# Patient Record
Sex: Male | Born: 1940
Health system: Southern US, Community
[De-identification: ages and names within clinical notes are randomized; demographics above are authoritative.]

## PROBLEM LIST (undated history)

## (undated) DIAGNOSIS — F329 Major depressive disorder, single episode, unspecified: Secondary | ICD-10-CM

## (undated) DIAGNOSIS — R0600 Dyspnea, unspecified: Secondary | ICD-10-CM

## (undated) DIAGNOSIS — M1990 Unspecified osteoarthritis, unspecified site: Secondary | ICD-10-CM

## (undated) DIAGNOSIS — Z87442 Personal history of urinary calculi: Secondary | ICD-10-CM

## (undated) DIAGNOSIS — J189 Pneumonia, unspecified organism: Secondary | ICD-10-CM

## (undated) DIAGNOSIS — F419 Anxiety disorder, unspecified: Secondary | ICD-10-CM

## (undated) DIAGNOSIS — J449 Chronic obstructive pulmonary disease, unspecified: Secondary | ICD-10-CM

## (undated) DIAGNOSIS — C801 Malignant (primary) neoplasm, unspecified: Secondary | ICD-10-CM

## (undated) DIAGNOSIS — G4730 Sleep apnea, unspecified: Secondary | ICD-10-CM

## (undated) DIAGNOSIS — F32A Depression, unspecified: Secondary | ICD-10-CM

## (undated) DIAGNOSIS — E785 Hyperlipidemia, unspecified: Secondary | ICD-10-CM

## (undated) HISTORY — DX: Hyperlipidemia, unspecified: E78.5

## (undated) HISTORY — DX: Chronic obstructive pulmonary disease, unspecified: J44.9

## (undated) HISTORY — PX: MOUTH SURGERY: SHX715

## (undated) HISTORY — PX: COLONOSCOPY W/ POLYPECTOMY: SHX1380

## (undated) HISTORY — DX: Sleep apnea, unspecified: G47.30

---

## 2004-08-20 ENCOUNTER — Ambulatory Visit: Payer: Self-pay | Admitting: Internal Medicine

## 2004-09-10 ENCOUNTER — Ambulatory Visit: Payer: Self-pay | Admitting: Internal Medicine

## 2004-09-23 ENCOUNTER — Ambulatory Visit: Payer: Self-pay | Admitting: Internal Medicine

## 2004-09-24 ENCOUNTER — Ambulatory Visit: Payer: Self-pay | Admitting: Internal Medicine

## 2004-10-01 ENCOUNTER — Ambulatory Visit: Payer: Self-pay | Admitting: Internal Medicine

## 2004-10-25 ENCOUNTER — Ambulatory Visit: Payer: Self-pay | Admitting: Internal Medicine

## 2004-11-12 ENCOUNTER — Ambulatory Visit: Payer: Self-pay | Admitting: Internal Medicine

## 2004-11-30 ENCOUNTER — Ambulatory Visit: Payer: Self-pay | Admitting: Internal Medicine

## 2004-12-29 ENCOUNTER — Ambulatory Visit: Payer: Self-pay | Admitting: Internal Medicine

## 2005-01-21 ENCOUNTER — Ambulatory Visit: Payer: Self-pay | Admitting: Internal Medicine

## 2005-02-04 ENCOUNTER — Ambulatory Visit: Payer: Self-pay | Admitting: Internal Medicine

## 2005-02-11 ENCOUNTER — Ambulatory Visit: Payer: Self-pay | Admitting: Internal Medicine

## 2005-02-25 ENCOUNTER — Ambulatory Visit: Payer: Self-pay | Admitting: Internal Medicine

## 2005-03-28 ENCOUNTER — Ambulatory Visit: Payer: Self-pay | Admitting: Internal Medicine

## 2005-04-05 ENCOUNTER — Ambulatory Visit: Payer: Self-pay | Admitting: Internal Medicine

## 2005-04-12 ENCOUNTER — Ambulatory Visit: Payer: Self-pay | Admitting: Internal Medicine

## 2005-04-20 ENCOUNTER — Ambulatory Visit: Payer: Self-pay | Admitting: Internal Medicine

## 2005-05-04 ENCOUNTER — Ambulatory Visit: Payer: Self-pay | Admitting: Internal Medicine

## 2005-05-13 ENCOUNTER — Ambulatory Visit: Payer: Self-pay | Admitting: Internal Medicine

## 2005-05-25 ENCOUNTER — Ambulatory Visit: Payer: Self-pay | Admitting: Internal Medicine

## 2005-06-13 ENCOUNTER — Ambulatory Visit: Payer: Self-pay | Admitting: Internal Medicine

## 2005-06-14 ENCOUNTER — Ambulatory Visit: Payer: Self-pay | Admitting: Internal Medicine

## 2005-07-04 ENCOUNTER — Ambulatory Visit: Payer: Self-pay | Admitting: Internal Medicine

## 2005-07-19 ENCOUNTER — Ambulatory Visit: Payer: Self-pay | Admitting: Internal Medicine

## 2005-08-22 ENCOUNTER — Ambulatory Visit: Payer: Self-pay | Admitting: Internal Medicine

## 2005-09-06 ENCOUNTER — Ambulatory Visit: Payer: Self-pay | Admitting: Internal Medicine

## 2005-09-20 ENCOUNTER — Ambulatory Visit: Payer: Self-pay | Admitting: Internal Medicine

## 2005-09-30 ENCOUNTER — Ambulatory Visit: Payer: Self-pay | Admitting: Internal Medicine

## 2005-10-10 ENCOUNTER — Ambulatory Visit: Payer: Self-pay | Admitting: Internal Medicine

## 2005-10-31 ENCOUNTER — Ambulatory Visit: Payer: Self-pay | Admitting: Internal Medicine

## 2005-11-08 ENCOUNTER — Ambulatory Visit: Payer: Self-pay | Admitting: Internal Medicine

## 2005-11-24 ENCOUNTER — Ambulatory Visit: Payer: Self-pay | Admitting: Internal Medicine

## 2005-12-02 ENCOUNTER — Ambulatory Visit: Payer: Self-pay | Admitting: Internal Medicine

## 2005-12-16 ENCOUNTER — Ambulatory Visit: Payer: Self-pay | Admitting: Internal Medicine

## 2006-01-06 ENCOUNTER — Ambulatory Visit: Payer: Self-pay | Admitting: Internal Medicine

## 2006-02-10 ENCOUNTER — Ambulatory Visit: Payer: Self-pay | Admitting: Internal Medicine

## 2006-02-20 ENCOUNTER — Encounter (INDEPENDENT_AMBULATORY_CARE_PROVIDER_SITE_OTHER): Payer: Self-pay | Admitting: *Deleted

## 2006-02-20 ENCOUNTER — Ambulatory Visit (HOSPITAL_BASED_OUTPATIENT_CLINIC_OR_DEPARTMENT_OTHER): Admission: RE | Admit: 2006-02-20 | Discharge: 2006-02-20 | Payer: Self-pay | Admitting: Urology

## 2006-02-24 ENCOUNTER — Ambulatory Visit: Payer: Self-pay | Admitting: Internal Medicine

## 2006-03-03 ENCOUNTER — Ambulatory Visit: Payer: Self-pay | Admitting: Internal Medicine

## 2006-04-05 ENCOUNTER — Ambulatory Visit: Payer: Self-pay | Admitting: Internal Medicine

## 2006-04-06 ENCOUNTER — Ambulatory Visit: Payer: Self-pay | Admitting: Internal Medicine

## 2006-04-12 ENCOUNTER — Ambulatory Visit: Payer: Self-pay | Admitting: Internal Medicine

## 2006-04-24 ENCOUNTER — Ambulatory Visit: Payer: Self-pay | Admitting: Internal Medicine

## 2006-05-30 ENCOUNTER — Ambulatory Visit (HOSPITAL_COMMUNITY): Admission: RE | Admit: 2006-05-30 | Discharge: 2006-05-30 | Payer: Self-pay | Admitting: Internal Medicine

## 2006-06-05 ENCOUNTER — Ambulatory Visit: Payer: Self-pay | Admitting: Internal Medicine

## 2006-07-10 ENCOUNTER — Ambulatory Visit: Payer: Self-pay | Admitting: Gastroenterology

## 2006-07-10 ENCOUNTER — Ambulatory Visit: Payer: Self-pay | Admitting: Internal Medicine

## 2006-07-24 ENCOUNTER — Ambulatory Visit: Payer: Self-pay | Admitting: Gastroenterology

## 2006-08-11 ENCOUNTER — Ambulatory Visit: Payer: Self-pay | Admitting: Internal Medicine

## 2006-08-24 ENCOUNTER — Ambulatory Visit: Payer: Self-pay | Admitting: Internal Medicine

## 2006-09-01 ENCOUNTER — Ambulatory Visit: Payer: Self-pay | Admitting: Internal Medicine

## 2006-09-07 ENCOUNTER — Ambulatory Visit: Payer: Self-pay | Admitting: Gastroenterology

## 2006-09-08 ENCOUNTER — Ambulatory Visit: Payer: Self-pay | Admitting: Internal Medicine

## 2006-09-15 ENCOUNTER — Ambulatory Visit: Payer: Self-pay | Admitting: Internal Medicine

## 2006-09-22 ENCOUNTER — Ambulatory Visit: Payer: Self-pay | Admitting: Internal Medicine

## 2006-09-29 ENCOUNTER — Ambulatory Visit: Payer: Self-pay | Admitting: Internal Medicine

## 2006-10-06 ENCOUNTER — Ambulatory Visit: Payer: Self-pay | Admitting: Internal Medicine

## 2006-10-13 ENCOUNTER — Ambulatory Visit: Payer: Self-pay | Admitting: Internal Medicine

## 2006-10-20 ENCOUNTER — Ambulatory Visit: Payer: Self-pay | Admitting: Internal Medicine

## 2006-11-17 ENCOUNTER — Ambulatory Visit: Payer: Self-pay | Admitting: Internal Medicine

## 2006-11-24 ENCOUNTER — Ambulatory Visit: Payer: Self-pay | Admitting: Internal Medicine

## 2006-12-08 ENCOUNTER — Ambulatory Visit: Payer: Self-pay | Admitting: Internal Medicine

## 2006-12-15 ENCOUNTER — Ambulatory Visit: Payer: Self-pay | Admitting: Internal Medicine

## 2007-01-16 ENCOUNTER — Ambulatory Visit: Payer: Self-pay | Admitting: Internal Medicine

## 2007-01-25 ENCOUNTER — Ambulatory Visit: Payer: Self-pay | Admitting: Internal Medicine

## 2007-02-02 ENCOUNTER — Ambulatory Visit: Payer: Self-pay | Admitting: Internal Medicine

## 2007-02-13 ENCOUNTER — Ambulatory Visit: Payer: Self-pay | Admitting: Internal Medicine

## 2007-03-02 ENCOUNTER — Ambulatory Visit: Payer: Self-pay | Admitting: Internal Medicine

## 2007-03-27 ENCOUNTER — Ambulatory Visit: Payer: Self-pay | Admitting: Internal Medicine

## 2007-04-05 ENCOUNTER — Ambulatory Visit: Payer: Self-pay | Admitting: Internal Medicine

## 2007-04-11 ENCOUNTER — Ambulatory Visit: Payer: Self-pay | Admitting: Internal Medicine

## 2007-04-20 ENCOUNTER — Ambulatory Visit: Payer: Self-pay | Admitting: Internal Medicine

## 2007-05-04 ENCOUNTER — Ambulatory Visit: Payer: Self-pay | Admitting: Internal Medicine

## 2007-05-15 ENCOUNTER — Ambulatory Visit: Payer: Self-pay | Admitting: Internal Medicine

## 2007-05-28 ENCOUNTER — Ambulatory Visit: Payer: Self-pay | Admitting: Internal Medicine

## 2007-06-19 ENCOUNTER — Ambulatory Visit: Payer: Self-pay | Admitting: Internal Medicine

## 2007-06-29 ENCOUNTER — Ambulatory Visit: Payer: Self-pay | Admitting: Internal Medicine

## 2007-07-20 ENCOUNTER — Ambulatory Visit: Payer: Self-pay | Admitting: Internal Medicine

## 2007-08-24 ENCOUNTER — Ambulatory Visit: Payer: Self-pay | Admitting: Internal Medicine

## 2007-09-07 ENCOUNTER — Ambulatory Visit: Payer: Self-pay | Admitting: Internal Medicine

## 2007-09-13 ENCOUNTER — Telehealth (INDEPENDENT_AMBULATORY_CARE_PROVIDER_SITE_OTHER): Payer: Self-pay | Admitting: *Deleted

## 2007-09-21 ENCOUNTER — Ambulatory Visit: Payer: Self-pay | Admitting: Internal Medicine

## 2007-11-02 ENCOUNTER — Ambulatory Visit: Payer: Self-pay | Admitting: Internal Medicine

## 2007-11-16 ENCOUNTER — Ambulatory Visit: Payer: Self-pay | Admitting: Internal Medicine

## 2007-12-07 ENCOUNTER — Ambulatory Visit: Payer: Self-pay | Admitting: Internal Medicine

## 2008-01-11 ENCOUNTER — Ambulatory Visit: Payer: Self-pay | Admitting: Internal Medicine

## 2008-02-08 ENCOUNTER — Ambulatory Visit: Payer: Self-pay | Admitting: Internal Medicine

## 2008-02-29 ENCOUNTER — Ambulatory Visit: Payer: Self-pay | Admitting: Internal Medicine

## 2008-04-09 DIAGNOSIS — J45909 Unspecified asthma, uncomplicated: Secondary | ICD-10-CM | POA: Insufficient documentation

## 2008-04-09 DIAGNOSIS — J449 Chronic obstructive pulmonary disease, unspecified: Secondary | ICD-10-CM | POA: Insufficient documentation

## 2008-04-09 DIAGNOSIS — F17200 Nicotine dependence, unspecified, uncomplicated: Secondary | ICD-10-CM

## 2008-04-09 DIAGNOSIS — J309 Allergic rhinitis, unspecified: Secondary | ICD-10-CM | POA: Insufficient documentation

## 2008-04-10 ENCOUNTER — Ambulatory Visit: Payer: Self-pay | Admitting: Internal Medicine

## 2008-04-25 ENCOUNTER — Ambulatory Visit: Payer: Self-pay | Admitting: Internal Medicine

## 2008-04-30 ENCOUNTER — Encounter: Payer: Self-pay | Admitting: Pulmonary Disease

## 2008-04-30 ENCOUNTER — Ambulatory Visit (HOSPITAL_BASED_OUTPATIENT_CLINIC_OR_DEPARTMENT_OTHER): Admission: RE | Admit: 2008-04-30 | Discharge: 2008-04-30 | Payer: Self-pay | Admitting: Internal Medicine

## 2008-05-09 ENCOUNTER — Ambulatory Visit: Payer: Self-pay | Admitting: Pulmonary Disease

## 2008-05-15 ENCOUNTER — Ambulatory Visit: Payer: Self-pay | Admitting: Internal Medicine

## 2008-05-26 ENCOUNTER — Encounter (INDEPENDENT_AMBULATORY_CARE_PROVIDER_SITE_OTHER): Payer: Self-pay | Admitting: *Deleted

## 2008-05-27 ENCOUNTER — Telehealth (INDEPENDENT_AMBULATORY_CARE_PROVIDER_SITE_OTHER): Payer: Self-pay | Admitting: *Deleted

## 2008-05-27 ENCOUNTER — Ambulatory Visit: Payer: Self-pay | Admitting: Internal Medicine

## 2008-06-06 ENCOUNTER — Ambulatory Visit: Payer: Self-pay | Admitting: Internal Medicine

## 2008-06-06 ENCOUNTER — Telehealth: Payer: Self-pay | Admitting: Internal Medicine

## 2008-06-11 ENCOUNTER — Ambulatory Visit: Payer: Self-pay | Admitting: Pulmonary Disease

## 2008-06-11 DIAGNOSIS — G4730 Sleep apnea, unspecified: Secondary | ICD-10-CM

## 2008-06-12 ENCOUNTER — Telehealth (INDEPENDENT_AMBULATORY_CARE_PROVIDER_SITE_OTHER): Payer: Self-pay | Admitting: *Deleted

## 2008-06-13 ENCOUNTER — Ambulatory Visit: Payer: Self-pay | Admitting: Internal Medicine

## 2008-06-17 ENCOUNTER — Encounter: Payer: Self-pay | Admitting: Pulmonary Disease

## 2008-06-19 ENCOUNTER — Ambulatory Visit: Payer: Self-pay | Admitting: Internal Medicine

## 2008-06-26 ENCOUNTER — Ambulatory Visit: Payer: Self-pay | Admitting: Internal Medicine

## 2008-07-07 ENCOUNTER — Encounter: Payer: Self-pay | Admitting: Pulmonary Disease

## 2008-07-14 ENCOUNTER — Telehealth: Payer: Self-pay | Admitting: Pulmonary Disease

## 2008-09-08 ENCOUNTER — Encounter: Payer: Self-pay | Admitting: Pulmonary Disease

## 2008-12-18 ENCOUNTER — Ambulatory Visit: Payer: Self-pay | Admitting: Pulmonary Disease

## 2009-01-15 ENCOUNTER — Encounter: Payer: Self-pay | Admitting: Pulmonary Disease

## 2009-02-03 ENCOUNTER — Telehealth (INDEPENDENT_AMBULATORY_CARE_PROVIDER_SITE_OTHER): Payer: Self-pay | Admitting: *Deleted

## 2009-03-04 ENCOUNTER — Encounter: Payer: Self-pay | Admitting: Pulmonary Disease

## 2009-03-11 ENCOUNTER — Ambulatory Visit (HOSPITAL_BASED_OUTPATIENT_CLINIC_OR_DEPARTMENT_OTHER): Admission: RE | Admit: 2009-03-11 | Discharge: 2009-03-11 | Payer: Self-pay | Admitting: Surgery

## 2009-03-11 ENCOUNTER — Encounter (INDEPENDENT_AMBULATORY_CARE_PROVIDER_SITE_OTHER): Payer: Self-pay | Admitting: Surgery

## 2009-04-29 ENCOUNTER — Telehealth: Payer: Self-pay | Admitting: Internal Medicine

## 2009-07-29 ENCOUNTER — Telehealth: Payer: Self-pay | Admitting: Internal Medicine

## 2009-08-15 HISTORY — PX: OTHER SURGICAL HISTORY: SHX169

## 2010-01-21 ENCOUNTER — Telehealth: Payer: Self-pay | Admitting: Internal Medicine

## 2010-08-15 HISTORY — PX: CYSTOSCOPY: SUR368

## 2010-09-14 NOTE — Miscellaneous (Signed)
Summary: Injection record/ [facility]  Injection record/ [facility]   Imported By: Sherian Rein 01/05/2010 11:42:15  _____________________________________________________________________  External Attachment:    Type:   Image     Comment:   External Document

## 2010-09-14 NOTE — Progress Notes (Signed)
Summary: appt  Phone Note Call from Patient Call back at Home Phone [phone]   Caller: Spouse//phyllis Call For: Waleska Buttery Summary of Call: States pt  c/o coughing, loose phlegm, "just feels terrible" wants to be seen by CY today. Initial call taken by: Darletta Moll,  January 21, 2010 11:56 AM  Follow-up for Phone Call        Pt c/o sore throat, nasal drainage, productive cough with yellow phlegm on and off x 2 months. He is requesting an appt this pm if possible. If not then ok to call in rx. Please advise. Carron Curie CMA  January 21, 2010 1:01 PM allergies:PCN  Additional Follow-up for Phone Call Additional follow up Details #1::        Per CDY-give Biaxin 500mg  #14 take 1 by mouth two times a day no refills.Reynaldo Minium CMA  January 21, 2010 2:49 PM   rx sent. pt aware of recs. Carron Curie CMA  January 21, 2010 3:07 PM     New/Updated Medications: BIAXIN 500 MG TABS (CLARITHROMYCIN) Take 1 tablet by mouth two times a day Prescriptions: BIAXIN 500 MG TABS (CLARITHROMYCIN) Take 1 tablet by mouth two times a day  #14 x 0   Entered by:   Carron Curie CMA   Authorized by:   Waymon Budge MD   Signed by:   Carron Curie CMA on 01/21/2010   Method used:   Electronically to        [facility] (retail)       [address]       [place], [place]  [address]       Ph: [phone] or [phone]       Fax: [phone]   RxID:   [id number]

## 2010-09-14 NOTE — Miscellaneous (Signed)
Summary: Injection Record/[facility]  Injection Record/[facility]   Imported By: Sherian Rein 01/05/2010 13:52:42  _____________________________________________________________________  External Attachment:    Type:   Image     Comment:   External Document

## 2010-12-28 NOTE — Assessment & Plan Note (Signed)
[facility]                             PULMONARY OFFICE NOTE   LEANORD, THIBEAU                        MRN:          [id number]  DATE:04/11/2007                            DOB:          11/07/1940    PROBLEMS:  1. Asthma/chronic obstructive pulmonary disease.  2. Tobacco use.  3. Allergic rhinitis.   HISTORY:  Still smoking some.  Complains of postnasal drainage.  Had  episode of diverticulitis which was treated.  Currently feels she is  doing pretty well with her breathing.  No chest pain, significant cough  or phlegm.   MEDICATIONS:  Allergy vaccine continues here at 1:10 and she wants to  continue, Flomax, Lialda, Pamine Forte, Allegra D, 12-hour; occasional  Levsin, and Tylenol.   ALLERGIES:  PENICILLIN.   OBJECTIVE:  VITAL SIGNS:  Weight 179 pounds.  Blood pressure 130/82,  pulse 92, room air saturation 96%.  HEENT:  There is minimal nasal turbinate edema.  She does not sound  stuffy.  There is no periorbital edema and no visible drainage.  CHEST:  Clear with breath sounds a little diminished.  HEART:  Heart sounds are regular and normal.  No adenopathy.   IMPRESSION:  1. Asthma/chronic obstructive pulmonary disease.  2. Tobacco abuse.  3. Rhinitis.   PLAN:  Smoking cessation was emphasized.  Schedule return in 1 year,  earlier p.r.n.  Note, chest x-ray in January had shown stable slight  chronic bronchitis.  PFT in February had shown moderate obstructive  disease.     Clinton D. Maple Hudson, MD, Tonny Bollman, FACP  Electronically Signed    CDY/MedQ  DD: 04/16/2007  DT: 04/16/2007  Job #: [id number]   cc:   Larina Earthly, M.D.

## 2010-12-28 NOTE — Procedures (Signed)
[name], [name] NO.:  [id number]   MEDICAL RECORD NO.:  [id number]          PATIENT TYPE:  OUT   LOCATION:  SLEEP CENTER                 FACILITY:  [facility]   PHYSICIAN:  Oretha Milch, MD      DATE OF BIRTH:  [birth year]   DATE OF STUDY:  04/30/2008                            NOCTURNAL POLYSOMNOGRAM   REFERRING PHYSICIAN:  Larina Earthly, M.D.   REFERRING PHYSICIAN:  Larina Earthly, M.D.   INDICATIONS FOR THE STUDY:  [name] is a 70 year old gentleman with  excessive daytime somnolence, loud snoring, non refreshing sleep.  At  the time of the study his weight is 183 pounds, height 5 feet 6 inches,  BMI of 30 and a neck size of 17 inches.   EPWORTH SLEEPINESS SCORE:   MEDICATIONS:  Note that lorazepam was taken on the night of the study at  10 p.m.   SLEEP ARCHITECTURE:  This nocturnal polysomnogram was performed with a  sleep technologist in attendance.  Respiratory parameters were recorded.  Sleep stages, limb movements and respiratory data were scored according  to criteria laid out by the American Academy of Sleep Medicine.   Lights out was 10:56 p.m.  Lights on was 4:54 a.m.  Sleep latency was  48.5 minutes, total sleep time of 225 minutes, and the sleep period time  was 310 minutes.  Latency to REM sleep was 239 minutes.  Sleep stages as  a percentage of total sleep was M1 4%, M2 91%, M3 0% and REM sleep 4.7%  (10.5 minutes).  He did not sleep in the supine position.   AROUSAL DATA:  There were a total of 205 arousals with an arousal index  of 54.7 event per hour.  Of these, 167 were spontaneous, 24 were  associated with periodic limb movements, and 14 were related to sleep  disordered breathing.   RESPIRATORY DATA:  There were 0 obstructive apnea, 0 central apneas, 0  mixed apneas, 21 hypopneas, and 97 RERAs with an apnea hypopnea index of  5.6 events per hour, and an RDI of 31.5 events per hour.  The longest  hypopnea was 39 seconds.   OXYGEN DATA:   The desaturation index was 80 events per hour.  The lowest  desaturation was 84%.  He spent 27 minutes with a saturation less than  88%.   PERIODIC LIMB MOVEMENT DATA:  The periodic limb movement index was 13.9  events per hour with a PLM arousal index of 6.4 events per hour.   CARDIAC DATA:  The lowest heart rate was 44 beats per minute.  The high  heart rate was artifactual.   DISCUSSION:  No supine sleep was noticed, only 10 minutes of REM sleep  was seen in less than 4 hours of sleep.  He does have a degree sleep  disordered breathing maybe worse than what is projected from the study.   MOVEMENT-PARASOMNIA:   IMPRESSION:  1. Mild-to-moderate sleep disordered breathing with predominant      hypopneas and respiratory event-related arousals causing sleep      fragmentation and oxygen desaturation.  2. Significant periodic limb movements were noted  with arousals.  3. Only 10 minutes of rapid eye movement sleep was noted.  No supine      sleep was seen.  4. No evidence of cardiac arrhythmias or behavioral disturbance during      sleep.   RECOMMENDATIONS:  1. The treatment options for this degree of sleep disordered breathing      would be weight loss, oral appliances, continuous positive airway      pressure therapy, or upper airway surgery.  2. He should be asked to avoid driving when sleepy and avoid      medications with sedative side effects.      Oretha Milch, MD  Electronically Signed     RVA/MEDQ  D:  05/09/2008 14:46:26  T:  05/09/2008 18:44:06  Job:  [id number]   cc:   Larina Earthly, M.D.  Fax: [phone]

## 2010-12-28 NOTE — Op Note (Signed)
[name], [name] NO.:  [id number]   MEDICAL RECORD NO.:  [id number]          PATIENT TYPE:  AMB   LOCATION:  [facility]                          FACILITY:  [facility]   PHYSICIAN:  Currie Paris, M.D.DATE OF BIRTH:  02-20-1941   DATE OF PROCEDURE:  03/11/2009  DATE OF DISCHARGE:                               OPERATIVE REPORT   PREOPERATIVE DIAGNOSES:  1. Sebaceous cyst, left face anterior to ear.  2. Sebaceous cyst, left posterior shoulder area.  3. Lipoma of upper mid back.   POSTOPERATIVE DIAGNOSES:  1. Sebaceous cyst, left face anterior to ear.  2. Sebaceous cyst, left posterior shoulder area.  3. Lipoma of upper mid back.   OPERATION:  Excision of epidermoid cyst x2 and lipoma x1.   SURGEON:  Currie Paris, MD   ANESTHESIA:  General.   CLINICAL HISTORY:  This patient has had these lesions gradually  enlarging.  He has had a prior cyst removed under local many many years  ago and declined to have anything done under local because of the bad  experience he had with that.  In addition, I thought that the lipomas  lesion was going to be too large and too difficult to get out with  straight local.  We therefore planned to do this under general  anesthesia.   DESCRIPTION OF PROCEDURE:  I saw the patient in the holding area and he  had no further questions.  We marked all 3 lesions for excision.   The patient was taken to the operating room, and after satisfactory  general anesthesia had been obtained, he was placed in the operating  room table prone.  In turning him, he did have a small abrasion occur on  his left arm, which was immediately dressed, and there was no need for  sutures as this appeared to be very superficial, and the patient had  quite thin skin.   Once this was done, the upper back was prepped, so that we could get the  shoulder and midline lipoma off.  Once this was done, the time-out was  performed.   I started with the lipoma.   I injected a combination of 1% Xylocaine  with epi and 0.25% plain Marcaine mixed equally all around the area.  I  made a transverse incision.  The lipomatous fat was bulging up and using  a combination of cautery and blunt dissection, I was able to excise this  in its entirety going down at one point to muscle.  I thought we got  this out intact.   I put more local in.  I tacked the skin where we had a cavity down to  the muscle to try to reduce the chance of a postoperative seroma.  I  then closed in layers with some 3-0 Vicryl, 3-0 Monocryl subcuticular,  and Dermabond.   I then approached the epidermoid cyst, which was likewise anesthetized  for postop pain relief.  Here, I could see a punctum and I made an  elliptical incision to include that.  Using the knife, I excised this  intact and in toto.  Again, I made sure everything was dry and put a  little more local in into the deeper tissues and closed this in layers  with 3-0 Vicryl and 3-0 Monocryl subcuticular plus Dermabond.   Attention was then turned to the facial lesion, which was prepped  separately.  I was able to get to this with the patient just tilted on  his side and his face turned a little bit, but still in the prone  position.  This was anesthetized again with the same local.  I made a  vertical incision what looked like a skin fold and identified the  epidermoid cyst and sharply excised that staying right on the cyst, so  we did not get into any deeper tissues.  This was closed in a similar  fashion with 3-0 Vicryl, 4-0 Monocryl subcuticular, and Dermabond.   The patient tolerated the procedure well.  There were no operative  complications other than the abrasion noted above.  All counts were  correct.      Currie Paris, M.D.  Electronically Signed     CJS/MEDQ  D:  03/11/2009  T:  03/12/2009  Job:  [id number]

## 2010-12-31 NOTE — Assessment & Plan Note (Signed)
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 At that time, he had rather extensive  diverticulosis with thickened, red, haustral folds in the sigmoid colon  and some small minor polyps that were excised and obliterated. He comes  back to the office today for evaluation and discussion of his problems.   The patient has had recurrent suprapubic and left lower quadrant pain  for several years with mild constipation, gas and bloating. He had CT  scan of the abdomen in October of 2007 which showed sigmoid colon  thickening and adjacent inflammation consistent with diverticulitis. At  that time, he was treated appropriately by Dr. Marcello Moores for diverticulitis.  The patient currently is having fairly regular bowel habits and is on a  high-fiber diet.   His past medical history is fairly noncontributory, and he really is on  no medications except for Vytorin and p.r.n. Allegra D and Tylenol.   HE DENIES DRUG ALLERGIES EXCEPT FOR PENICILLIN.   His appetite is good, and his weight is stable. He denies upper GI or  hepatobiliary complaints at this time.   His family history is remarkable for colon cancer in his brother.   SOCIAL HISTORY:  He is married and lives with his wife and has a high  school education. He works as a Museum/gallery exhibitions officer. He does smoke. Denies  ethanol abuse.   REVIEW OF SYSTEMS:  Remarkable for frequent nonproductive cough, low-  grade night sweats, chronic fatigue. He denies other cardiovascular or  pulmonary complaints.   Exam today shows him to be 5 feet 6 inches tall, weight 181 pounds.  Blood pressure 130/82 and pulse was 80 and regular.  I could not appreciate stigmata of chronic liver disease. His abdominal  exam showed no organomegaly, masses and some subjective tenderness in  the suprapubic area but no rebound tenderness.  Rectal exam was deferred.   ASSESSMENT:  I think [name] has symptomatic diverticulosis and may  have a focal segmental colitis in his sigmoid colon per his problems and  difficulties in previous workup including his CT scan and colonoscopy.   1. High-fiber diet with daily Benefiber and liberal p.o. fluids.  2. Trial of Lialda 2.5 g p.o. q.a.m.  3. P.r.n. sublingual Levsin 0.125 mg every 6 to 8 hours as needed.  4. GI follow up in 2 months' time or p.r.n. as needed.     Vania Rea. Jarold Motto, MD, Caleen Essex, FAGA  Electronically Signed    DRP/MedQ  DD: 09/07/2006  DT: 09/07/2006  Job #: [id number]   cc:   Larina Earthly, M.D.

## 2010-12-31 NOTE — Assessment & Plan Note (Signed)
[facility]                             PULMONARY OFFICE NOTE   [name], Butler                        MRN:          [id number]  DATE:09/01/2006                            DOB:          [birth year]    PROBLEMS:  1. Asthma/chronic obstructive pulmonary disease.  2. Tobacco use.  3. Allergic rhinitis.   HISTORY:  Last here in June 2006. He comes now to maintain contact. He  continues allergy vaccine here at 1-10. Only in the past week has he  noted rhinorrhea then much cough reducing white mucus, tussive bilateral  rib soreness. He says he smokes, but less. We talked again about  available techniques and support. He says he had not been using his  inhaler at all. He had a CAT scan of the abdomen and pelvis, associated  with diverticulitis, which resolved. He continues allergy vaccine here  at 1-10 and feels it is helpful.   MEDICATIONS:  1. Vytorin.  2. Allegra-D 12 hour.   OBJECTIVE:  Weight 177 pounds, blood pressure 128/82, pulse regular 90,  room air saturation 95%. Wheezy, congested cough with bilateral breath  sounds. No dullness or rub. Heart sounds regular without murmur. There  is no neck vein distension or edema. I do not find adenopathy.   IMPRESSION:  Acute bronchitic exacerbation of his chronic asthma with  chronic obstructive pulmonary disease.   PLAN:  1. Nebulizer treatment here, Xopenex 1.25 mg.  2. Depo-Medrol 80 mg i. m.  3. Prednisone 8 day taper from 40 mg and steroid talk.  4. Chest x-ray.  5. Schedule pulmonary function test.  6. Refill Allegra-D 12 hour for b.i.d. p.r.n. use after discussion.  7. Prolonged discussion on smoking cessation.  8. Schedule return 6 weeks, earlier p.r.n.     David D. Maple Hudson, MD, Tonny Bollman, FACP  Electronically Signed    CDY/MedQ  DD: 09/01/2006  DT: 09/02/2006  Job #: [id number]   cc:   [name], M.D.

## 2010-12-31 NOTE — Op Note (Signed)
NAME:  [name], [name] NO.:  [id number]   MEDICAL RECORD NO.:  [id number]          PATIENT TYPE:  AMB   LOCATION:  [facility]                         FACILITY:  [facility]   PHYSICIAN:  Martina Sinner, MD DATE OF BIRTH:  01/22/1941   DATE OF PROCEDURE:  02/20/2006  DATE OF DISCHARGE:                                 OPERATIVE REPORT   PREOPERATIVE DIAGNOSIS:  Bladder lesion and pelvic pain with urinary  frequency.   POSTOPERATIVE DIAGNOSIS:  Bladder lesion and pelvic pain with urinary  frequency.   SURGEON:  Cystoscopy, bladder hydrodistention, bladder installation and  fulguration of bladder neck.   Mr. Sikora presented with recurrent pelvic pain and frequency with a working  diagnosis of prostatitis.  He had what appeared to be a varicose vein-like  lesion on cystoscopy but with him awake it was difficult to do the  procedure.  He was being cystoscoped today to have a better look at the  lesion and the biopsy and do a resection if needed.   The patient is prepped and draped in the usual fashion.  He was given  ciprofloxacin prior to the procedure.   A 21-French cystoscope was used for the examination.  Penile, bulbar,  membranous and prostatic urethra were normal.  He had minimal elevation of  the bladder neck.  On examination of the bladder with the 30 and 70 degrees  lens, he had prominent blood vessels throughout the bladder but prominent  veins near the right ureteral orifice and throughout the trigone and near  the bladder neck.  These almost looked like varicose veins and certainly did  not look like a malignancy.  A photograph was taken.  With normal saline,  the urine was barbotaged and the urine was sent for cytology.  I had a good  look throughout the bladder and there was no obvious carcinoma in situ.  The  bladder neck was bleeding from rubbing against one of the prominent veins.  This was fulgurated easily at the end of the case.  I did not feel that  a  biopsy was necessary.   Because of his pelvic pain and frequency, they did a bladder  hydrodistention.  I only could distend him to approximately 500 mL which is  a little bit surprising for a man of his age.  On reinspection of the  bladder, there was no glomerulations or findings in keeping with a diagnosis  of interstitial cystitis.  I decided to instill 15 mL of 0.5% Marcaine in  400 mg of Pyridium. A B&O suppository was also inserted.   In my opinion, Mr. Rogus does not have a malignancy.  If his cytology is  normal,  I will clear him from this regard.  I will start treating him for  prostatitis.  It would be interesting to see if he gets any relief from the  hydrodistention and bladder installation therapy.           ______________________________  Martina Sinner, MD  Electronically Signed     SAM/MEDQ  D:  02/20/2006  T:  02/20/2006  Job:  [id number]

## 2010-12-31 NOTE — Assessment & Plan Note (Signed)
[facility]                             PULMONARY OFFICE NOTE   KALE, RONDEAU                        MRN:          [id number]  DATE:10/13/2006                            DOB:          1941-02-07    PULMONARY FOLLOWUP   PROBLEMS:  1. Asthma/chronic obstructive pulmonary disease.  2. Tobacco use.  3. Allergic rhinitis.   HISTORY:  He continues to smoke.  He complains of much post-nasal drip  all winter, coughing up some trace yellow-to-white sputum, but no acute  events, blood, adenopathy, or chest pain.  He had seen Dr. Sheryn Bison for GI evaluation with the impression of diverticulosis and  possible focal segmental colitis.   MEDICATIONS:  1. Allergy vaccine at 1:10.  2. Vytorin.  3. Flomax.  4. Lialda (fiber) 1200 mg x2.  5. Pamine Forte 5 mg b.i.d.  6. Occasional Allegra D 12 hour.  7. Occasional Tylenol.  8. P.r.n. sublingual Levsin 0.125 mg.   OBJECTIVE:  Weight 185 pounds, BP 150/96, pulse regular 80, room air  saturation 95%.  There may be minimal nasal stuffiness.  Otherwise, nasopharynx is not  remarkable.  CHEST:  Clear.  HEART:  Sounds regular without murmur.  I do not find adenopathy.   IMPRESSION:  1. Allergic rhinitis.  2. Asthma/chronic obstructive pulmonary disease with most significant      factor being ongoing tobacco use.   PLAN:  1. Smoking cessation was discussed carefully.  A booklet and      prescription for Chantix were provided with encouragement.  2. Try changing Allegra D to Zyrtec 10 mg daily p.r.n.  3. Resume Nasonex 1 to 2 sprays each nostril daily.  4. Schedule return in 6 months, earlier p.r.n.   ADDENDUM:  Results of PFT are now available from this visit showing  moderate obstructive airways disease with minimal response to  bronchodilator.  FEV1 was 1.70 (66%) after dilator.  FVC was 3.07 (83%)  with a ratio of 0.55.  There was mild air trapping with a residual  volume of 126.  Diffusion  was mildly reduced at 67% of predicted.  The  scores would suggest moderate obstructive airway disease with an  emphysema component.     Clinton D. Maple Hudson, MD, Tonny Bollman, FACP  Electronically Signed    CDY/MedQ  DD: 10/14/2006  DT: 10/14/2006  Job #: [id number]   cc:   Larina Earthly, M.D.

## 2011-05-05 ENCOUNTER — Ambulatory Visit
Admission: RE | Admit: 2011-05-05 | Discharge: 2011-05-05 | Disposition: A | Discharge status: Home / Self Care | Payer: Medicare Other | Source: Ambulatory Visit | Attending: Urology | Admitting: Urology

## 2011-05-05 ENCOUNTER — Other Ambulatory Visit: Payer: Self-pay | Admitting: Urology

## 2011-05-05 DIAGNOSIS — Z01811 Encounter for preprocedural respiratory examination: Secondary | ICD-10-CM

## 2011-05-11 ENCOUNTER — Ambulatory Visit (HOSPITAL_BASED_OUTPATIENT_CLINIC_OR_DEPARTMENT_OTHER)
Admission: RE | Admit: 2011-05-11 | Discharge: 2011-05-11 | Disposition: A | Discharge status: Home / Self Care | Payer: Medicare Other | Source: Ambulatory Visit | Attending: Urology | Admitting: Urology

## 2011-05-11 ENCOUNTER — Other Ambulatory Visit: Payer: Self-pay | Admitting: Urology

## 2011-05-11 DIAGNOSIS — N3289 Other specified disorders of bladder: Secondary | ICD-10-CM | POA: Insufficient documentation

## 2011-05-11 DIAGNOSIS — R310 Gross hematuria: Secondary | ICD-10-CM | POA: Insufficient documentation

## 2011-05-11 DIAGNOSIS — Z01812 Encounter for preprocedural laboratory examination: Secondary | ICD-10-CM | POA: Insufficient documentation

## 2011-05-11 DIAGNOSIS — G4733 Obstructive sleep apnea (adult) (pediatric): Secondary | ICD-10-CM | POA: Insufficient documentation

## 2011-05-11 DIAGNOSIS — I999 Unspecified disorder of circulatory system: Secondary | ICD-10-CM | POA: Insufficient documentation

## 2011-05-11 DIAGNOSIS — N508 Other specified disorders of male genital organs: Secondary | ICD-10-CM | POA: Insufficient documentation

## 2011-05-11 DIAGNOSIS — R9431 Abnormal electrocardiogram [ECG] [EKG]: Secondary | ICD-10-CM | POA: Insufficient documentation

## 2011-05-20 NOTE — Op Note (Signed)
NAMEMarland Kitchen  [name], [name] NO.:  [id number]  MEDICAL RECORD NO.:  [id number]  LOCATION:                                 FACILITY:  PHYSICIAN:  Natalia Leatherwood, MD         DATE OF BIRTH:  DATE OF PROCEDURE:  05/11/2011 DATE OF DISCHARGE:                              OPERATIVE REPORT   SURGEON:  Natalia Leatherwood, MD  PREOPERATIVE DIAGNOSIS:  Gross hematuria.  POSTOPERATIVE DIAGNOSES:  Gross hematuria, bladder hypervascularity, left ejaculatory duct stone.  PROCEDURES:  Cystoscopy, bladder biopsy, bilateral retrograde pyelogram, fulguration of biopsy sites.  FINDINGS:  A left ejaculatory duct stone.  Bladder hypervascularity.  No filling defects on bilateral retrograde pyelograms.  Good excretion of contrast from the collecting systems bilaterally.  SPECIMENS:  Bladder biopsy x5 were sent separately from the right lateral bladder wall, left lateral bladder wall, bladder base, posterior bladder wall, and left bladder wall lateral to the left ureter.  ESTIMATED BLOOD LOSS:  Minimal.  COMPLICATIONS:  None.  HISTORY OF PRESENT ILLNESS:  This is a 70 year old smoker who has a history of gross hematuria.  He had a previous cystoscopy, but no biopsy of bladder hypervascularity.  He had a repeat episode of gross hematuria and was counseled in the clinic regarding options.  He elected to proceed to the operating room for bladder biopsy.  DESCRIPTION OF PROCEDURE:  Informed consent was obtained, the patient was taken to the operating room where he was placed in the supine position.  IV antibiotics were infused and general anesthesia was induced.  He was then placed in dorsal lithotomy position to make sure to pad all pertinent neurovascular pressure points appropriately.  After this was done, his genitals were prepped and draped in the usual sterile fashion.  A rigid cystoscope was advanced through the urethra.  In the prostatic urethra and it was noted that he had a  left ejaculatory duct stone.  This was not causing any problems and because he was not specifically consented for manipulation of the stone and we did not previously know about it I did not remove the stone.  I proceeded on into the bladder where it was drained and then evaluated systematically with a 12-degree, 30-degree and 70-degree lens.  After this was done, there was found to be hypervascularity throughout the bladder.  Even manipulation with the cystoscope caused bleeding.  Biopsies were taken from the right and left lateral bladder walls as well as the posterior bladder wall and bladder base.  Another area of vascularity located lateral to the left ureter was also obtained.  These spots were fulgurated with a Bugbee electrode to maintain hemostasis.  Next, a 5- [place] ureteral catheter was used to obtain retrograde pyelograms bilaterally.  There were no filling defects and both systems emptied out well.  The entirety of the urethra was visualized well.  Upon removing the scope, there were no concerning areas.  All 5 biopsies were sent separately.  An 18-French catheter with 10 mL of sterile water was placed in the balloon at the conclusion of the procedure and will be removed in the PACU.  The patient tolerated the procedure  well.  I will contact him with the results of the biopsies.          ______________________________ Natalia Leatherwood, MD     DW/MEDQ  D:  05/11/2011  T:  05/11/2011  Job:  [id number]  Electronically Signed by Natalia Leatherwood MD on 05/20/2011 08:41:47 PM

## 2011-05-24 ENCOUNTER — Ambulatory Visit (HOSPITAL_BASED_OUTPATIENT_CLINIC_OR_DEPARTMENT_OTHER): Admission: RE | Admit: 2011-05-24 | Payer: Medicare Other | Source: Ambulatory Visit | Admitting: Urology

## 2011-10-12 ENCOUNTER — Encounter: Payer: Self-pay | Admitting: Gastroenterology

## 2011-11-28 DIAGNOSIS — J449 Chronic obstructive pulmonary disease, unspecified: Secondary | ICD-10-CM | POA: Diagnosis not present

## 2011-11-28 DIAGNOSIS — E785 Hyperlipidemia, unspecified: Secondary | ICD-10-CM | POA: Diagnosis not present

## 2011-11-28 DIAGNOSIS — I10 Essential (primary) hypertension: Secondary | ICD-10-CM | POA: Diagnosis not present

## 2011-11-28 DIAGNOSIS — J309 Allergic rhinitis, unspecified: Secondary | ICD-10-CM | POA: Diagnosis not present

## 2012-01-19 DIAGNOSIS — H40019 Open angle with borderline findings, low risk, unspecified eye: Secondary | ICD-10-CM | POA: Diagnosis not present

## 2012-01-19 DIAGNOSIS — D492 Neoplasm of unspecified behavior of bone, soft tissue, and skin: Secondary | ICD-10-CM | POA: Diagnosis not present

## 2012-03-26 DIAGNOSIS — F17200 Nicotine dependence, unspecified, uncomplicated: Secondary | ICD-10-CM | POA: Diagnosis not present

## 2012-03-26 DIAGNOSIS — N138 Other obstructive and reflux uropathy: Secondary | ICD-10-CM | POA: Diagnosis not present

## 2012-03-26 DIAGNOSIS — I10 Essential (primary) hypertension: Secondary | ICD-10-CM | POA: Diagnosis not present

## 2012-03-26 DIAGNOSIS — J449 Chronic obstructive pulmonary disease, unspecified: Secondary | ICD-10-CM | POA: Diagnosis not present

## 2012-05-03 DIAGNOSIS — Z23 Encounter for immunization: Secondary | ICD-10-CM | POA: Diagnosis not present

## 2012-05-09 ENCOUNTER — Encounter: Payer: Self-pay | Admitting: Gastroenterology

## 2012-09-11 DIAGNOSIS — I10 Essential (primary) hypertension: Secondary | ICD-10-CM | POA: Diagnosis not present

## 2012-09-11 DIAGNOSIS — R82998 Other abnormal findings in urine: Secondary | ICD-10-CM | POA: Diagnosis not present

## 2012-09-11 DIAGNOSIS — E785 Hyperlipidemia, unspecified: Secondary | ICD-10-CM | POA: Diagnosis not present

## 2012-09-18 DIAGNOSIS — J449 Chronic obstructive pulmonary disease, unspecified: Secondary | ICD-10-CM | POA: Diagnosis not present

## 2012-09-18 DIAGNOSIS — I10 Essential (primary) hypertension: Secondary | ICD-10-CM | POA: Diagnosis not present

## 2012-09-18 DIAGNOSIS — I4510 Unspecified right bundle-branch block: Secondary | ICD-10-CM | POA: Diagnosis not present

## 2012-09-18 DIAGNOSIS — M545 Low back pain: Secondary | ICD-10-CM | POA: Diagnosis not present

## 2012-10-18 DIAGNOSIS — H43399 Other vitreous opacities, unspecified eye: Secondary | ICD-10-CM | POA: Diagnosis not present

## 2012-10-18 DIAGNOSIS — H40019 Open angle with borderline findings, low risk, unspecified eye: Secondary | ICD-10-CM | POA: Diagnosis not present

## 2012-10-18 DIAGNOSIS — H2510 Age-related nuclear cataract, unspecified eye: Secondary | ICD-10-CM | POA: Diagnosis not present

## 2012-10-18 DIAGNOSIS — H35379 Puckering of macula, unspecified eye: Secondary | ICD-10-CM | POA: Diagnosis not present

## 2013-02-28 ENCOUNTER — Encounter: Payer: Self-pay | Admitting: Gastroenterology

## 2013-03-14 ENCOUNTER — Ambulatory Visit (AMBULATORY_SURGERY_CENTER): Payer: Medicare Other | Admitting: *Deleted

## 2013-03-14 VITALS — Ht 66.0 in | Wt 183.4 lb

## 2013-03-14 DIAGNOSIS — Z6830 Body mass index (BMI) 30.0-30.9, adult: Secondary | ICD-10-CM | POA: Diagnosis not present

## 2013-03-14 DIAGNOSIS — J449 Chronic obstructive pulmonary disease, unspecified: Secondary | ICD-10-CM | POA: Diagnosis not present

## 2013-03-14 DIAGNOSIS — D751 Secondary polycythemia: Secondary | ICD-10-CM | POA: Diagnosis not present

## 2013-03-14 DIAGNOSIS — I10 Essential (primary) hypertension: Secondary | ICD-10-CM | POA: Diagnosis not present

## 2013-03-14 DIAGNOSIS — Z800 Family history of malignant neoplasm of digestive organs: Secondary | ICD-10-CM

## 2013-03-14 DIAGNOSIS — N138 Other obstructive and reflux uropathy: Secondary | ICD-10-CM | POA: Diagnosis not present

## 2013-03-14 DIAGNOSIS — G4733 Obstructive sleep apnea (adult) (pediatric): Secondary | ICD-10-CM | POA: Diagnosis not present

## 2013-03-14 DIAGNOSIS — N401 Enlarged prostate with lower urinary tract symptoms: Secondary | ICD-10-CM | POA: Diagnosis not present

## 2013-03-14 DIAGNOSIS — J309 Allergic rhinitis, unspecified: Secondary | ICD-10-CM | POA: Diagnosis not present

## 2013-03-14 MED ORDER — MOVIPREP 100 G PO SOLR: ORAL | Status: DC

## 2013-03-14 NOTE — Progress Notes (Signed)
No allergies to eggs or soy. No problems with anesthesia.  

## 2013-03-15 ENCOUNTER — Encounter: Payer: Self-pay | Admitting: Gastroenterology

## 2013-03-27 ENCOUNTER — Encounter: Payer: Medicare Other | Admitting: Gastroenterology

## 2013-04-08 ENCOUNTER — Encounter: Payer: Self-pay | Admitting: Gastroenterology

## 2013-04-08 ENCOUNTER — Ambulatory Visit (AMBULATORY_SURGERY_CENTER): Payer: Medicare Other | Admitting: Gastroenterology

## 2013-04-08 VITALS — BP 123/67 | HR 78 | Temp 98.0°F | Resp 22 | Ht 66.0 in | Wt 183.0 lb

## 2013-04-08 DIAGNOSIS — D126 Benign neoplasm of colon, unspecified: Secondary | ICD-10-CM

## 2013-04-08 DIAGNOSIS — K5730 Diverticulosis of large intestine without perforation or abscess without bleeding: Secondary | ICD-10-CM | POA: Diagnosis not present

## 2013-04-08 DIAGNOSIS — Z800 Family history of malignant neoplasm of digestive organs: Secondary | ICD-10-CM

## 2013-04-08 DIAGNOSIS — Z860100 Personal history of colon polyps, unspecified: Secondary | ICD-10-CM

## 2013-04-08 MED ORDER — SODIUM CHLORIDE 0.9 % IV SOLN: 500.0000 mL | INTRAVENOUS | Status: DC

## 2013-04-08 NOTE — Progress Notes (Signed)
Procedure ends, to recovery, report given and VSS. 

## 2013-04-08 NOTE — Progress Notes (Signed)
Called to room to assist during endoscopic procedure.  Patient ID and intended procedure confirmed with present staff. Received instructions for my participation in the procedure from the performing physician.  

## 2013-04-08 NOTE — Progress Notes (Signed)
1212 report given to [facility] R.N.

## 2013-04-08 NOTE — Patient Instructions (Addendum)

## 2013-04-08 NOTE — Progress Notes (Signed)
Patient did not experience any of the following events: a burn prior to discharge; a fall within the facility; wrong site/side/patient/procedure/implant event; or a hospital transfer or hospital admission upon discharge from the facility. (G8907) Patient did not have preoperative order for IV antibiotic SSI prophylaxis. (G8918)  

## 2013-04-08 NOTE — Op Note (Signed)
[facility] [address] [place], [address]   COLONOSCOPY PROCEDURE REPORT  PATIENT: [name], [name]  MR#: [id number] BIRTHDATE: 06-11-1941 , 71  yrs. old GENDER: Male ENDOSCOPIST: Mardella Layman, MD, [facility] REFERRED VF:IEPPIRJJOA Felipa Eth, M.D. PROCEDURE DATE:  04/08/2013 PROCEDURE:   Colonoscopy with snare polypectomy First Screening Colonoscopy - Avg.  risk and is 50 yrs.  old or older - No.  Prior Negative Screening - Now for repeat screening. N/A  History of Adenoma - Now for follow-up colonoscopy & has been > or = to 3 yrs.  Yes hx of adenoma.  Has been 3 or more years since last colonoscopy.  Polyps Removed Today? Yes. ASA CLASS:   Class II INDICATIONS:Patient's personal history of colon polyps. MEDICATIONS: Propofol (Diprivan) 260 mg IV  DESCRIPTION OF PROCEDURE:   After the risks benefits and alternatives of the procedure were thoroughly explained, informed consent was obtained.  A digital rectal exam revealed no abnormalities of the rectum.   The LB [id number]  endoscope was introduced through the anus and advanced to the cecum, which was identified by both the appendix and ileocecal valve. No adverse events experienced.   The quality of the prep was excellent, using MoviPrep  The instrument was then slowly withdrawn as the colon was fully examined.      COLON FINDINGS: Two medium sized polypoid shaped sessile polyps, measuring 1.2 cm in size, were found at the cecum.  Polypectomy was performed using hot snare.  All resections were complete and all polyp tissue was completely retrieved.   A small sessile polyp was found in the sigmoid colon.  A polypectomy was performed with a cold snare.  The resection was complete and the polyp tissue was not retrieved.   There was severe diverticulosis noted in the descending colon and sigmoid colon with associated tortuosity and colonic spasm.  Retroflexed views revealed no abnormalities. The time to  cecum=2 minutes 05 seconds.  Withdrawal time=14 minutes 50 seconds.  The scope was withdrawn and the procedure completed. COMPLICATIONS: There were no complications.  ENDOSCOPIC IMPRESSION: 1.   Two medium sized sessile polyps, measuring 1.2 cm in size, were found at the cecum; Polypectomy was performed using hot snare ...r/o atypia per size of polyps 2.   Small sessile polyp was found in the sigmoid colon; polypectomy was performed with a cold snare 3.   There was severe diverticulosis noted in the descending colon and sigmoid colon  RECOMMENDATIONS: 1.  Await biopsy results 2.  Continue current medications 3.  High fiber diet 4.  Repeat Colonoscopy in 3 years.   eSigned:  Mardella Layman, MD, [facility] 04/08/2013 11:53 AM   cc:   PATIENT NAME:  [name], [name] MR#: [id number]

## 2013-04-09 ENCOUNTER — Telehealth: Payer: Self-pay | Admitting: *Deleted

## 2013-04-09 NOTE — Telephone Encounter (Signed)
No answer, left message to call if questions or concerns. 

## 2013-04-12 ENCOUNTER — Encounter: Payer: Self-pay | Admitting: Gastroenterology

## 2013-05-03 ENCOUNTER — Encounter: Payer: Self-pay | Admitting: Internal Medicine

## 2013-05-03 ENCOUNTER — Ambulatory Visit (INDEPENDENT_AMBULATORY_CARE_PROVIDER_SITE_OTHER): Payer: Medicare Other | Admitting: Internal Medicine

## 2013-05-03 VITALS — BP 134/78 | HR 81 | Ht 64.5 in | Wt 182.2 lb

## 2013-05-03 DIAGNOSIS — J449 Chronic obstructive pulmonary disease, unspecified: Secondary | ICD-10-CM | POA: Diagnosis not present

## 2013-05-03 DIAGNOSIS — G4730 Sleep apnea, unspecified: Secondary | ICD-10-CM

## 2013-05-03 DIAGNOSIS — G4733 Obstructive sleep apnea (adult) (pediatric): Secondary | ICD-10-CM

## 2013-05-03 DIAGNOSIS — J309 Allergic rhinitis, unspecified: Secondary | ICD-10-CM

## 2013-05-03 DIAGNOSIS — F17200 Nicotine dependence, unspecified, uncomplicated: Secondary | ICD-10-CM

## 2013-05-03 DIAGNOSIS — Z23 Encounter for immunization: Secondary | ICD-10-CM

## 2013-05-03 MED ORDER — PHENYLEPHRINE HCL 1 % NA SOLN
3.0000 [drp] | Freq: Once | NASAL | Status: AC
  Administered 2013-05-03: 3 [drp] via NASAL

## 2013-05-03 MED ORDER — METHYLPREDNISOLONE ACETATE 80 MG/ML IJ SUSP
80.0000 mg | Freq: Once | INTRAMUSCULAR | Status: AC
  Administered 2013-05-03: 80 mg via INTRAMUSCULAR

## 2013-05-03 NOTE — Patient Instructions (Addendum)
Try otc saline nasal spray, like AYR or store brand in each nostril as often as needed to help nasal stuffiness  It is time to decide to try harder to get off those cigarettes!  Flu vax  Neb neo nasal  Depo 80  Order- [facility] schedule [facility] unattended home sleep study    Dx OSA  Ok to use the nasal strips whenever you need

## 2013-05-03 NOTE — Progress Notes (Signed)
05/03/13- 83 yoM smoker -Former RA sleep patient/CY patient for allergy; now seen for allergy evaluation at his own request. His wife is my patient. He had previously been diagnosed with obstructive sleep apnea and tried CPAP could not tolerate because of nasal congestion and dental repair at that time. He seeks help especially for his nasal congestion. There is history of nasal fracture without surgical repair. Allergy vaccine was tried for 3 or 4 years without benefit. Occasional mild wheeze. He continues smoking one half pack per day. Nasal sprays caused epistaxis. NPSG 04/30/08- AHI 5.6/ hr, weight 183 lbs. He admits some daytime sleepiness with bedtime midnight, waking twice for bathroom before up at 6:30 AM. Family has a history of heart disease, colon cancer and emphysema, but not allergy or sleep apnea.  Prior to Admission medications   Medication Sig Start Date End Date Taking? Authorizing Provider  budesonide-formoterol (SYMBICORT) 160-4.5 MCG/ACT inhaler Inhale 2 puffs into the lungs 2 (two) times daily.   Yes Historical Provider, MD  simvastatin (ZOCOR) 20 MG tablet Take 20 mg by mouth every evening.   Yes Historical Provider, MD  tamsulosin (FLOMAX) 0.4 MG CAPS Take by mouth daily.   Yes Historical Provider, MD   Past Medical History  Diagnosis Date  . COPD (chronic obstructive pulmonary disease)   . Hyperlipidemia   . Sleep apnea     cannot tolerate cpap   Past Surgical History  Procedure Laterality Date  . Cystoscopy  2012    for bleeding in bladder x 3  . Cyst back  2011   Family History  Problem Relation Age of Onset  . Colon cancer Brother 59  . Emphysema    . Heart disease    . Cancer     History   Social History  . Marital Status: Married    Spouse Name: N/A    Number of Children: N/A  . Years of Education: N/A   Occupational History  . contractor    Social History Main Topics  . Smoking status: Current Every Day Smoker -- 0.50 packs/day    Types:  Cigarettes  . Smokeless tobacco: Never Used  . Alcohol Use: Yes     Comment: occasional  . Drug Use: No  . Sexual Activity: Not on file   Other Topics Concern  . Not on file   Social History Narrative  . No narrative on file   ROS-see HPI Constitutional:   No-   weight loss, night sweats, fevers, chills, fatigue, lassitude. HEENT:   No-  headaches, difficulty swallowing, tooth/dental problems, sore throat,       No-  +sneezing,No- itching, ear ache,+ nasal congestion,no- post nasal drip,  CV:  No-   chest pain, orthopnea, PND, swelling in lower extremities, anasarca,  dizziness, palpitations Resp: +  shortness of breath with exertion or at rest.              No-   productive cough,  No non-productive cough,  No- coughing up of blood.              No-   change in color of mucus.  No- wheezing.   Skin: No-   rash or lesions. GI:  No-   heartburn, indigestion, abdominal pain, nausea, vomiting, diarrhea,                 change in bowel habits, loss of appetite GU: No-   dysuria, change in color of urine, no urgency or frequency.  No-  flank pain. MS:  No-   joint pain or swelling.  No- decreased range of motion.  No- back pain. Neuro-     nothing unusual Psych:  No- change in mood or affect. No depression or anxiety.  No memory loss.  OBJ- Physical Exam General- Alert, Oriented, Affect-appropriate, Distress- none acute Skin- rash-none, lesions- none, excoriation- none Lymphadenopathy- none Head- atraumatic            Eyes- Gross vision intact, PERRLA, conjunctivae and secretions clear            Ears- Hearing, canals-normal            Nose- +slight external deviation, no-Septal dev, mucus, polyps, erosion, perforation             Throat- Mallampati III-IV , mucosa clear , drainage- none, tonsils- atrophic Neck- flexible , trachea midline, no stridor , thyroid nl, carotid no bruit Chest - symmetrical excursion , unlabored           Heart/CV- RRR , no murmur , no gallop  , no rub, nl  s1 s2                           - JVD- none , edema- none, stasis changes- none, varices- none           Lung- +distant but clear to P&A, wheeze- none, cough- none , dullness-none, rub- none           Chest wall-  Abd- tender-no, distended-no, bowel sounds-present, HSM- no Br/ Gen/ Rectal- Not done, not indicated Extrem- cyanosis- none, clubbing, none, atrophy- none, strength- nl Neuro- grossly intact to observation

## 2013-05-06 DIAGNOSIS — G4730 Sleep apnea, unspecified: Secondary | ICD-10-CM | POA: Diagnosis not present

## 2013-05-13 NOTE — Assessment & Plan Note (Signed)
Probable COPD. He thinks he was told he had emphysema. Plan- neb xop, depomedrol, smoking cessation

## 2013-05-13 NOTE — Assessment & Plan Note (Signed)
We will come back to this issue. Plan-short-term trial of Depo-Medrol for effect

## 2013-05-13 NOTE — Assessment & Plan Note (Signed)
I emphasized that tobacco irritation may be the primary problem with his nasal airway. He is aware of a diagnosis of emphysema. We discussed available support measures. He is not yet motivated to make a real effort.

## 2013-05-13 NOTE — Assessment & Plan Note (Signed)
Plan-unattended home sleep study, nasal strips, saline nasal spray

## 2013-05-18 DIAGNOSIS — G4733 Obstructive sleep apnea (adult) (pediatric): Secondary | ICD-10-CM

## 2013-05-20 ENCOUNTER — Encounter: Payer: Self-pay | Admitting: Internal Medicine

## 2013-06-07 ENCOUNTER — Telehealth: Payer: Self-pay | Admitting: Internal Medicine

## 2013-06-07 MED ORDER — PREDNISONE 10 MG PO TABS: ORAL_TABLET | ORAL | Status: DC

## 2013-06-07 NOTE — Telephone Encounter (Signed)
Called and spoke with pt and he stated that he is having a sore throat, lots of nasal drainage and a raw cough that brings up yellow sputum.  Denies any fever.  Pt stated that the prednisone dosepak works great for him.  CY please advise. Thanks  Allergies  Allergen Reactions  . Penicillins     "feels like my skin is crawling"     Last ov--05/03/2013 Next ov--06/21/2013

## 2013-06-07 NOTE — Telephone Encounter (Signed)
Per CY  - Prednisone taper - 40mg  x2 days, 30mg  x2 days, 20mg  x2 days, 10mg  x2 days then stop #20  Pt is aware is aware of CY recs. Rx has been sent in. Nothing further is needed.

## 2013-06-12 ENCOUNTER — Telehealth: Payer: Self-pay | Admitting: Internal Medicine

## 2013-06-12 MED ORDER — CLARITHROMYCIN 500 MG PO TABS: 500.0000 mg | ORAL_TABLET | Freq: Two times a day (BID) | ORAL | Status: DC

## 2013-06-12 NOTE — Telephone Encounter (Signed)
Pt reports he has productive cough w/ yellow phlem, blowing out yellow phlem, PND, nasal congestion x 2 weeks. NO wheezing, no chest tx, no increase SOB. Pt taking prednisone taper from Friday, metamucil and mucinex DM BID. Pt is requesting further recs. Please advise Dr. Maple Hudson thanks Last OV 05/03/13 Pending 06/21/13 Allergies  Allergen Reactions  . Penicillins     "feels like my skin is crawling"

## 2013-06-12 NOTE — Telephone Encounter (Signed)
Offer biaxin 500 mg, # 14, 1 twice daily after meals 

## 2013-06-12 NOTE — Telephone Encounter (Signed)
i spoke with pt and is aware of recs. rx sent

## 2013-06-21 ENCOUNTER — Encounter: Payer: Self-pay | Admitting: Internal Medicine

## 2013-06-21 ENCOUNTER — Ambulatory Visit (INDEPENDENT_AMBULATORY_CARE_PROVIDER_SITE_OTHER): Payer: Medicare Other | Admitting: Internal Medicine

## 2013-06-21 VITALS — BP 100/64 | HR 97 | Ht 64.5 in | Wt 182.2 lb

## 2013-06-21 DIAGNOSIS — G4730 Sleep apnea, unspecified: Secondary | ICD-10-CM | POA: Diagnosis not present

## 2013-06-21 DIAGNOSIS — B370 Candidal stomatitis: Secondary | ICD-10-CM

## 2013-06-21 DIAGNOSIS — J309 Allergic rhinitis, unspecified: Secondary | ICD-10-CM | POA: Diagnosis not present

## 2013-06-21 DIAGNOSIS — F17200 Nicotine dependence, unspecified, uncomplicated: Secondary | ICD-10-CM

## 2013-06-21 DIAGNOSIS — J449 Chronic obstructive pulmonary disease, unspecified: Secondary | ICD-10-CM

## 2013-06-21 DIAGNOSIS — G4733 Obstructive sleep apnea (adult) (pediatric): Secondary | ICD-10-CM

## 2013-06-21 MED ORDER — FLUCONAZOLE 150 MG PO TABS: 150.0000 mg | ORAL_TABLET | Freq: Every day | ORAL | Status: DC

## 2013-06-21 NOTE — Patient Instructions (Addendum)
Please try harder to stop smoking- before it stops you !  Script for Diflucan sent       Stay off the statin until you finish this  Order -  [facility] referral to orthodontist Dr Althea Grimmer consider oral appliance for sleep apnea

## 2013-06-21 NOTE — Progress Notes (Signed)
05/03/13- 30 yoM smoker -Former RA sleep patient/CY patient for allergy; now seen for allergy evaluation at his own request. His wife is my patient. He had previously been diagnosed with obstructive sleep apnea and tried CPAP could not tolerate because of nasal congestion and dental repair at that time. He seeks help especially for his nasal congestion. There is history of nasal fracture without surgical repair. Allergy vaccine was tried for 3 or 4 years without benefit. Occasional mild wheeze. He continues smoking one half pack per day. Nasal sprays caused epistaxis. NPSG 04/30/08- AHI 5.6/ hr, weight 183 lbs. He admits some daytime sleepiness with bedtime midnight, waking twice for bathroom before up at 6:30 AM. Family has a history of heart disease, colon cancer and emphysema, but not allergy or sleep apnea.  06/21/13- 72 yoM smoker -followed for Allergic rhinitis, OSA, tobacco use/ COPD. His wife is my patient. He called about acute URI/bronchitis 10/24 and we sent pred taper/ biaxin FOLLOWS FOR:  Some cough w/ yellow mucus.  Not sleeping more then 4 hours per night.  Discuss sleep study Has had flu shot. Resolving bronchitis after prednisone and Biaxin. Unattended Home Sleep Study - 05/06/13- Mild obstructive sleep apnea- AHI 12.4/ hr, weight 182 lbs Previously failed to tolerate CPAP because of nasal congestion and dental work being done at that time. He is now specifically interested in an oral appliance as an alternative. He continues to smoke and I emphasized the importance of quitting Mouth irritated-discussed thrush.  ROS-see HPI Constitutional:   No-   weight loss, night sweats, fevers, chills, fatigue, lassitude. HEENT:   No-  headaches, difficulty swallowing, tooth/dental problems, sore throat,       No-  +sneezing,No- itching, ear ache,+ nasal congestion,no- post nasal drip,  CV:  No-   chest pain, orthopnea, PND, swelling in lower extremities, anasarca,  dizziness, palpitations Resp: +   shortness of breath with exertion or at rest.              No-   productive cough,  No non-productive cough,  No- coughing up of blood.              No-   change in color of mucus.  No- wheezing.   Skin: No-   rash or lesions. GI:  No-   heartburn, indigestion, abdominal pain, nausea, vomiting,  GU:  MS:  No-   joint pain or swelling.   Neuro-     nothing unusual Psych:  No- change in mood or affect. No depression or anxiety.  No memory loss.  OBJ- Physical Exam General- Alert, Oriented, Affect-appropriate, Distress- none acute Skin- rash-none, lesions- none, excoriation- none Lymphadenopathy- none Head- atraumatic            Eyes- Gross vision intact, PERRLA, conjunctivae and secretions clear            Ears- Hearing, canals-normal            Nose- +slight external deviation, no-Septal dev, mucus, polyps, erosion, perforation             Throat- Mallampati III-IV , mucosa + thrush , drainage- none, tonsils- atrophic Neck- flexible , trachea midline, no stridor , thyroid nl, carotid no bruit Chest - symmetrical excursion , unlabored           Heart/CV- RRR , no murmur , no gallop  , no rub, nl s1 s2                           -  JVD- none , edema- none, stasis changes- none, varices- none           Lung- +distant but clear to P&A, wheeze- none, cough- none , dullness-none, rub- none           Chest wall-  Abd-  Br/ Gen/ Rectal- Not done, not indicated Extrem- cyanosis- none, clubbing, none, atrophy- none, strength- nl Neuro- grossly intact to observation

## 2013-06-30 ENCOUNTER — Encounter: Payer: Self-pay | Admitting: Internal Medicine

## 2013-06-30 DIAGNOSIS — B370 Candidal stomatitis: Secondary | ICD-10-CM | POA: Insufficient documentation

## 2013-06-30 NOTE — Assessment & Plan Note (Signed)
Smoking cessation counseling. He does not seem motivated.

## 2013-06-30 NOTE — Assessment & Plan Note (Signed)
After recent prednisone and antibiotic. Should clear quickly with Diflucan and mouth care

## 2013-06-30 NOTE — Assessment & Plan Note (Signed)
Plan-smoking cessation was emphasized

## 2013-06-30 NOTE — Assessment & Plan Note (Signed)
Plan- smoking cessation, nasal saline 

## 2013-09-18 DIAGNOSIS — Z125 Encounter for screening for malignant neoplasm of prostate: Secondary | ICD-10-CM | POA: Diagnosis not present

## 2013-09-18 DIAGNOSIS — R82998 Other abnormal findings in urine: Secondary | ICD-10-CM | POA: Diagnosis not present

## 2013-09-18 DIAGNOSIS — I10 Essential (primary) hypertension: Secondary | ICD-10-CM | POA: Diagnosis not present

## 2013-09-18 DIAGNOSIS — E785 Hyperlipidemia, unspecified: Secondary | ICD-10-CM | POA: Diagnosis not present

## 2013-09-25 DIAGNOSIS — Z1331 Encounter for screening for depression: Secondary | ICD-10-CM | POA: Diagnosis not present

## 2013-09-25 DIAGNOSIS — N138 Other obstructive and reflux uropathy: Secondary | ICD-10-CM | POA: Diagnosis not present

## 2013-09-25 DIAGNOSIS — Z125 Encounter for screening for malignant neoplasm of prostate: Secondary | ICD-10-CM | POA: Diagnosis not present

## 2013-09-25 DIAGNOSIS — I10 Essential (primary) hypertension: Secondary | ICD-10-CM | POA: Diagnosis not present

## 2013-09-25 DIAGNOSIS — M2550 Pain in unspecified joint: Secondary | ICD-10-CM | POA: Diagnosis not present

## 2013-09-25 DIAGNOSIS — E785 Hyperlipidemia, unspecified: Secondary | ICD-10-CM | POA: Diagnosis not present

## 2013-09-25 DIAGNOSIS — Z0000 Encounter for general adult medical examination without abnormal findings: Secondary | ICD-10-CM | POA: Diagnosis not present

## 2013-09-25 DIAGNOSIS — J449 Chronic obstructive pulmonary disease, unspecified: Secondary | ICD-10-CM | POA: Diagnosis not present

## 2013-09-25 DIAGNOSIS — D751 Secondary polycythemia: Secondary | ICD-10-CM | POA: Diagnosis not present

## 2013-09-25 DIAGNOSIS — I4510 Unspecified right bundle-branch block: Secondary | ICD-10-CM | POA: Diagnosis not present

## 2013-09-25 DIAGNOSIS — N401 Enlarged prostate with lower urinary tract symptoms: Secondary | ICD-10-CM | POA: Diagnosis not present

## 2013-10-24 ENCOUNTER — Ambulatory Visit: Payer: Medicare Other | Admitting: Internal Medicine

## 2013-11-27 DIAGNOSIS — H524 Presbyopia: Secondary | ICD-10-CM | POA: Diagnosis not present

## 2013-11-27 DIAGNOSIS — H40019 Open angle with borderline findings, low risk, unspecified eye: Secondary | ICD-10-CM | POA: Diagnosis not present

## 2013-11-27 DIAGNOSIS — H2510 Age-related nuclear cataract, unspecified eye: Secondary | ICD-10-CM | POA: Diagnosis not present

## 2013-11-27 DIAGNOSIS — H35379 Puckering of macula, unspecified eye: Secondary | ICD-10-CM | POA: Diagnosis not present

## 2013-12-10 DIAGNOSIS — G4733 Obstructive sleep apnea (adult) (pediatric): Secondary | ICD-10-CM | POA: Diagnosis not present

## 2013-12-10 DIAGNOSIS — I10 Essential (primary) hypertension: Secondary | ICD-10-CM | POA: Diagnosis not present

## 2013-12-10 DIAGNOSIS — Z6828 Body mass index (BMI) 28.0-28.9, adult: Secondary | ICD-10-CM | POA: Diagnosis not present

## 2013-12-10 DIAGNOSIS — F329 Major depressive disorder, single episode, unspecified: Secondary | ICD-10-CM | POA: Diagnosis not present

## 2014-02-07 ENCOUNTER — Telehealth: Payer: Self-pay | Admitting: Internal Medicine

## 2014-02-07 MED ORDER — PREDNISONE 10 MG PO TABS: ORAL_TABLET | ORAL | Status: DC

## 2014-02-07 NOTE — Telephone Encounter (Signed)
Spoke with the pt and notified of recs per CDY  He verbalized understanding  Rx was sent

## 2014-02-07 NOTE — Telephone Encounter (Signed)
Ok pred 10 mg, # 20. 4 X 2 DAYS, 3 X 2 DAYS, 2 X 2 DAYS, 1 X 2 DAYS

## 2014-02-07 NOTE — Telephone Encounter (Signed)
Called spoke with pt. He c/o nasal congestion, PND, prod cough w/ white phlem. NO wheezing, no chest tx, no SOB. He wants prednisone called into [facility] [place], [place]. Please advise Dr. Annamaria Boots thanks  Allergies  Allergen Reactions  . Penicillins     "feels like my skin is crawling"     Current Outpatient Prescriptions on File Prior to Visit  Medication Sig Dispense Refill  . budesonide-formoterol (SYMBICORT) 160-4.5 MCG/ACT inhaler Inhale 2 puffs into the lungs 2 (two) times daily.      . clarithromycin (BIAXIN) 500 MG tablet Take 1 tablet (500 mg total) by mouth 2 (two) times daily.  14 tablet  0  . fluconazole (DIFLUCAN) 150 MG tablet Take 1 tablet (150 mg total) by mouth daily.  2 tablet  0  . simvastatin (ZOCOR) 20 MG tablet Take 20 mg by mouth every evening.      . tamsulosin (FLOMAX) 0.4 MG CAPS Take by mouth daily.       No current facility-administered medications on file prior to visit.

## 2014-05-01 DIAGNOSIS — I10 Essential (primary) hypertension: Secondary | ICD-10-CM | POA: Diagnosis not present

## 2014-05-01 DIAGNOSIS — E785 Hyperlipidemia, unspecified: Secondary | ICD-10-CM | POA: Diagnosis not present

## 2014-05-01 DIAGNOSIS — F329 Major depressive disorder, single episode, unspecified: Secondary | ICD-10-CM | POA: Diagnosis not present

## 2014-05-01 DIAGNOSIS — G2581 Restless legs syndrome: Secondary | ICD-10-CM | POA: Diagnosis not present

## 2014-05-01 DIAGNOSIS — F17200 Nicotine dependence, unspecified, uncomplicated: Secondary | ICD-10-CM | POA: Diagnosis not present

## 2014-05-01 DIAGNOSIS — D751 Secondary polycythemia: Secondary | ICD-10-CM | POA: Diagnosis not present

## 2014-05-01 DIAGNOSIS — Z23 Encounter for immunization: Secondary | ICD-10-CM | POA: Diagnosis not present

## 2014-05-01 DIAGNOSIS — Z1331 Encounter for screening for depression: Secondary | ICD-10-CM | POA: Diagnosis not present

## 2014-06-11 DIAGNOSIS — J449 Chronic obstructive pulmonary disease, unspecified: Secondary | ICD-10-CM | POA: Diagnosis not present

## 2014-06-11 DIAGNOSIS — R05 Cough: Secondary | ICD-10-CM | POA: Diagnosis not present

## 2014-06-11 DIAGNOSIS — J45909 Unspecified asthma, uncomplicated: Secondary | ICD-10-CM | POA: Diagnosis not present

## 2014-06-11 DIAGNOSIS — Z6828 Body mass index (BMI) 28.0-28.9, adult: Secondary | ICD-10-CM | POA: Diagnosis not present

## 2014-09-25 DIAGNOSIS — R8299 Other abnormal findings in urine: Secondary | ICD-10-CM | POA: Diagnosis not present

## 2014-09-25 DIAGNOSIS — Z125 Encounter for screening for malignant neoplasm of prostate: Secondary | ICD-10-CM | POA: Diagnosis not present

## 2014-09-25 DIAGNOSIS — E785 Hyperlipidemia, unspecified: Secondary | ICD-10-CM | POA: Diagnosis not present

## 2014-09-25 DIAGNOSIS — I10 Essential (primary) hypertension: Secondary | ICD-10-CM | POA: Diagnosis not present

## 2014-10-02 DIAGNOSIS — Z008 Encounter for other general examination: Secondary | ICD-10-CM | POA: Diagnosis not present

## 2014-10-02 DIAGNOSIS — Z1389 Encounter for screening for other disorder: Secondary | ICD-10-CM | POA: Diagnosis not present

## 2014-10-02 DIAGNOSIS — E0590 Thyrotoxicosis, unspecified without thyrotoxic crisis or storm: Secondary | ICD-10-CM | POA: Diagnosis not present

## 2014-10-02 DIAGNOSIS — G2581 Restless legs syndrome: Secondary | ICD-10-CM | POA: Diagnosis not present

## 2014-10-02 DIAGNOSIS — Z23 Encounter for immunization: Secondary | ICD-10-CM | POA: Diagnosis not present

## 2014-10-02 DIAGNOSIS — I10 Essential (primary) hypertension: Secondary | ICD-10-CM | POA: Diagnosis not present

## 2014-10-02 DIAGNOSIS — J449 Chronic obstructive pulmonary disease, unspecified: Secondary | ICD-10-CM | POA: Diagnosis not present

## 2014-10-02 DIAGNOSIS — K635 Polyp of colon: Secondary | ICD-10-CM | POA: Diagnosis not present

## 2014-10-02 DIAGNOSIS — F329 Major depressive disorder, single episode, unspecified: Secondary | ICD-10-CM | POA: Diagnosis not present

## 2014-10-02 DIAGNOSIS — I4510 Unspecified right bundle-branch block: Secondary | ICD-10-CM | POA: Diagnosis not present

## 2014-10-02 DIAGNOSIS — Z6827 Body mass index (BMI) 27.0-27.9, adult: Secondary | ICD-10-CM | POA: Diagnosis not present

## 2014-10-02 DIAGNOSIS — D751 Secondary polycythemia: Secondary | ICD-10-CM | POA: Diagnosis not present

## 2014-12-19 DIAGNOSIS — J309 Allergic rhinitis, unspecified: Secondary | ICD-10-CM | POA: Diagnosis not present

## 2014-12-19 DIAGNOSIS — J45909 Unspecified asthma, uncomplicated: Secondary | ICD-10-CM | POA: Diagnosis not present

## 2014-12-19 DIAGNOSIS — F17200 Nicotine dependence, unspecified, uncomplicated: Secondary | ICD-10-CM | POA: Diagnosis not present

## 2014-12-19 DIAGNOSIS — H6122 Impacted cerumen, left ear: Secondary | ICD-10-CM | POA: Diagnosis not present

## 2014-12-19 DIAGNOSIS — R05 Cough: Secondary | ICD-10-CM | POA: Diagnosis not present

## 2014-12-19 DIAGNOSIS — Z6827 Body mass index (BMI) 27.0-27.9, adult: Secondary | ICD-10-CM | POA: Diagnosis not present

## 2014-12-19 DIAGNOSIS — H9192 Unspecified hearing loss, left ear: Secondary | ICD-10-CM | POA: Diagnosis not present

## 2015-04-02 DIAGNOSIS — J309 Allergic rhinitis, unspecified: Secondary | ICD-10-CM | POA: Diagnosis not present

## 2015-04-02 DIAGNOSIS — J449 Chronic obstructive pulmonary disease, unspecified: Secondary | ICD-10-CM | POA: Diagnosis not present

## 2015-04-02 DIAGNOSIS — Z6827 Body mass index (BMI) 27.0-27.9, adult: Secondary | ICD-10-CM | POA: Diagnosis not present

## 2015-04-02 DIAGNOSIS — F17200 Nicotine dependence, unspecified, uncomplicated: Secondary | ICD-10-CM | POA: Diagnosis not present

## 2015-04-02 DIAGNOSIS — I10 Essential (primary) hypertension: Secondary | ICD-10-CM | POA: Diagnosis not present

## 2015-05-11 DIAGNOSIS — R05 Cough: Secondary | ICD-10-CM | POA: Diagnosis not present

## 2015-05-11 DIAGNOSIS — Z6828 Body mass index (BMI) 28.0-28.9, adult: Secondary | ICD-10-CM | POA: Diagnosis not present

## 2015-05-11 DIAGNOSIS — J449 Chronic obstructive pulmonary disease, unspecified: Secondary | ICD-10-CM | POA: Diagnosis not present

## 2015-05-11 DIAGNOSIS — Z23 Encounter for immunization: Secondary | ICD-10-CM | POA: Diagnosis not present

## 2015-05-13 DIAGNOSIS — Z23 Encounter for immunization: Secondary | ICD-10-CM | POA: Diagnosis not present

## 2015-06-22 ENCOUNTER — Encounter: Payer: Self-pay | Admitting: Internal Medicine

## 2015-07-13 DIAGNOSIS — R109 Unspecified abdominal pain: Secondary | ICD-10-CM | POA: Diagnosis not present

## 2015-07-13 DIAGNOSIS — I10 Essential (primary) hypertension: Secondary | ICD-10-CM | POA: Diagnosis not present

## 2015-07-13 DIAGNOSIS — R8299 Other abnormal findings in urine: Secondary | ICD-10-CM | POA: Diagnosis not present

## 2015-07-13 DIAGNOSIS — J449 Chronic obstructive pulmonary disease, unspecified: Secondary | ICD-10-CM | POA: Diagnosis not present

## 2015-07-13 DIAGNOSIS — N401 Enlarged prostate with lower urinary tract symptoms: Secondary | ICD-10-CM | POA: Diagnosis not present

## 2015-07-13 DIAGNOSIS — F17200 Nicotine dependence, unspecified, uncomplicated: Secondary | ICD-10-CM | POA: Diagnosis not present

## 2015-07-13 DIAGNOSIS — N390 Urinary tract infection, site not specified: Secondary | ICD-10-CM | POA: Diagnosis not present

## 2015-07-13 DIAGNOSIS — Z6828 Body mass index (BMI) 28.0-28.9, adult: Secondary | ICD-10-CM | POA: Diagnosis not present

## 2015-07-27 ENCOUNTER — Encounter: Payer: Self-pay | Admitting: Internal Medicine

## 2015-11-10 DIAGNOSIS — I10 Essential (primary) hypertension: Secondary | ICD-10-CM | POA: Diagnosis not present

## 2015-11-10 DIAGNOSIS — E784 Other hyperlipidemia: Secondary | ICD-10-CM | POA: Diagnosis not present

## 2015-11-10 DIAGNOSIS — R8290 Unspecified abnormal findings in urine: Secondary | ICD-10-CM | POA: Diagnosis not present

## 2015-11-10 DIAGNOSIS — Z125 Encounter for screening for malignant neoplasm of prostate: Secondary | ICD-10-CM | POA: Diagnosis not present

## 2015-11-10 DIAGNOSIS — Z124 Encounter for screening for malignant neoplasm of cervix: Secondary | ICD-10-CM | POA: Diagnosis not present

## 2015-11-10 DIAGNOSIS — N390 Urinary tract infection, site not specified: Secondary | ICD-10-CM | POA: Diagnosis not present

## 2015-11-17 DIAGNOSIS — E784 Other hyperlipidemia: Secondary | ICD-10-CM | POA: Diagnosis not present

## 2015-11-17 DIAGNOSIS — Z6829 Body mass index (BMI) 29.0-29.9, adult: Secondary | ICD-10-CM | POA: Diagnosis not present

## 2015-11-17 DIAGNOSIS — J309 Allergic rhinitis, unspecified: Secondary | ICD-10-CM | POA: Diagnosis not present

## 2015-11-17 DIAGNOSIS — N401 Enlarged prostate with lower urinary tract symptoms: Secondary | ICD-10-CM | POA: Diagnosis not present

## 2015-11-17 DIAGNOSIS — I10 Essential (primary) hypertension: Secondary | ICD-10-CM | POA: Diagnosis not present

## 2015-11-17 DIAGNOSIS — G4733 Obstructive sleep apnea (adult) (pediatric): Secondary | ICD-10-CM | POA: Diagnosis not present

## 2015-11-17 DIAGNOSIS — Z1389 Encounter for screening for other disorder: Secondary | ICD-10-CM | POA: Diagnosis not present

## 2015-11-17 DIAGNOSIS — E0590 Thyrotoxicosis, unspecified without thyrotoxic crisis or storm: Secondary | ICD-10-CM | POA: Diagnosis not present

## 2015-11-17 DIAGNOSIS — Z0000 Encounter for general adult medical examination without abnormal findings: Secondary | ICD-10-CM | POA: Diagnosis not present

## 2015-11-17 DIAGNOSIS — J449 Chronic obstructive pulmonary disease, unspecified: Secondary | ICD-10-CM | POA: Diagnosis not present

## 2015-11-17 DIAGNOSIS — F329 Major depressive disorder, single episode, unspecified: Secondary | ICD-10-CM | POA: Diagnosis not present

## 2016-02-05 ENCOUNTER — Encounter: Payer: Self-pay | Admitting: Gastroenterology

## 2016-03-28 ENCOUNTER — Ambulatory Visit (AMBULATORY_SURGERY_CENTER): Payer: Self-pay

## 2016-03-28 ENCOUNTER — Encounter (INDEPENDENT_AMBULATORY_CARE_PROVIDER_SITE_OTHER): Payer: Self-pay

## 2016-03-28 VITALS — Ht 66.0 in | Wt 177.8 lb

## 2016-03-28 DIAGNOSIS — Z860100 Personal history of colon polyps, unspecified: Secondary | ICD-10-CM

## 2016-03-28 MED ORDER — SUPREP BOWEL PREP KIT 17.5-3.13-1.6 GM/177ML PO SOLN: 1.0000 | Freq: Once | ORAL | 0 refills | Status: AC

## 2016-03-28 NOTE — Progress Notes (Signed)
No allergies to eggs or soy No past problems with anesthesia No diet meds No home oxygen  Has email and internet; declined emmi

## 2016-04-11 ENCOUNTER — Encounter: Payer: Self-pay | Admitting: Gastroenterology

## 2016-04-11 ENCOUNTER — Ambulatory Visit (AMBULATORY_SURGERY_CENTER): Payer: Medicare Other | Admitting: Gastroenterology

## 2016-04-11 VITALS — BP 112/84 | HR 76 | Temp 97.3°F | Resp 13 | Ht 66.0 in | Wt 177.0 lb

## 2016-04-11 DIAGNOSIS — G4730 Sleep apnea, unspecified: Secondary | ICD-10-CM | POA: Diagnosis not present

## 2016-04-11 DIAGNOSIS — D120 Benign neoplasm of cecum: Secondary | ICD-10-CM | POA: Diagnosis not present

## 2016-04-11 DIAGNOSIS — Z800 Family history of malignant neoplasm of digestive organs: Secondary | ICD-10-CM | POA: Diagnosis not present

## 2016-04-11 DIAGNOSIS — D122 Benign neoplasm of ascending colon: Secondary | ICD-10-CM | POA: Diagnosis not present

## 2016-04-11 DIAGNOSIS — Z860100 Personal history of colon polyps, unspecified: Secondary | ICD-10-CM

## 2016-04-11 DIAGNOSIS — J449 Chronic obstructive pulmonary disease, unspecified: Secondary | ICD-10-CM | POA: Diagnosis not present

## 2016-04-11 MED ORDER — SODIUM CHLORIDE 0.9 % IV SOLN: 500.0000 mL | INTRAVENOUS | Status: DC

## 2016-04-11 NOTE — Patient Instructions (Signed)
Impression/recommendations:  Polyps (handout given) Diverticulosis (handout given) High Fiber Diet (handout given) Hemorrhoids (handout given)  No aspirin, aspirin containing products, ibuprofen, non-steroidal anti-inflammatory agents for 5 days. May resume 04/16/16 if needed.  YOU HAD AN ENDOSCOPIC PROCEDURE TODAY AT [facility]:   Refer to the procedure report that was given to you for any specific questions about what was found during the examination.  If the procedure report does not answer your questions, please call your gastroenterologist to clarify.  If you requested that your care partner not be given the details of your procedure findings, then the procedure report has been included in a sealed envelope for you to review at your convenience later.  YOU SHOULD EXPECT: Some feelings of bloating in the abdomen. Passage of more gas than usual.  Walking can help get rid of the air that was put into your GI tract during the procedure and reduce the bloating. If you had a lower endoscopy (such as a colonoscopy or flexible sigmoidoscopy) you may notice spotting of blood in your stool or on the toilet paper. If you underwent a bowel prep for your procedure, you may not have a normal bowel movement for a few days.  Please Note:  You might notice some irritation and congestion in your nose or some drainage.  This is from the oxygen used during your procedure.  There is no need for concern and it should clear up in a day or so.  SYMPTOMS TO REPORT IMMEDIATELY:   Following lower endoscopy (colonoscopy or flexible sigmoidoscopy):  Excessive amounts of blood in the stool  Significant tenderness or worsening of abdominal pains  Swelling of the abdomen that is new, acute  Fever of 100F or higher   For urgent or emergent issues, a gastroenterologist can be reached at any hour by calling [phone].   DIET:  We do recommend a small meal at first, but then you may proceed to  your regular diet.  Drink plenty of fluids but you should avoid alcoholic beverages for 24 hours.  ACTIVITY:  You should plan to take it easy for the rest of today and you should NOT DRIVE or use heavy machinery until tomorrow (because of the sedation medicines used during the test).    FOLLOW UP: Our staff will call the number listed on your records the next business day following your procedure to check on you and address any questions or concerns that you may have regarding the information given to you following your procedure. If we do not reach you, we will leave a message.  However, if you are feeling well and you are not experiencing any problems, there is no need to return our call.  We will assume that you have returned to your regular daily activities without incident.  If any biopsies were taken you will be contacted by phone or by letter within the next 1-3 weeks.  Please call us at [phone] if you have not heard about the biopsies in 3 weeks.    SIGNATURES/CONFIDENTIALITY: You and/or your care partner have signed paperwork which will be entered into your electronic medical record.  These signatures attest to the fact that that the information above on your After Visit Summary has been reviewed and is understood.  Full responsibility of the confidentiality of this discharge information lies with you and/or your care-partner.

## 2016-04-11 NOTE — Op Note (Signed)
[name]: [name] Procedure Date: 04/11/2016 11:00 AM MRN: [id number] Endoscopist: [place]. [name] , MD Age: 75 Referring MD:  Date of Birth: [birth year] Gender: Male Account #: [id number] Procedure:                Colonoscopy Indications:              Surveillance: Personal history of adenomatous                            polyps on last colonoscopy > 3 years ago, Unclear                            history of colonic neoplasia."brother had colon                            surgery for some condition" Medicines:                Monitored Anesthesia Care Procedure:                Pre-Anesthesia Assessment:                           - Prior to the procedure, a History and Physical                            was performed, and patient medications and                            allergies were reviewed. The patient's tolerance of                            previous anesthesia was also reviewed. The risks                            and benefits of the procedure and the sedation                            options and risks were discussed with the patient.                            All questions were answered, and informed consent                            was obtained. Prior Anticoagulants: The patient has                            taken no previous anticoagulant or antiplatelet                            agents. ASA Grade Assessment: II - A patient with                            mild systemic disease. After reviewing the risks  and benefits, the patient was deemed in                            satisfactory condition to undergo the procedure.                           After obtaining informed consent, the colonoscope                            was passed under direct vision. Throughout the                            procedure, the patient's blood pressure, pulse, and                            oxygen saturations were monitored  continuously. The                            Model PCF-H190DL [id number]) scope was introduced                            through the anus and advanced to the the cecum,                            identified by appendiceal orifice and ileocecal                            valve. The colonoscopy was performed without                            difficulty. The patient tolerated the procedure                            well. The quality of the bowel preparation was                            good. The ileocecal valve, appendiceal orifice, and                            rectum were photographed. The bowel preparation                            used was SUPREP. The quality of the bowel                            preparation was evaluated using the BBPS [facility]                            Bowel Preparation Scale) with scores of: Right                            Colon = 2, Transverse Colon = 2 and Left Colon = 2.  The total BBPS score equals 6. Scope In: 11:11:32 AM Scope Out: 11:27:44 AM Scope Withdrawal Time: 0 hours 12 minutes 15 seconds  Total Procedure Duration: 0 hours 16 minutes 12 seconds  Findings:                 The perianal and digital rectal examinations were                            normal.                           Multiple small-mouthed diverticula were found in                            the left colon and right colon.                           A 6 mm polyp was found in the cecum. The polyp was                            sessile. The polyp was removed with a hot snare.                            Resection and retrieval were complete.                           Internal hemorrhoids were found during                            retroflexion. The hemorrhoids were small and Grade                            I (internal hemorrhoids that do not prolapse).                           The exam was otherwise without abnormality. Complications:            No  immediate complications. Estimated Blood Loss:     Estimated blood loss: none. Impression:               - Diverticulosis in the left colon and in the right                            colon.                           - One 6 mm polyp in the cecum, removed with a hot                            snare. Resected and retrieved.                           - Internal hemorrhoids.                           - The examination was otherwise normal. Recommendation:           -  Patient has a contact number available for                            emergencies. The signs and symptoms of potential                            delayed complications were discussed with the                            patient. Return to normal activities tomorrow.                            Written discharge instructions were provided to the                            patient.                           - Resume previous diet.                           - Continue present medications.                           - No aspirin, ibuprofen, naproxen, or other                            non-steroidal anti-inflammatory drugs for 5 days                            after polyp removal.                           - Await pathology results.                           - Repeat colonoscopy is recommended for                            surveillance. The colonoscopy date will be                            determined after pathology results from today's                            exam become available for review. Karin Griffith L. David Carrow, MD 04/11/2016 11:36:17 AM This report has been signed electronically.

## 2016-04-11 NOTE — Progress Notes (Signed)
Called to room to assist during endoscopic procedure.  Patient ID and intended procedure confirmed with present staff. Received instructions for my participation in the procedure from the performing physician.  

## 2016-04-11 NOTE — Progress Notes (Signed)
A/ox3, pleased with MAC, report to RN 

## 2016-04-12 ENCOUNTER — Telehealth: Payer: Self-pay

## 2016-04-12 NOTE — Telephone Encounter (Signed)
  Follow up Call-  Call back number 04/11/2016  Post procedure Call Back phone  # [phone]  Permission to leave phone message Yes  Some recent data might be hidden     Patient questions:  Do you have a fever, pain , or abdominal swelling? No. Pain Score  0 *  Have you tolerated food without any problems? Yes.    Have you been able to return to your normal activities? Yes.    Do you have any questions about your discharge instructions: Diet   no Medications  no Follow up visit  No.  Do you have questions or concerns about your Care? No.  Actions: * If pain score is 4 or above: No action needed, pain <4.

## 2016-04-14 ENCOUNTER — Encounter: Payer: Self-pay | Admitting: Gastroenterology

## 2016-06-06 DIAGNOSIS — Z23 Encounter for immunization: Secondary | ICD-10-CM | POA: Diagnosis not present

## 2016-07-15 DIAGNOSIS — M7021 Olecranon bursitis, right elbow: Secondary | ICD-10-CM | POA: Diagnosis not present

## 2016-07-15 DIAGNOSIS — Z6828 Body mass index (BMI) 28.0-28.9, adult: Secondary | ICD-10-CM | POA: Diagnosis not present

## 2016-07-15 DIAGNOSIS — R040 Epistaxis: Secondary | ICD-10-CM | POA: Diagnosis not present

## 2016-07-15 DIAGNOSIS — K219 Gastro-esophageal reflux disease without esophagitis: Secondary | ICD-10-CM | POA: Diagnosis not present

## 2016-07-15 DIAGNOSIS — J449 Chronic obstructive pulmonary disease, unspecified: Secondary | ICD-10-CM | POA: Diagnosis not present

## 2016-07-28 DIAGNOSIS — G4733 Obstructive sleep apnea (adult) (pediatric): Secondary | ICD-10-CM | POA: Diagnosis not present

## 2016-07-28 DIAGNOSIS — F17200 Nicotine dependence, unspecified, uncomplicated: Secondary | ICD-10-CM | POA: Diagnosis not present

## 2016-07-28 DIAGNOSIS — F329 Major depressive disorder, single episode, unspecified: Secondary | ICD-10-CM | POA: Diagnosis not present

## 2016-07-28 DIAGNOSIS — M7021 Olecranon bursitis, right elbow: Secondary | ICD-10-CM | POA: Diagnosis not present

## 2016-07-28 DIAGNOSIS — I10 Essential (primary) hypertension: Secondary | ICD-10-CM | POA: Diagnosis not present

## 2016-07-28 DIAGNOSIS — R040 Epistaxis: Secondary | ICD-10-CM | POA: Diagnosis not present

## 2016-07-28 DIAGNOSIS — J449 Chronic obstructive pulmonary disease, unspecified: Secondary | ICD-10-CM | POA: Diagnosis not present

## 2016-07-28 DIAGNOSIS — Z6828 Body mass index (BMI) 28.0-28.9, adult: Secondary | ICD-10-CM | POA: Diagnosis not present

## 2016-07-28 DIAGNOSIS — K219 Gastro-esophageal reflux disease without esophagitis: Secondary | ICD-10-CM | POA: Diagnosis not present

## 2016-10-03 DIAGNOSIS — K219 Gastro-esophageal reflux disease without esophagitis: Secondary | ICD-10-CM | POA: Diagnosis not present

## 2016-10-03 DIAGNOSIS — I10 Essential (primary) hypertension: Secondary | ICD-10-CM | POA: Diagnosis not present

## 2016-10-03 DIAGNOSIS — Z6828 Body mass index (BMI) 28.0-28.9, adult: Secondary | ICD-10-CM | POA: Diagnosis not present

## 2016-10-03 DIAGNOSIS — G4733 Obstructive sleep apnea (adult) (pediatric): Secondary | ICD-10-CM | POA: Diagnosis not present

## 2016-10-03 DIAGNOSIS — J45909 Unspecified asthma, uncomplicated: Secondary | ICD-10-CM | POA: Diagnosis not present

## 2016-10-03 DIAGNOSIS — R05 Cough: Secondary | ICD-10-CM | POA: Diagnosis not present

## 2016-10-03 DIAGNOSIS — F17200 Nicotine dependence, unspecified, uncomplicated: Secondary | ICD-10-CM | POA: Diagnosis not present

## 2016-10-03 DIAGNOSIS — J449 Chronic obstructive pulmonary disease, unspecified: Secondary | ICD-10-CM | POA: Diagnosis not present

## 2016-10-05 DIAGNOSIS — J449 Chronic obstructive pulmonary disease, unspecified: Secondary | ICD-10-CM | POA: Diagnosis not present

## 2016-10-05 DIAGNOSIS — F17200 Nicotine dependence, unspecified, uncomplicated: Secondary | ICD-10-CM | POA: Diagnosis not present

## 2016-10-05 DIAGNOSIS — J189 Pneumonia, unspecified organism: Secondary | ICD-10-CM | POA: Diagnosis not present

## 2016-10-05 DIAGNOSIS — R918 Other nonspecific abnormal finding of lung field: Secondary | ICD-10-CM | POA: Diagnosis not present

## 2016-10-10 DIAGNOSIS — R918 Other nonspecific abnormal finding of lung field: Secondary | ICD-10-CM | POA: Diagnosis not present

## 2016-10-10 DIAGNOSIS — F17200 Nicotine dependence, unspecified, uncomplicated: Secondary | ICD-10-CM | POA: Diagnosis not present

## 2016-10-10 DIAGNOSIS — J189 Pneumonia, unspecified organism: Secondary | ICD-10-CM | POA: Diagnosis not present

## 2016-10-11 DIAGNOSIS — J449 Chronic obstructive pulmonary disease, unspecified: Secondary | ICD-10-CM | POA: Diagnosis not present

## 2016-10-12 ENCOUNTER — Other Ambulatory Visit: Payer: Self-pay | Admitting: Registered Nurse

## 2016-10-12 DIAGNOSIS — F17209 Nicotine dependence, unspecified, with unspecified nicotine-induced disorders: Secondary | ICD-10-CM

## 2016-10-12 DIAGNOSIS — R918 Other nonspecific abnormal finding of lung field: Secondary | ICD-10-CM

## 2016-10-12 DIAGNOSIS — J181 Lobar pneumonia, unspecified organism: Secondary | ICD-10-CM

## 2016-10-12 DIAGNOSIS — J189 Pneumonia, unspecified organism: Secondary | ICD-10-CM

## 2016-10-28 ENCOUNTER — Other Ambulatory Visit: Payer: Medicare Other

## 2016-11-02 ENCOUNTER — Ambulatory Visit
Admission: RE | Admit: 2016-11-02 | Discharge: 2016-11-02 | Disposition: A | Discharge status: Home / Self Care | Payer: Medicare Other | Source: Ambulatory Visit | Attending: Registered Nurse | Admitting: Registered Nurse

## 2016-11-02 DIAGNOSIS — J181 Lobar pneumonia, unspecified organism: Secondary | ICD-10-CM

## 2016-11-02 DIAGNOSIS — R918 Other nonspecific abnormal finding of lung field: Secondary | ICD-10-CM

## 2016-11-02 DIAGNOSIS — F17209 Nicotine dependence, unspecified, with unspecified nicotine-induced disorders: Secondary | ICD-10-CM

## 2016-11-02 DIAGNOSIS — J189 Pneumonia, unspecified organism: Secondary | ICD-10-CM

## 2016-11-03 ENCOUNTER — Other Ambulatory Visit (HOSPITAL_COMMUNITY): Payer: Self-pay | Admitting: Registered Nurse

## 2016-11-03 DIAGNOSIS — R911 Solitary pulmonary nodule: Secondary | ICD-10-CM

## 2016-11-07 ENCOUNTER — Encounter: Payer: Self-pay | Admitting: *Deleted

## 2016-11-07 ENCOUNTER — Telehealth: Payer: Self-pay | Admitting: *Deleted

## 2016-11-07 DIAGNOSIS — R911 Solitary pulmonary nodule: Secondary | ICD-10-CM | POA: Insufficient documentation

## 2016-11-07 NOTE — Telephone Encounter (Signed)
Oncology Nurse Navigator Documentation  Oncology Nurse Navigator Flowsheets 11/07/2016  Navigator Location [facility]  Referral date to RadOnc/MedOnc 11/04/2016  Navigator Encounter Type Telephone/I updated Dr. Julien Nordmann on referral.  He asked me to call and schedule patient to come to next [facility].  I called Mr. Fosnaugh and spoke with him about appt on 11/17/16.  He verbalized understanding of appt time and place.   Telephone Outgoing Call  Abnormal Finding Date 11/02/2016  Treatment Phase Abnormal Scans  Barriers/Navigation Needs Coordination of Care  Interventions Coordination of Care  Coordination of Care Appts  Acuity Level 2  Acuity Level 2 Assistance expediting appointments  Time Spent with Patient 30

## 2016-11-10 ENCOUNTER — Encounter: Payer: Self-pay | Admitting: *Deleted

## 2016-11-10 NOTE — Progress Notes (Signed)
Oncology Nurse Navigator Documentation  Oncology Nurse Navigator Flowsheets 11/10/2016  Navigator Location [facility]  Navigator Encounter Type Letter/Fax/Email/MTOC letter sent  Treatment Phase Abnormal Scans  Barriers/Navigation Needs Education  Education Other  Acuity Level 1  Time Spent with Patient 30

## 2016-11-17 ENCOUNTER — Ambulatory Visit (HOSPITAL_BASED_OUTPATIENT_CLINIC_OR_DEPARTMENT_OTHER): Payer: Medicare Other | Admitting: Internal Medicine

## 2016-11-17 ENCOUNTER — Encounter: Payer: Self-pay | Admitting: *Deleted

## 2016-11-17 ENCOUNTER — Encounter: Payer: Self-pay | Admitting: Internal Medicine

## 2016-11-17 ENCOUNTER — Other Ambulatory Visit (HOSPITAL_BASED_OUTPATIENT_CLINIC_OR_DEPARTMENT_OTHER): Payer: Medicare Other

## 2016-11-17 ENCOUNTER — Other Ambulatory Visit: Payer: Self-pay | Admitting: Medical Oncology

## 2016-11-17 ENCOUNTER — Other Ambulatory Visit (HOSPITAL_COMMUNITY)
Admission: AD | Admit: 2016-11-17 | Discharge: 2016-11-17 | Disposition: A | Discharge status: Home / Self Care | Payer: Medicare Other | Source: Ambulatory Visit | Attending: Internal Medicine | Admitting: Internal Medicine

## 2016-11-17 VITALS — BP 151/71 | HR 83 | Temp 97.5°F | Resp 18 | Ht 66.0 in | Wt 176.1 lb

## 2016-11-17 DIAGNOSIS — R918 Other nonspecific abnormal finding of lung field: Secondary | ICD-10-CM

## 2016-11-17 DIAGNOSIS — F17200 Nicotine dependence, unspecified, uncomplicated: Secondary | ICD-10-CM

## 2016-11-17 DIAGNOSIS — J449 Chronic obstructive pulmonary disease, unspecified: Secondary | ICD-10-CM | POA: Diagnosis not present

## 2016-11-17 DIAGNOSIS — R911 Solitary pulmonary nodule: Secondary | ICD-10-CM | POA: Diagnosis not present

## 2016-11-17 LAB — CBC WITH DIFFERENTIAL/PLATELET
BASO%: 0.5 % (ref 0.0–2.0)
Basophils Absolute: 0 10*3/uL (ref 0.0–0.1)
EOS ABS: 0.1 10*3/uL (ref 0.0–0.5)
EOS%: 1.5 % (ref 0.0–7.0)
HCT: 46.5 % (ref 38.4–49.9)
HGB: 15.8 g/dL (ref 13.0–17.1)
LYMPH%: 8.9 % — ABNORMAL LOW (ref 14.0–49.0)
MCH: 31.9 pg (ref 27.2–33.4)
MCHC: 34 g/dL (ref 32.0–36.0)
MCV: 93.8 fL (ref 79.3–98.0)
MONO#: 1.6 10*3/uL — AB (ref 0.1–0.9)
MONO%: 16.2 % — ABNORMAL HIGH (ref 0.0–14.0)
NEUT%: 72.9 % (ref 39.0–75.0)
NEUTROS ABS: 7.4 10*3/uL — AB (ref 1.5–6.5)
Platelets: 267 10*3/uL (ref 140–400)
RBC: 4.95 10*6/uL (ref 4.20–5.82)
RDW: 13.9 % (ref 11.0–14.6)
WBC: 10.1 10*3/uL (ref 4.0–10.3)
lymph#: 0.9 10*3/uL (ref 0.9–3.3)

## 2016-11-17 LAB — COMPREHENSIVE METABOLIC PANEL
ALT: 12 U/L — AB (ref 17–63)
AST: 13 U/L — ABNORMAL LOW (ref 15–41)
Albumin: 3.8 g/dL (ref 3.5–5.0)
Alkaline Phosphatase: 78 U/L (ref 38–126)
Anion gap: 5 (ref 5–15)
BUN: 16 mg/dL (ref 6–20)
CO2: 35 mmol/L — ABNORMAL HIGH (ref 22–32)
CREATININE: 0.75 mg/dL (ref 0.61–1.24)
Calcium: 9.6 mg/dL (ref 8.9–10.3)
Chloride: 101 mmol/L (ref 101–111)
GFR calc non Af Amer: >60 mL/min (ref >60)
Glucose, Bld: 99 mg/dL (ref 65–99)
Potassium: 4 mmol/L (ref 3.5–5.1)
Sodium: 141 mmol/L (ref 135–145)
Total Bilirubin: 0.5 mg/dL (ref 0.3–1.2)
Total Protein: 7.4 g/dL (ref 6.5–8.1)

## 2016-11-17 NOTE — Progress Notes (Signed)
Oncology Nurse Navigator Documentation  Oncology Nurse Navigator Flowsheets 11/17/2016  Navigator Location [facility]  Navigator Encounter Type Clinic/MDC/spoke with patient and wife today at thoracic clinic.  Patient has an abnormal CT Chest scan and needs further work up.  I explained that and next steps to them.    Abnormal Finding Date 11/02/2016  Multidisiplinary Clinic Date 11/17/2016  Patient Visit Type MedOnc  Treatment Phase Abnormal Scans  Barriers/Navigation Needs Education  Education Other  Interventions Education  Education Method Verbal;Written  Acuity Level 2  Time Spent with Patient 30

## 2016-11-17 NOTE — Progress Notes (Signed)
[place] Clinical Social Work  Clinical Social Work met with patient/family at [employer] appointment to offer support and assess for psychosocial needs.  Patient was accompanied by his spouse, [name], and granddaughter.  Mr. Matheson reported he needed additional testing and he was hoping plan is for surgery.  CSW and patient/family spent majority of visit talking about strategies for quitting smoking.  The patient indicated a 9 on the distress thermometer- stated he was anxious regarding cancer diagnosis but no other concerns at this time. Patient's spouse completed distress screen without patient feedback.  ONCBCN DISTRESS SCREENING 11/17/2016  Screening Type Initial Screening  Distress experienced in past week (1-10) 9  Family Problem type Partner;Children  Emotional problem type Depression;Nervousness/Anxiety;Adjusting to illness;Isolation/feeling alone;Feeling hopeless  Information Concerns Type Lack of info about diagnosis;Lack of info about treatment;Lack of info about complementary therapy choices;Lack of info about maintaining fitness    Clinical Social Work briefly discussed Clinical Social Work role and [employer] support programs/services.  Clinical Social Work encouraged patient to call with any additional questions or concerns.   Maryjean Morn, MSW, LCSW, OSW-C Clinical Social Worker [facility] [phone]

## 2016-11-17 NOTE — Progress Notes (Signed)
[place] Telephone:([phone]   Fax:([phone] Multidisciplinary thoracic oncology clinic  CONSULT NOTE  REFERRING PHYSICIAN: Dr. Berneta Sages  REASON FOR CONSULTATION:  76 years old white male with questionable lung cancer.  HPI [name] is a 76 y.o. male with past medical history significant for hypertension and COPD as well as long history of smoking. The patient mentions that he was complaining of his seasonal allergy and he also had some exposure to dust from cleaning his vacuum. He saw his primary care physician and was started on treatment with prednisone and antibiotics with no improvement in his condition. Chest x-ray at that time was concerning for questionable pneumonia. The patient had CT scan of the chest without contrast performed on 11/02/2016 and it showed a spiculated soft tissue mass measuring 3.4 x 3.4 cm with some extension towards the pleura. There was also partially calcified 2.2 cm nodule in the right lobe of the thyroid but no hilar or mediastinal adenopathy identified. Dr. Dagmar Hait kindly referred the patient to me today for further evaluation and recommendation regarding this new findings. When seen today the patient is feeling fine with no specific complaints except for dry cough as well as shortness breath with exertion but no significant chest pain or hemoptysis. He denied having any significant weight loss or night sweats. He has no nausea, vomiting, diarrhea or constipation. He denied having any headache or visual changes. Family history significant for mother and father with heart disease and Brother died from complication of pneumonia. The patient is married and has 1 daughter. He was accompanied today by his wife [name] and his granddaughter [name]. He is currently retired and used to work in [employer]. He has a history of smoking 1 pack per day for around 61 years and unfortunately continues to smoke 0.5 pack per day. He drinks  alcohol occasionally and no history of drug abuse.  HPI  Past Medical History:  Diagnosis Date  . COPD (chronic obstructive pulmonary disease) ([place])   . Hyperlipidemia   . Sleep apnea    cannot tolerate cpap    Past Surgical History:  Procedure Laterality Date  . COLONOSCOPY W/ POLYPECTOMY    . cyst back  2011  . CYSTOSCOPY  2012   for bleeding in bladder x 3  . MOUTH SURGERY      Family History  Problem Relation Age of Onset  . Colon cancer Brother   . Emphysema    . Heart disease    . Cancer    . Heart disease Father   . Heart disease Maternal Uncle   . Heart disease Paternal Uncle     Social History Social History  Substance Use Topics  . Smoking status: Current Every Day Smoker    Packs/day: 0.50    Types: Cigarettes  . Smokeless tobacco: Never Used  . Alcohol use No    Allergies  Allergen Reactions  . Penicillins     "feels like my skin is crawling"    Current Outpatient Prescriptions  Medication Sig Dispense Refill  . albuterol (PROVENTIL HFA;VENTOLIN HFA) 108 (90 Base) MCG/ACT inhaler Inhale into the lungs every 6 (six) hours as needed for wheezing or shortness of breath.    Marland Kitchen BREO ELLIPTA 200-25 MCG/INH AEPB Inhale 1 Inhaler into the lungs daily.  2  . montelukast (SINGULAIR) 10 MG tablet Take 10 mg by mouth at bedtime.    . sertraline (ZOLOFT) 50 MG tablet Take 1 tablet by mouth  at bedtime.  5  . simvastatin (ZOCOR) 20 MG tablet Take 20 mg by mouth every evening.    . traZODone (DESYREL) 50 MG tablet Take 50 mg by mouth at bedtime.  2  . budesonide-formoterol (SYMBICORT) 160-4.5 MCG/ACT inhaler Inhale 2 puffs into the lungs 2 (two) times daily.     Current Facility-Administered Medications  Medication Dose Route Frequency Provider Last Rate Last Dose  . 0.9 %  sodium chloride infusion  500 mL Intravenous Continuous Nelida Meuse III, MD        Review of Systems  Constitutional: negative Eyes: negative Ears, nose, mouth, throat, and face:  negative Respiratory: positive for cough and dyspnea on exertion Cardiovascular: negative Gastrointestinal: negative Genitourinary:negative Integument/breast: negative Hematologic/lymphatic: negative Musculoskeletal:negative Neurological: negative Behavioral/Psych: negative Endocrine: negative Allergic/Immunologic: negative  Physical Exam  JKD:TOIZT, healthy, no distress, well nourished and well developed SKIN: skin color, texture, turgor are normal, no rashes or significant lesions HEAD: Normocephalic, No masses, lesions, tenderness or abnormalities EYES: normal, PERRLA, Conjunctiva are pink and non-injected EARS: External ears normal, Canals clear OROPHARYNX:no exudate, no erythema and lips, buccal mucosa, and tongue normal  NECK: supple, no adenopathy, no JVD LYMPH:  no palpable lymphadenopathy, no hepatosplenomegaly LUNGS: clear to auscultation , and palpation HEART: regular rate & rhythm, no murmurs and no gallops ABDOMEN:abdomen soft, non-tender, normal bowel sounds and no masses or organomegaly BACK: Back symmetric, no curvature., No CVA tenderness EXTREMITIES:no joint deformities, effusion, or inflammation, no edema, no skin discoloration  NEURO: alert & oriented x 3 with fluent speech, no focal motor/sensory deficits  PERFORMANCE STATUS: ECOG 1  LABORATORY DATA: Lab Results  Component Value Date   WBC 10.1 11/17/2016   HGB 15.8 11/17/2016   HCT 46.5 11/17/2016   MCV 93.8 11/17/2016   PLT 267 11/17/2016      Chemistry   No results found for: NA, K, CL, CO2, BUN, CREATININE, GLU No results found for: CALCIUM, ALKPHOS, AST, ALT, BILITOT     RADIOGRAPHIC STUDIES: Ct Chest Wo Contrast  Result Date: 11/02/2016 CLINICAL DATA:  Recent pneumonia with follow-up exam. EXAM: CT CHEST WITHOUT CONTRAST TECHNIQUE: Multidetector CT imaging of the chest was performed following the standard protocol without IV contrast. COMPARISON:  05/05/2011 FINDINGS: Cardiovascular:  Somewhat limited without IV contrast. Aortic calcifications are seen without aneurysmal dilatation. Mild coronary calcifications are noted. No cardiac enlargement is seen. Mediastinum/Nodes: Thoracic inlet demonstrates enlargement of the right lobe of the thyroid with a partially calcified 2.2 cm nodule identified. No hilar or mediastinal adenopathy is identified. The esophagus as visualized is within normal limits. Lungs/Pleura: The lungs are well aerated bilaterally and demonstrate some mild emphysematous changes. In the left upper lobe along the posterior aspect of the major fissure there is a spiculated soft tissue mass lesion consistent with primary pulmonary neoplasm. This measures 3.4 x 3.4 cm secondary to some extension towards the pleura. No other focal abnormality is noted. Upper Abdomen: No acute abnormality noted. Musculoskeletal: No acute bony abnormality is seen. Degenerative changes of the thoracic spine are noted. IMPRESSION: Spiculated soft tissue mass lesion in the left upper lobe as described. PET-CT and tissue sampling are recommended. These results will be called to the ordering clinician or representative by the Radiologist Assistant, and communication documented in the PACS or zVision Dashboard. Electronically Signed   By: Inez Catalina M.D.   On: 11/02/2016 15:27    ASSESSMENT: This is a very pleasant 76 years old white male with highly suspicious for stage I lung  cancer probably non-small cell carcinoma presenting with left upper lobe lung mass, pending further staging workup and tissue diagnosis.   PLAN: I had a lengthy discussion with the patient and his family about his current condition and further investigation to confirm his diagnosis. I personally and independently reviewed the scan images with the patient and his family. I recommended for the patient to complete the staging workup by ordering a PET scan in addition to MRI of the brain to rule out any metastatic disease. I  will also arrange for the patient to have pulmonary function test. Once the staging workup is complete, I will arrange for the patient to see cardiothoracic surgery for evaluation and consideration of surgical resection if he has no evidence of metastatic disease. The patient will come back for follow-up visit in 2 weeks for follow-up visit at the multidisciplinary thoracic oncology clinic. For his smoking cessation, I strongly encouraged the patient to quit smoking and discussed with him smoking cessation plan. For COPD, he will continue his current treatment with Symbicort, Singulair, albuterol inhaler as well as Breo. He was advised to call immediately if he has any concerning symptoms in the interval. The patient voices understanding of current disease status and treatment options and is in agreement with the current care plan.  All questions were answered. The patient knows to call the clinic with any problems, questions or concerns. We can certainly see the patient much sooner if necessary.  Thank you so much for allowing me to participate in the care of [place]. I will continue to follow up the patient with you and assist in his care.  I spent 40 minutes counseling the patient face to face. The total time spent in the appointment was 60 minutes.  Disclaimer: This note was dictated with voice recognition software. Similar sounding words can inadvertently be transcribed and may not be corrected upon review.   Irvan Tiedt K. November 17, 2016, 3:43 PM

## 2016-11-18 ENCOUNTER — Telehealth: Payer: Self-pay | Admitting: Internal Medicine

## 2016-11-18 ENCOUNTER — Encounter: Payer: Self-pay | Admitting: *Deleted

## 2016-11-18 NOTE — Progress Notes (Signed)
Oncology Nurse Navigator Documentation  Oncology Nurse Navigator Flowsheets 11/18/2016  Navigator Location [facility]  Navigator Encounter Type Other/Patient is scheduled for MRI Brain and PET scan.  I called resp care to get PFT's scheduled.  I left vm message for them to call with my name and phone number.   Treatment Phase Abnormal Scans  Barriers/Navigation Needs Coordination of Care  Interventions Coordination of Care  Coordination of Care Other;Appts  Acuity Level 2  Time Spent with Patient 30

## 2016-11-18 NOTE — Telephone Encounter (Signed)
Per 4/5 los. - Sent inbox message to [facility] for [place] follow up in two weeks. Called Pulmonary lab but they were closed , will call and f/u Monday morning 4/9.

## 2016-11-21 ENCOUNTER — Telehealth: Payer: Self-pay | Admitting: *Deleted

## 2016-11-21 ENCOUNTER — Telehealth: Payer: Self-pay | Admitting: Medical Oncology

## 2016-11-21 ENCOUNTER — Telehealth: Payer: Self-pay | Admitting: Internal Medicine

## 2016-11-21 DIAGNOSIS — R911 Solitary pulmonary nodule: Secondary | ICD-10-CM

## 2016-11-21 NOTE — Telephone Encounter (Signed)
confirmed appts

## 2016-11-21 NOTE — Telephone Encounter (Signed)
Appt scheduled for PFT on 4/9

## 2016-11-21 NOTE — Telephone Encounter (Signed)
Oncology Nurse Navigator Documentation  Oncology Nurse Navigator Flowsheets 11/21/2016  Navigator Location [facility]  Navigator Encounter Type Telephone/I called to update Mr. Bartel on his appt for [place] on 09/02/16.  He verbalized understanding of appt time and place.   Telephone Outgoing Call  Treatment Phase Abnormal Scans  Barriers/Navigation Needs Coordination of Care  Interventions Coordination of Care  Coordination of Care Appts  Acuity Level 1  Time Spent with Patient 15

## 2016-11-21 NOTE — Telephone Encounter (Signed)
Oncology Nurse Navigator Documentation  Oncology Nurse Navigator Flowsheets 11/21/2016  Navigator Location [facility]  Navigator Encounter Type Telephone/I called resp care at [facility] to schedule PFT's.  I was given a date, time, and pre-procedure instructions. I called patient and spoke with his wife.  I updated her on the above.  She verbalized understanding of appt and pre-procedure instructions.   Telephone Outgoing Call  Treatment Phase Abnormal Scans  Barriers/Navigation Needs Coordination of Care;Education  Education Other  Interventions Coordination of Care  Coordination of Care Appts  Education Method Verbal  Acuity Level 2  Time Spent with Patient 27

## 2016-11-22 ENCOUNTER — Ambulatory Visit (HOSPITAL_COMMUNITY)
Admission: RE | Admit: 2016-11-22 | Discharge: 2016-11-22 | Disposition: A | Discharge status: Home / Self Care | Payer: Medicare Other | Source: Ambulatory Visit | Attending: Internal Medicine | Admitting: Internal Medicine

## 2016-11-22 DIAGNOSIS — G9389 Other specified disorders of brain: Secondary | ICD-10-CM | POA: Diagnosis not present

## 2016-11-22 DIAGNOSIS — R911 Solitary pulmonary nodule: Secondary | ICD-10-CM

## 2016-11-22 DIAGNOSIS — I6782 Cerebral ischemia: Secondary | ICD-10-CM | POA: Diagnosis not present

## 2016-11-22 MED ORDER — GADOBENATE DIMEGLUMINE 529 MG/ML IV SOLN
20.0000 mL | Freq: Once | INTRAVENOUS | Status: AC | PRN
  Administered 2016-11-22: 16 mL via INTRAVENOUS

## 2016-11-24 ENCOUNTER — Encounter (HOSPITAL_COMMUNITY)
Admission: RE | Admit: 2016-11-24 | Discharge: 2016-11-24 | Disposition: A | Discharge status: Home / Self Care | Payer: Medicare Other | Source: Ambulatory Visit | Attending: Internal Medicine | Admitting: Internal Medicine

## 2016-11-24 ENCOUNTER — Ambulatory Visit (HOSPITAL_COMMUNITY)
Admission: RE | Admit: 2016-11-24 | Discharge: 2016-11-24 | Disposition: A | Discharge status: Home / Self Care | Payer: Medicare Other | Source: Ambulatory Visit | Attending: Internal Medicine | Admitting: Internal Medicine

## 2016-11-24 DIAGNOSIS — J984 Other disorders of lung: Secondary | ICD-10-CM | POA: Insufficient documentation

## 2016-11-24 DIAGNOSIS — R911 Solitary pulmonary nodule: Secondary | ICD-10-CM

## 2016-11-24 DIAGNOSIS — J449 Chronic obstructive pulmonary disease, unspecified: Secondary | ICD-10-CM | POA: Diagnosis not present

## 2016-11-24 DIAGNOSIS — R918 Other nonspecific abnormal finding of lung field: Secondary | ICD-10-CM | POA: Diagnosis not present

## 2016-11-24 LAB — PULMONARY FUNCTION TEST
DL/VA % PRED: 68 %
DL/VA: 2.96 ml/min/mmHg/L
DLCO UNC % PRED: 41 %
DLCO UNC: 11.22 ml/min/mmHg
FEF 25-75 POST: 0.44 L/s
FEF 25-75 PRE: 0.31 L/s
FEF2575-%Change-Post: 43 %
FEF2575-%PRED-POST: 25 %
FEF2575-%PRED-PRE: 17 %
FEV1-%Change-Post: 13 %
FEV1-%Pred-Post: 41 %
FEV1-%Pred-Pre: 36 %
FEV1-POST: 1.01 L
FEV1-Pre: 0.89 L
FEV1FVC-%CHANGE-POST: 3 %
FEV1FVC-%PRED-PRE: 69 %
FEV6-%CHANGE-POST: 9 %
FEV6-%PRED-POST: 54 %
FEV6-%Pred-Pre: 49 %
FEV6-Post: 1.75 L
FEV6-Pre: 1.6 L
FEV6FVC-%CHANGE-POST: 2 %
FEV6FVC-%Pred-Post: 100 %
FEV6FVC-%Pred-Pre: 97 %
FVC-%Change-Post: 9 %
FVC-%PRED-POST: 55 %
FVC-%Pred-Pre: 50 %
FVC-Post: 1.94 L
FVC-Pre: 1.77 L
POST FEV1/FVC RATIO: 52 %
PRE FEV1/FVC RATIO: 50 %
Post FEV6/FVC ratio: 93 %
Pre FEV6/FVC Ratio: 90 %
RV % PRED: 138 %
RV: 3.32 L
TLC % pred: 88 %
TLC: 5.54 L

## 2016-11-24 LAB — GLUCOSE, CAPILLARY: Glucose-Capillary: 98 mg/dL (ref 65–99)

## 2016-11-24 MED ORDER — ALBUTEROL SULFATE (2.5 MG/3ML) 0.083% IN NEBU
2.5000 mg | INHALATION_SOLUTION | Freq: Once | RESPIRATORY_TRACT | Status: AC
  Administered 2016-11-24: 2.5 mg via RESPIRATORY_TRACT

## 2016-11-24 MED ORDER — FLUDEOXYGLUCOSE F - 18 (FDG) INJECTION: 8.9000 | Freq: Once | INTRAVENOUS | Status: DC | PRN

## 2016-12-01 ENCOUNTER — Ambulatory Visit (HOSPITAL_BASED_OUTPATIENT_CLINIC_OR_DEPARTMENT_OTHER): Payer: Medicare Other | Admitting: Internal Medicine

## 2016-12-01 ENCOUNTER — Encounter: Payer: Self-pay | Admitting: Internal Medicine

## 2016-12-01 ENCOUNTER — Telehealth: Payer: Self-pay | Admitting: Internal Medicine

## 2016-12-01 ENCOUNTER — Other Ambulatory Visit (HOSPITAL_BASED_OUTPATIENT_CLINIC_OR_DEPARTMENT_OTHER): Payer: Medicare Other

## 2016-12-01 ENCOUNTER — Institutional Professional Consult (permissible substitution) (INDEPENDENT_AMBULATORY_CARE_PROVIDER_SITE_OTHER): Payer: Medicare Other | Admitting: Thoracic Surgery (Cardiothoracic Vascular Surgery)

## 2016-12-01 VITALS — BP 135/68 | HR 78 | Temp 98.1°F | Resp 18 | Ht 66.0 in | Wt 177.0 lb

## 2016-12-01 DIAGNOSIS — R911 Solitary pulmonary nodule: Secondary | ICD-10-CM

## 2016-12-01 DIAGNOSIS — J430 Unilateral pulmonary emphysema [MacLeod's syndrome]: Secondary | ICD-10-CM

## 2016-12-01 DIAGNOSIS — J449 Chronic obstructive pulmonary disease, unspecified: Secondary | ICD-10-CM | POA: Diagnosis not present

## 2016-12-01 DIAGNOSIS — R918 Other nonspecific abnormal finding of lung field: Secondary | ICD-10-CM

## 2016-12-01 DIAGNOSIS — F17200 Nicotine dependence, unspecified, uncomplicated: Secondary | ICD-10-CM

## 2016-12-01 LAB — COMPREHENSIVE METABOLIC PANEL
ALT: 15 U/L (ref 0–55)
AST: 12 U/L (ref 5–34)
Albumin: 3.5 g/dL (ref 3.5–5.0)
Alkaline Phosphatase: 95 U/L (ref 40–150)
Anion Gap: 8 mEq/L (ref 3–11)
BUN: 18.3 mg/dL (ref 7.0–26.0)
CALCIUM: 9.9 mg/dL (ref 8.4–10.4)
CHLORIDE: 102 meq/L (ref 98–109)
CO2: 32 mEq/L — ABNORMAL HIGH (ref 22–29)
CREATININE: 0.8 mg/dL (ref 0.7–1.3)
EGFR: 85 mL/min/{1.73_m2} — ABNORMAL LOW (ref >90)
Glucose: 97 mg/dl (ref 70–140)
Potassium: 4.6 mEq/L (ref 3.5–5.1)
Sodium: 142 mEq/L (ref 136–145)
TOTAL PROTEIN: 6.8 g/dL (ref 6.4–8.3)
Total Bilirubin: 0.4 mg/dL (ref 0.20–1.20)

## 2016-12-01 LAB — CBC WITH DIFFERENTIAL/PLATELET
BASO%: 0.8 % (ref 0.0–2.0)
Basophils Absolute: 0.1 10*3/uL (ref 0.0–0.1)
EOS%: 2.2 % (ref 0.0–7.0)
Eosinophils Absolute: 0.2 10*3/uL (ref 0.0–0.5)
HEMATOCRIT: 46.6 % (ref 38.4–49.9)
HGB: 15.8 g/dL (ref 13.0–17.1)
LYMPH#: 0.9 10*3/uL (ref 0.9–3.3)
LYMPH%: 9.1 % — ABNORMAL LOW (ref 14.0–49.0)
MCH: 31.6 pg (ref 27.2–33.4)
MCHC: 33.8 g/dL (ref 32.0–36.0)
MCV: 93.5 fL (ref 79.3–98.0)
MONO#: 1.8 10*3/uL — ABNORMAL HIGH (ref 0.1–0.9)
MONO%: 17.9 % — ABNORMAL HIGH (ref 0.0–14.0)
NEUT%: 70 % (ref 39.0–75.0)
NEUTROS ABS: 7.1 10*3/uL — AB (ref 1.5–6.5)
Platelets: 269 10*3/uL (ref 140–400)
RBC: 4.99 10*6/uL (ref 4.20–5.82)
RDW: 13.9 % (ref 11.0–14.6)
WBC: 10.2 10*3/uL (ref 4.0–10.3)

## 2016-12-01 NOTE — Progress Notes (Signed)
[place] [phone]   [phone] Multidisciplinary thoracic oncology clinic  OFFICE PROGRESS NOTE  Tivis Ringer, MD [address] [place] [address]  DIAGNOSIS: Questionable Stage IB (T2a, N0, M0) lung cancer presented with left upper lobe pulmonary mass, pending tissue diagnosis.  PRIOR THERAPY: None  CURRENT THERAPY: None  INTERVAL HISTORY: [name] 76 y.o. male returns to the clinic today for follow-up visit accompanied by his wife. The patient was found on previous CT scan of the chest to have suspicious mass in the left upper lobe. I saw him 2 weeks ago in order a PET scan as well as MRI of the brain that were performed recently and he is here for evaluation and discussion of his scan results and treatment options. He is feeling fine today with no specific complaints. He denied having any chest pain, shortness of breath, cough or hemoptysis. He has no nausea, vomiting, diarrhea or constipation. He denied having any fever or chills. He denied having any significant headache or visual changes. He also had pulmonary function tests performed recently.  MEDICAL HISTORY: Past Medical History:  Diagnosis Date  . COPD (chronic obstructive pulmonary disease) ([place])   . Hyperlipidemia   . Sleep apnea    cannot tolerate cpap    ALLERGIES:  is allergic to gadolinium derivatives and penicillins.  MEDICATIONS:  Current Outpatient Prescriptions  Medication Sig Dispense Refill  . albuterol (PROVENTIL HFA;VENTOLIN HFA) 108 (90 Base) MCG/ACT inhaler Inhale into the lungs every 6 (six) hours as needed for wheezing or shortness of breath.    Marland Kitchen BREO ELLIPTA 200-25 MCG/INH AEPB Inhale 1 Inhaler into the lungs daily.  2  . budesonide-formoterol (SYMBICORT) 160-4.5 MCG/ACT inhaler Inhale 2 puffs into the lungs 2 (two) times daily.    . montelukast (SINGULAIR) 10 MG tablet Take 10 mg by mouth at bedtime.    . sertraline (ZOLOFT) 50 MG tablet  Take 1 tablet by mouth at bedtime.  5  . simvastatin (ZOCOR) 20 MG tablet Take 20 mg by mouth every evening.    . traZODone (DESYREL) 50 MG tablet Take 50 mg by mouth at bedtime.  2   Current Facility-Administered Medications  Medication Dose Route Frequency Provider Last Rate Last Dose  . 0.9 %  sodium chloride infusion  500 mL Intravenous Continuous Doran Stabler, MD        SURGICAL HISTORY:  Past Surgical History:  Procedure Laterality Date  . COLONOSCOPY W/ POLYPECTOMY    . cyst back  2011  . CYSTOSCOPY  2012   for bleeding in bladder x 3  . MOUTH SURGERY      REVIEW OF SYSTEMS:  Constitutional: negative Eyes: negative Ears, nose, mouth, throat, and face: negative Respiratory: negative Cardiovascular: negative Gastrointestinal: negative Genitourinary:negative Integument/breast: negative Hematologic/lymphatic: negative Musculoskeletal:negative Neurological: negative Behavioral/Psych: negative Endocrine: negative Allergic/Immunologic: negative   PHYSICAL EXAMINATION: General appearance: alert, cooperative, fatigued and no distress Head: Normocephalic, without obvious abnormality, atraumatic Neck: no adenopathy, no JVD, supple, symmetrical, trachea midline and thyroid not enlarged, symmetric, no tenderness/mass/nodules Lymph nodes: Cervical, supraclavicular, and axillary nodes normal. Resp: clear to auscultation bilaterally Back: symmetric, no curvature. ROM normal. No CVA tenderness. Cardio: regular rate and rhythm, S1, S2 normal, no murmur, click, rub or gallop GI: soft, non-tender; bowel sounds normal; no masses,  no organomegaly Extremities: extremities normal, atraumatic, no cyanosis or edema Neurologic: Alert and oriented X 3, normal strength and tone. Normal symmetric reflexes. Normal coordination and  gait  ECOG PERFORMANCE STATUS: 1 - Symptomatic but completely ambulatory  Blood pressure 135/68, pulse 78, temperature 98.1 F (36.7 C), temperature source  Oral, resp. rate 18, height '5\' 6"'$  (1.676 m), weight 177 lb (80.3 kg), SpO2 94 %.  LABORATORY DATA: Lab Results  Component Value Date   WBC 10.2 12/01/2016   HGB 15.8 12/01/2016   HCT 46.6 12/01/2016   MCV 93.5 12/01/2016   PLT 269 12/01/2016      Chemistry      Component Value Date/Time   NA 141 11/17/2016 1501   K 4.0 11/17/2016 1501   CL 101 11/17/2016 1501   CO2 35 (H) 11/17/2016 1501   BUN 16 11/17/2016 1501   CREATININE 0.75 11/17/2016 1501      Component Value Date/Time   CALCIUM 9.6 11/17/2016 1501   ALKPHOS 78 11/17/2016 1501   AST 13 (L) 11/17/2016 1501   ALT 12 (L) 11/17/2016 1501   BILITOT 0.5 11/17/2016 1501       RADIOGRAPHIC STUDIES: Ct Chest Wo Contrast  Result Date: 11/02/2016 CLINICAL DATA:  Recent pneumonia with follow-up exam. EXAM: CT CHEST WITHOUT CONTRAST TECHNIQUE: Multidetector CT imaging of the chest was performed following the standard protocol without IV contrast. COMPARISON:  05/05/2011 FINDINGS: Cardiovascular: Somewhat limited without IV contrast. Aortic calcifications are seen without aneurysmal dilatation. Mild coronary calcifications are noted. No cardiac enlargement is seen. Mediastinum/Nodes: Thoracic inlet demonstrates enlargement of the right lobe of the thyroid with a partially calcified 2.2 cm nodule identified. No hilar or mediastinal adenopathy is identified. The esophagus as visualized is within normal limits. Lungs/Pleura: The lungs are well aerated bilaterally and demonstrate some mild emphysematous changes. In the left upper lobe along the posterior aspect of the major fissure there is a spiculated soft tissue mass lesion consistent with primary pulmonary neoplasm. This measures 3.4 x 3.4 cm secondary to some extension towards the pleura. No other focal abnormality is noted. Upper Abdomen: No acute abnormality noted. Musculoskeletal: No acute bony abnormality is seen. Degenerative changes of the thoracic spine are noted. IMPRESSION:  Spiculated soft tissue mass lesion in the left upper lobe as described. PET-CT and tissue sampling are recommended. These results will be called to the ordering clinician or representative by the Radiologist Assistant, and communication documented in the PACS or zVision Dashboard. Electronically Signed   By: Inez Catalina M.D.   On: 11/02/2016 15:27   Mr Jeri Cos YQ Contrast  Result Date: 11/22/2016 CLINICAL DATA:  Lung cancer, evaluate for brain metastasis. Initial staging. EXAM: MRI HEAD WITHOUT AND WITH CONTRAST TECHNIQUE: Multiplanar, multiecho pulse sequences of the brain and surrounding structures were obtained without and with intravenous contrast. CONTRAST:  8m MULTIHANCE GADOBENATE DIMEGLUMINE 529 MG/ML IV SOLN COMPARISON:  None. FINDINGS: INTRACRANIAL CONTENTS: No reduced diffusion to suggest acute ischemia or hypercellular tumor. Chronic RIGHT cerebellar microhemorrhage. The ventricles and sulci are normal for patient's age. Patchy supratentorial pontine white matter FLAIR T2 hyperintensities. No suspicious parenchymal signal, masses, mass effect. No abnormal intraparenchymal or extra-axial enhancement. No abnormal extra-axial fluid collections. No extra-axial masses. VASCULAR: Normal major intracranial vascular flow voids present at skull base. SKULL AND UPPER CERVICAL SPINE: No abnormal sellar expansion. No suspicious calvarial bone marrow signal. Craniocervical junction maintained. SINUSES/ORBITS: Trace paranasal sinus mucosal thickening. The included ocular globes and orbital contents are non-suspicious. OTHER: None. IMPRESSION: No MR findings of intracranial metastasis. Mild to moderate chronic small vessel ischemic disease. Electronically Signed   By: CElon AlasM.D.   On: 11/22/2016  20:18   Nm Pet Image Initial (pi) Skull Base To Thigh  Result Date: 11/24/2016 CLINICAL DATA:  Initial treatment strategy for left upper lobe lung mass. EXAM: NUCLEAR MEDICINE PET SKULL BASE TO THIGH  TECHNIQUE: 8.9 mCi F-18 FDG was injected intravenously. Full-ring PET imaging was performed from the skull base to thigh after the radiotracer. CT data was obtained and used for attenuation correction and anatomic localization. FASTING BLOOD GLUCOSE:  Value: 98 mg/dl COMPARISON:  Chest CT 11/02/2016 FINDINGS: NECK No hypermetabolic lymph nodes in the neck. Multinodular right thyroid goiter noted. The CHEST The 3.4 x 3.3 cm left upper lobe pulmonary lesion is hypermetabolic with SUV max of 95.09. This is consistent with primary lung neoplasm. No other pulmonary lesions are identified. Mild underlying changes of emphysema are again demonstrated. Small bilateral lymph nodes have normal morphology. Minimally hypermetabolic with SUV max of 1.8. These are likely inflammatory changes. No enlarged or hypermetabolic mediastinal or hilar lymph nodes. ABDOMEN/PELVIS No abnormal hypermetabolic activity within the liver, pancreas, adrenal glands, or spleen. No hypermetabolic lymph nodes in the abdomen or pelvis. Additional findings include advanced atherosclerotic calcifications involving the distal aorta and iliac arteries but no aneurysm. Moderate descending and sigmoid diverticulosis without findings for diverticulitis. 6 mm calculus in the right side of the bladder near the right UVJ but no right-sided hydroureteronephrosis. Moderate prostate gland enlargement and prostate calcifications. Small bilateral inguinal hernias containing fat. There are also bilateral varicoceles. SKELETON No focal hypermetabolic activity to suggest skeletal metastasis. IMPRESSION: 1. 3.4 x 3.3 cm left upper lobe pulmonary lesion is hypermetabolic and consistent with primary lung neoplasm. 2. No mediastinal or hilar lymphadenopathy or findings for metastatic disease. 3. Incidental findings as discussed above. Electronically Signed   By: Marijo Sanes M.D.   On: 11/24/2016 15:45    ASSESSMENT AND PLAN: This is a very pleasant 76 years old white  male with highly suspicious stage IB lung cancer probably non-small cell lung cancer presented with right upper lobe lung mass. The patient had a PET scan as well as MRI of the brain performed recently. I personally and independently reviewed the scan images and discuss the results with the patient and his wife. I had a lengthy discussion with the patient today about his condition and treatment options. His scan showed no evidence for metastatic disease and the only suspicious lesion was the left upper lobe lung mass. I recommended for the patient to see Dr. Roxan Hockey for consideration of surgical resection. I will arrange for him to come back for follow-up visit in 2 weeks after his surgical resection for evaluation and discussion of adjuvant therapy as needed. For COPD he will continue his current treatment with Singulair, Symbicort and albuterol inhaler. The patient was seen during the multidisciplinary thoracic oncology clinic today by medical oncology and thoracic surgery. He was advised to call immediately if he has any concerning symptoms in the interval. The patient voices understanding of current disease status and treatment options and is in agreement with the current care plan.  All questions were answered. The patient knows to call the clinic with any problems, questions or concerns. We can certainly see the patient much sooner if necessary.  I spent 15 minutes counseling the patient face to face. The total time spent in the appointment was 25 minutes.  Disclaimer: This note was dictated with voice recognition software. Similar sounding words can inadvertently be transcribed and may not be corrected upon review.

## 2016-12-01 NOTE — Telephone Encounter (Signed)
NO LOS 4/19

## 2016-12-01 NOTE — Progress Notes (Signed)
PCP is Tivis Ringer, MD Referring Provider is Curt Bears, MD  No chief complaint on file.   HPI: [name] is a 76 yo man with a past history of tobacco abuse (1ppd x 60 years), COPD, OSA, and hyperlipidemia. He recently noted an increased cough. He thought it was due to allergies but went to see Dr. Dagmar Hait. A CXR showed a left upper lobe mass. A CT confirmed a 3.4 cm spiculated mass in the LUL. A PET showed the nodule was hypermetabolic but there was no evidence of regional or distant metastasis. A brain David was negative for metastatic disease as well. He saw Dr. Julien Nordmann who referred him for surgical evaluation.  Walks up 37 steps to get to beach place. Has to stop once to catch his breath. Can walk > 1 mile on level ground without SOB. Denies chest pain , pressure, tightness at rest or with exertion. No change in appetite or weight loss. No HA, change in vision or syncope. Uses O2 at night in lieu of CPAP.  Zubrod Score: At the time of surgery this patient's most appropriate activity status/level should be described as: '[]'$     0    Normal activity, no symptoms '[x]'$     1    Restricted in physical strenuous activity but ambulatory, able to do out light work '[]'$     2    Ambulatory and capable of self care, unable to do work activities, up and about >50 % of waking hours                              '[]'$     3    Only limited self care, in bed greater than 50% of waking hours '[]'$     4    Completely disabled, no self care, confined to bed or chair '[]'$     5    Moribund  Past Medical History:  Diagnosis Date  . COPD (chronic obstructive pulmonary disease) ([place])   . Hyperlipidemia   . Sleep apnea    cannot tolerate cpap    Past Surgical History:  Procedure Laterality Date  . COLONOSCOPY W/ POLYPECTOMY    . cyst back  2011  . CYSTOSCOPY  2012   for bleeding in bladder x 3  . MOUTH SURGERY      Family History  Problem Relation Age of Onset  . Colon cancer Brother   . Emphysema    .  Heart disease    . Cancer    . Heart disease Father   . Heart disease Maternal Uncle   . Heart disease Paternal Uncle     Social History Social History  Substance Use Topics  . Smoking status: Current Every Day Smoker    Packs/day: 0.50    Types: Cigarettes  . Smokeless tobacco: Never Used  . Alcohol use No    Current Outpatient Prescriptions  Medication Sig Dispense Refill  . albuterol (PROVENTIL HFA;VENTOLIN HFA) 108 (90 Base) MCG/ACT inhaler Inhale into the lungs every 6 (six) hours as needed for wheezing or shortness of breath.    Marland Kitchen BREO ELLIPTA 200-25 MCG/INH AEPB Inhale 1 Inhaler into the lungs daily.  2  . budesonide-formoterol (SYMBICORT) 160-4.5 MCG/ACT inhaler Inhale 2 puffs into the lungs 2 (two) times daily.    . montelukast (SINGULAIR) 10 MG tablet Take 10 mg by mouth at bedtime.    . sertraline (ZOLOFT) 50 MG tablet Take 1 tablet by  mouth at bedtime.  5  . simvastatin (ZOCOR) 20 MG tablet Take 20 mg by mouth every evening.    . traZODone (DESYREL) 50 MG tablet Take 50 mg by mouth at bedtime.  2   Current Facility-Administered Medications  Medication Dose Route Frequency Provider Last Rate Last Dose  . 0.9 %  sodium chloride infusion  500 mL Intravenous Continuous Nelida Meuse III, MD        Allergies  Allergen Reactions  . Gadolinium Derivatives Nausea And Vomiting    Pt began immed vomiting post inj of multihance   . Penicillins     "feels like my skin is crawling"    Review of Systems  Constitutional: Negative for activity change, appetite change and unexpected weight change.  HENT: Positive for congestion. Negative for trouble swallowing and voice change.   Eyes: Negative for visual disturbance.  Respiratory: Positive for cough and shortness of breath. Negative for wheezing.   Cardiovascular: Negative for chest pain, palpitations and leg swelling.  Gastrointestinal: Negative for abdominal pain and blood in stool.  Genitourinary: Negative for difficulty  urinating and dysuria.  Musculoskeletal: Negative for arthralgias and myalgias.  Skin:       "thin"  Neurological: Negative for dizziness, seizures, syncope and weakness.  Hematological: Negative for adenopathy. Bruises/bleeds easily.    There were no vitals taken for this visit. Physical Exam  Constitutional: He is oriented to person, place, and time. He appears well-developed and well-nourished. No distress.  HENT:  Head: Normocephalic and atraumatic.  Mouth/Throat: No oropharyngeal exudate.  Eyes: Conjunctivae and EOM are normal. No scleral icterus.  Neck: Neck supple. No thyromegaly present.  Cardiovascular: Normal rate, regular rhythm, normal heart sounds and intact distal pulses.  Exam reveals no gallop and no friction rub.   No murmur heard. Pulmonary/Chest: Effort normal. No respiratory distress. He has no wheezes. He has no rales.  diminished BS bilaterally  Abdominal: Soft. He exhibits no distension. There is no tenderness.  Musculoskeletal: Normal range of motion. He exhibits no edema.  Lymphadenopathy:    He has no cervical adenopathy.  Neurological: He is alert and oriented to person, place, and time. No cranial nerve deficit. Coordination normal.  No motor deficit  Skin: Skin is warm and dry.  Mild clubbing, no cyanosis, multiple ecchymoses  Vitals reviewed.    Diagnostic Tests: CT CHEST WITHOUT CONTRAST  TECHNIQUE: Multidetector CT imaging of the chest was performed following the standard protocol without IV contrast.  COMPARISON:  05/05/2011  FINDINGS: Cardiovascular: Somewhat limited without IV contrast. Aortic calcifications are seen without aneurysmal dilatation. Mild coronary calcifications are noted. No cardiac enlargement is seen.  Mediastinum/Nodes: Thoracic inlet demonstrates enlargement of the right lobe of the thyroid with a partially calcified 2.2 cm nodule identified. No hilar or mediastinal adenopathy is identified. The esophagus as  visualized is within normal limits.  Lungs/Pleura: The lungs are well aerated bilaterally and demonstrate some mild emphysematous changes. In the left upper lobe along the posterior aspect of the major fissure there is a spiculated soft tissue mass lesion consistent with primary pulmonary neoplasm. This measures 3.4 x 3.4 cm secondary to some extension towards the pleura. No other focal abnormality is noted.  Upper Abdomen: No acute abnormality noted.  Musculoskeletal: No acute bony abnormality is seen. Degenerative changes of the thoracic spine are noted.  IMPRESSION: Spiculated soft tissue mass lesion in the left upper lobe as described. PET-CT and tissue sampling are recommended.  These results will be called to the  ordering clinician or representative by the Radiologist Assistant, and communication documented in the PACS or zVision Dashboard.   Electronically Signed   By: Inez Catalina M.D.   On: 11/02/2016 15:27 NUCLEAR MEDICINE PET SKULL BASE TO THIGH  TECHNIQUE: 8.9 mCi F-18 FDG was injected intravenously. Full-ring PET imaging was performed from the skull base to thigh after the radiotracer. CT data was obtained and used for attenuation correction and anatomic localization.  FASTING BLOOD GLUCOSE:  Value: 98 mg/dl  COMPARISON:  Chest CT 11/02/2016  FINDINGS: NECK  No hypermetabolic lymph nodes in the neck. Multinodular right thyroid goiter noted. The  CHEST  The 3.4 x 3.3 cm left upper lobe pulmonary lesion is hypermetabolic with SUV max of 01.75. This is consistent with primary lung neoplasm. No other pulmonary lesions are identified. Mild underlying changes of emphysema are again demonstrated.  Small bilateral lymph nodes have normal morphology. Minimally hypermetabolic with SUV max of 1.8. These are likely inflammatory changes.  No enlarged or hypermetabolic mediastinal or hilar lymph nodes.  ABDOMEN/PELVIS  No abnormal  hypermetabolic activity within the liver, pancreas, adrenal glands, or spleen. No hypermetabolic lymph nodes in the abdomen or pelvis.  Additional findings include advanced atherosclerotic calcifications involving the distal aorta and iliac arteries but no aneurysm. Moderate descending and sigmoid diverticulosis without findings for diverticulitis.  6 mm calculus in the right side of the bladder near the right UVJ but no right-sided hydroureteronephrosis.  Moderate prostate gland enlargement and prostate calcifications.  Small bilateral inguinal hernias containing fat. There are also bilateral varicoceles.  SKELETON  No focal hypermetabolic activity to suggest skeletal metastasis.  IMPRESSION: 1. 3.4 x 3.3 cm left upper lobe pulmonary lesion is hypermetabolic and consistent with primary lung neoplasm. 2. No mediastinal or hilar lymphadenopathy or findings for metastatic disease. 3. Incidental findings as discussed above.   Electronically Signed   By: Marijo Sanes M.D.   On: 11/24/2016 15:45 I personally reviewed the CT and PET CT and concur with the findings noted above  PULMONARY FUNCTION TESTS FVC= 1.77 (50%) FEV1= 0.89(36%)/ 1.01 (41%) DLCO= 11.22(41%)  Impression: 76 yo man with a long history of tobacco abuse who has a 3.4 cm spiculated mass in the left upper lobe that is strongly hypermetabolic on PET. This is almost certainly a new primary bronchogenic carcinoma. I reviewed the images with [name] and reviewed the differential diagnosis. We discussed various ways to establish a diagnosis and treat the mass.  We discussed treating with surgery v radiation. His PFTs are marginal for lobectomy but his functional status is better than the numbers and he should be able to tolerate a segmentectomy. He understands radiation has less morbidity up front but a lower chance of cure. He favors surgery.  I recommended we proceed with left VATS for a lingular  sparing left upper lobectomy for diagnostic and therapeutic purposes.  I discussed the general nature of the procedure, the need for general anesthesia, and the incisions to be used with the [name]. We discussed the expected hospital stay, overall recovery and short and long term outcomes. I informed them of the indications, risks, benefits and alternatives. They understand the risks include, but are not limited to death, stroke, MI, DVT/PE, bleeding, possible need for transfusion, infections, prolonged air leaks, cardiac arrhythmias, as well as other organ system dysfunction including respiratory, renal, or GI complications.   He accepts the risks and agrees to proceed.  Plan: Left VATS for lingular sparing left  upper lobectomy My office will call him to schedule him the week of 12/12/16  [name], MD [facility] [phone]

## 2016-12-05 ENCOUNTER — Other Ambulatory Visit: Payer: Self-pay | Admitting: *Deleted

## 2016-12-05 ENCOUNTER — Telehealth: Payer: Self-pay | Admitting: *Deleted

## 2016-12-05 DIAGNOSIS — R918 Other nonspecific abnormal finding of lung field: Secondary | ICD-10-CM

## 2016-12-05 NOTE — Telephone Encounter (Signed)
Oncology Nurse Navigator Documentation  Oncology Nurse Navigator Flowsheets 12/05/2016  Navigator Location [facility]  Navigator Encounter Type Telephone/I called [name] today to see if he had any questions about upcoming surgery.  He had a question about how long is the procedure.  I updated him on the average length of procedure but this was an average and the time could be shorter or longer.  I explained some of the tubes and lines he will have after surgery.  He was thankful for the call and update.   Telephone Outgoing Call  Treatment Phase Pre-Tx/Tx Discussion  Barriers/Navigation Needs Education  Education Other  Interventions Education  Education Method Verbal  Acuity Level 1  Time Spent with Patient 30

## 2016-12-08 ENCOUNTER — Other Ambulatory Visit: Payer: Self-pay

## 2016-12-08 ENCOUNTER — Encounter (HOSPITAL_COMMUNITY): Payer: Self-pay

## 2016-12-08 ENCOUNTER — Encounter (HOSPITAL_COMMUNITY)
Admission: RE | Admit: 2016-12-08 | Discharge: 2016-12-08 | Disposition: A | Discharge status: Home / Self Care | Payer: Medicare Other | Source: Ambulatory Visit | Attending: Thoracic Surgery (Cardiothoracic Vascular Surgery) | Admitting: Thoracic Surgery (Cardiothoracic Vascular Surgery)

## 2016-12-08 DIAGNOSIS — Z01810 Encounter for preprocedural cardiovascular examination: Secondary | ICD-10-CM | POA: Insufficient documentation

## 2016-12-08 DIAGNOSIS — Z01812 Encounter for preprocedural laboratory examination: Secondary | ICD-10-CM | POA: Insufficient documentation

## 2016-12-08 DIAGNOSIS — R918 Other nonspecific abnormal finding of lung field: Secondary | ICD-10-CM | POA: Diagnosis not present

## 2016-12-08 DIAGNOSIS — R9431 Abnormal electrocardiogram [ECG] [EKG]: Secondary | ICD-10-CM | POA: Diagnosis not present

## 2016-12-08 HISTORY — DX: Major depressive disorder, single episode, unspecified: F32.9

## 2016-12-08 HISTORY — DX: Depression, unspecified: F32.A

## 2016-12-08 HISTORY — DX: Anxiety disorder, unspecified: F41.9

## 2016-12-08 HISTORY — DX: Unspecified osteoarthritis, unspecified site: M19.90

## 2016-12-08 LAB — PROTIME-INR
INR: 1.16
PROTHROMBIN TIME: 14.9 s (ref 11.4–15.2)

## 2016-12-08 LAB — URINALYSIS, ROUTINE W REFLEX MICROSCOPIC
BILIRUBIN URINE: NEGATIVE
GLUCOSE, UA: NEGATIVE mg/dL
Ketones, ur: NEGATIVE mg/dL
NITRITE: NEGATIVE
Protein, ur: NEGATIVE mg/dL
Specific Gravity, Urine: 1.015 (ref 1.005–1.030)
Squamous Epithelial / LPF: NONE SEEN
pH: 5 (ref 5.0–8.0)

## 2016-12-08 LAB — COMPREHENSIVE METABOLIC PANEL
ALBUMIN: 3.7 g/dL (ref 3.5–5.0)
ALT: 17 U/L (ref 17–63)
ANION GAP: 8 (ref 5–15)
AST: 16 U/L (ref 15–41)
Alkaline Phosphatase: 88 U/L (ref 38–126)
BUN: 12 mg/dL (ref 6–20)
CO2: 25 mmol/L (ref 22–32)
Calcium: 9.3 mg/dL (ref 8.9–10.3)
Chloride: 104 mmol/L (ref 101–111)
Creatinine, Ser: 0.64 mg/dL (ref 0.61–1.24)
GFR calc Af Amer: >60 mL/min (ref >60)
GFR calc non Af Amer: >60 mL/min (ref >60)
GLUCOSE: 92 mg/dL (ref 65–99)
POTASSIUM: 4.1 mmol/L (ref 3.5–5.1)
SODIUM: 137 mmol/L (ref 135–145)
TOTAL PROTEIN: 6.6 g/dL (ref 6.5–8.1)
Total Bilirubin: 0.7 mg/dL (ref 0.3–1.2)

## 2016-12-08 LAB — BLOOD GAS, ARTERIAL
ACID-BASE EXCESS: 3.1 mmol/L — AB (ref 0.0–2.0)
BICARBONATE: 27 mmol/L (ref 20.0–28.0)
DRAWN BY: 421801
FIO2: 21
O2 SAT: 97.8 %
PATIENT TEMPERATURE: 98.6
PH ART: 7.436 (ref 7.350–7.450)
pCO2 arterial: 40.8 mmHg (ref 32.0–48.0)
pO2, Arterial: 101 mmHg (ref 83.0–108.0)

## 2016-12-08 LAB — CBC
HCT: 43.9 % (ref 39.0–52.0)
Hemoglobin: 15.1 g/dL (ref 13.0–17.0)
MCH: 31.7 pg (ref 26.0–34.0)
MCHC: 34.4 g/dL (ref 30.0–36.0)
MCV: 92 fL (ref 78.0–100.0)
Platelets: 248 10*3/uL (ref 150–400)
RBC: 4.77 MIL/uL (ref 4.22–5.81)
RDW: 13.5 % (ref 11.5–15.5)
WBC: 9.7 10*3/uL (ref 4.0–10.5)

## 2016-12-08 LAB — SURGICAL PCR SCREEN
MRSA, PCR: NEGATIVE
Staphylococcus aureus: NEGATIVE

## 2016-12-08 LAB — ABO/RH: ABO/RH(D): A NEG

## 2016-12-08 LAB — APTT: APTT: 32 s (ref 24–36)

## 2016-12-08 NOTE — Pre-Procedure Instructions (Signed)
[name]  12/08/2016      [facility] - [place], [place] - [address] [place] [address] Phone: [phone] Fax: [phone]  [facility] - [place], [address] [place] [address] Phone: [phone] Fax: [phone]  [facility] [place], [address] [place] [address] Phone: [phone] Fax: [phone]    Your procedure is scheduled on 12/12/2016- MONDAY  Report to [facility] Admitting at 5:30 A.M.  Call this number if you have problems the morning of surgery:  [phone]   Remember:  Do not eat food or drink liquids after midnight.   Take these medicines the morning of surgery with A SIP OF WATER : use inhaler    Do not wear jewelry   Do not wear lotions, powders, or perfumes, or deoderant.    Men may shave face and neck.   Do not bring valuables to the hospital.   [facility] is not responsible for any belongings or valuables.  Contacts, dentures or bridgework may not be worn into surgery.  Leave your suitcase in the car.  After surgery it may be brought to your room.  For patients admitted to the hospital, discharge time will be determined by your treatment team.  Patients discharged the day of surgery will not be allowed to drive home.   Name and phone number of your driver:   With wife   Special instructions:  Special Instructions: [place] - Preparing for Surgery  Before surgery, you can play an important role.  Because skin is not sterile, your skin needs to be as free of germs as possible.  You can reduce the number of germs on you skin by washing with CHG (chlorahexidine gluconate) soap before surgery.  CHG is an antiseptic cleaner which kills germs and bonds with the skin to  continue killing germs even after washing.  Please DO NOT use if you have an allergy to CHG or antibacterial soaps.  If your skin becomes reddened/irritated stop using the CHG and inform your nurse when you arrive at Short Stay.  Do not shave (including legs and underarms) for at least 48 hours prior to the first CHG shower.  You may shave your face.  Please follow these instructions carefully:   1.  Shower with CHG Soap the night before surgery and the  morning of Surgery.  2.  If you choose to wash your hair, wash your hair first as usual with your  normal shampoo.  3.  After you shampoo, rinse your hair and body thoroughly to remove the  Shampoo.  4.  Use CHG as you would any other liquid soap.  You can apply chg directly to the skin and wash gently with scrungie or a clean washcloth.  5.  Apply the CHG Soap to your body ONLY FROM THE NECK DOWN.    Do not use on open wounds or open sores.  Avoid contact with your eyes, ears, mouth and genitals (private parts).  Wash genitals (private parts)   with your normal soap.  6.  Wash thoroughly, paying special attention to the area where your surgery will be performed.  7.  Thoroughly rinse your body with warm water from the neck  down.  8.  DO NOT shower/wash with your normal soap after using and rinsing off   the CHG Soap.  9.  Pat yourself dry with a clean towel.            10.  Wear clean pajamas.            11.  Place clean sheets on your bed the night of your first shower and do not sleep with pets.  Day of Surgery  Do not apply any lotions/deodorants the morning of surgery.  Please wear clean clothes to the hospital/surgery center.  Please read over the following fact sheets that you were given. Pain Booklet, Coughing and Deep Breathing, MRSA Information and Surgical Site Infection Prevention

## 2016-12-08 NOTE — Progress Notes (Signed)
Pt. Denies chest concerns today & remarks that he doesn't have the congestion now that took him to see Dr. Dagmar Hait. Pt. Uses O2 nasally q night in place of CPAP because he was intolerant of the CPAP.  Pt. Has been fitted for an oral appliance to be used with CPAP but he has not used it yet. He reports that an oral problem arouse with his teeth that has interrupted  the use of the oral device. Pt. Reports that he was seeing Dr. Doreatha Lew until Dr. Doreatha Lew retired.Pt. Started seeing Dr. Dagmar Hait after Dr. Doreatha Lew retired .  While he was a pt. With Dr. Doreatha Lew he had some cardiac studies ( unsure of date) . Records have been requested by fax to Dr. Dagmar Hait for his last OV note & also his last EKG. Also requested  (Dr. Doreatha Lew) cardiac records from Dr. Dagmar Hait if they are available.

## 2016-12-09 NOTE — Progress Notes (Signed)
Anesthesia Chart Review:  Pt is a 76 year old male scheduled for VATS, LU lobectomy on 12/12/2016 with Modesto Charon, M.D.  - PCP is [name], MD.   PMH includes: Upper lipidemia, COPD, OSA. Former smoker (quit 11/29/16). BMI 29.  Medications include: Albuterol, Breo Ellipta, simvastatin.  Preoperative labs reviewed.  CXR will be done DOS.  EKG 12/08/16: NSR. RBBB.  I spoke to pt by telephone.  He reports seeing a Dr. Doreatha Lew with cardiology many years ago for a "check up".  Some tests were done and Dr. Doreatha Lew told pt he did not need to follow up with cardiology.  Denies CV symptoms.   If no changes, I anticipate pt can proceed with surgery as scheduled.   Willeen Cass, FNP-BC [facility] Short Stay Surgical Center/Anesthesiology Phone: [phone] 12/09/2016 1:02 PM

## 2016-12-12 ENCOUNTER — Encounter (HOSPITAL_COMMUNITY)
Admission: RE | Disposition: A | Discharge status: Home / Self Care | Payer: Self-pay | Source: Ambulatory Visit | Attending: Thoracic Surgery (Cardiothoracic Vascular Surgery)

## 2016-12-12 ENCOUNTER — Inpatient Hospital Stay (HOSPITAL_COMMUNITY): Payer: Medicare Other

## 2016-12-12 ENCOUNTER — Inpatient Hospital Stay (HOSPITAL_COMMUNITY)
Admission: RE | Admit: 2016-12-12 | Discharge: 2016-12-17 | DRG: 164 | Disposition: A | Discharge status: Home / Self Care | Payer: Medicare Other | Source: Ambulatory Visit | Attending: Thoracic Surgery (Cardiothoracic Vascular Surgery) | Admitting: Thoracic Surgery (Cardiothoracic Vascular Surgery)

## 2016-12-12 ENCOUNTER — Encounter (HOSPITAL_COMMUNITY): Payer: Self-pay | Admitting: *Deleted

## 2016-12-12 ENCOUNTER — Inpatient Hospital Stay (HOSPITAL_COMMUNITY): Payer: Medicare Other | Admitting: Certified Registered Nurse Anesthetist

## 2016-12-12 ENCOUNTER — Inpatient Hospital Stay (HOSPITAL_COMMUNITY): Payer: Medicare Other | Admitting: Emergency Medicine

## 2016-12-12 DIAGNOSIS — J45909 Unspecified asthma, uncomplicated: Secondary | ICD-10-CM | POA: Diagnosis not present

## 2016-12-12 DIAGNOSIS — J9811 Atelectasis: Secondary | ICD-10-CM | POA: Diagnosis not present

## 2016-12-12 DIAGNOSIS — N390 Urinary tract infection, site not specified: Secondary | ICD-10-CM | POA: Diagnosis present

## 2016-12-12 DIAGNOSIS — Z9981 Dependence on supplemental oxygen: Secondary | ICD-10-CM

## 2016-12-12 DIAGNOSIS — Z800 Family history of malignant neoplasm of digestive organs: Secondary | ICD-10-CM

## 2016-12-12 DIAGNOSIS — E785 Hyperlipidemia, unspecified: Secondary | ICD-10-CM | POA: Diagnosis present

## 2016-12-12 DIAGNOSIS — E872 Acidosis: Secondary | ICD-10-CM | POA: Diagnosis not present

## 2016-12-12 DIAGNOSIS — Z9689 Presence of other specified functional implants: Secondary | ICD-10-CM

## 2016-12-12 DIAGNOSIS — Z79899 Other long term (current) drug therapy: Secondary | ICD-10-CM

## 2016-12-12 DIAGNOSIS — Z8249 Family history of ischemic heart disease and other diseases of the circulatory system: Secondary | ICD-10-CM | POA: Diagnosis not present

## 2016-12-12 DIAGNOSIS — R222 Localized swelling, mass and lump, trunk: Secondary | ICD-10-CM | POA: Diagnosis not present

## 2016-12-12 DIAGNOSIS — R911 Solitary pulmonary nodule: Secondary | ICD-10-CM | POA: Diagnosis not present

## 2016-12-12 DIAGNOSIS — J939 Pneumothorax, unspecified: Secondary | ICD-10-CM

## 2016-12-12 DIAGNOSIS — C3412 Malignant neoplasm of upper lobe, left bronchus or lung: Secondary | ICD-10-CM | POA: Diagnosis not present

## 2016-12-12 DIAGNOSIS — Z4682 Encounter for fitting and adjustment of non-vascular catheter: Secondary | ICD-10-CM | POA: Diagnosis not present

## 2016-12-12 DIAGNOSIS — Z902 Acquired absence of lung [part of]: Secondary | ICD-10-CM

## 2016-12-12 DIAGNOSIS — J811 Chronic pulmonary edema: Secondary | ICD-10-CM | POA: Diagnosis not present

## 2016-12-12 DIAGNOSIS — J449 Chronic obstructive pulmonary disease, unspecified: Secondary | ICD-10-CM | POA: Diagnosis not present

## 2016-12-12 DIAGNOSIS — Z87891 Personal history of nicotine dependence: Secondary | ICD-10-CM

## 2016-12-12 DIAGNOSIS — G4733 Obstructive sleep apnea (adult) (pediatric): Secondary | ICD-10-CM | POA: Diagnosis present

## 2016-12-12 DIAGNOSIS — J9382 Other air leak: Secondary | ICD-10-CM | POA: Diagnosis not present

## 2016-12-12 DIAGNOSIS — R918 Other nonspecific abnormal finding of lung field: Secondary | ICD-10-CM

## 2016-12-12 DIAGNOSIS — R0602 Shortness of breath: Secondary | ICD-10-CM | POA: Diagnosis not present

## 2016-12-12 HISTORY — PX: VIDEO ASSISTED THORACOSCOPY (VATS)/ LOBECTOMY: SHX6169

## 2016-12-12 LAB — POCT I-STAT 3, ART BLOOD GAS (G3+)
ACID-BASE EXCESS: 6 mmol/L — AB (ref 0.0–2.0)
ACID-BASE EXCESS: 2 mmol/L (ref 0.0–2.0)
Acid-Base Excess: 2 mmol/L (ref 0.0–2.0)
Bicarbonate: 37 mmol/L — ABNORMAL HIGH (ref 20.0–28.0)
Bicarbonate: 30.1 mmol/L — ABNORMAL HIGH (ref 20.0–28.0)
Bicarbonate: 31.2 mmol/L — ABNORMAL HIGH (ref 20.0–28.0)
O2 Saturation: 100 %
O2 Saturation: 89 %
O2 Saturation: 94 %
PCO2 ART: 63 mmHg — AB (ref 32.0–48.0)
PH ART: 7.251 — AB (ref 7.350–7.450)
Patient temperature: 98.7
TCO2: 40 mmol/L (ref 0–100)
TCO2: 32 mmol/L (ref 0–100)
TCO2: 33 mmol/L (ref 0–100)
pCO2 arterial: 82.9 mmHg (ref 32.0–48.0)
pCO2 arterial: 70.3 mmHg (ref 32.0–48.0)
pH, Arterial: 7.254 — ABNORMAL LOW (ref 7.350–7.450)
pH, Arterial: 7.287 — ABNORMAL LOW (ref 7.350–7.450)
pO2, Arterial: 389 mmHg — ABNORMAL HIGH (ref 83.0–108.0)
pO2, Arterial: 65 mmHg — ABNORMAL LOW (ref 83.0–108.0)
pO2, Arterial: 82 mmHg — ABNORMAL LOW (ref 83.0–108.0)

## 2016-12-12 LAB — GLUCOSE, CAPILLARY
GLUCOSE-CAPILLARY: 139 mg/dL — AB (ref 65–99)
Glucose-Capillary: 151 mg/dL — ABNORMAL HIGH (ref 65–99)

## 2016-12-12 LAB — PREPARE RBC (CROSSMATCH)

## 2016-12-12 SURGERY — VIDEO ASSISTED THORACOSCOPY (VATS)/ LOBECTOMY
Anesthesia: General | Site: Chest | Laterality: Left

## 2016-12-12 MED ORDER — FENTANYL 2500MCG IN NS 250ML (10MCG/ML) PREMIX INFUSION
INTRAVENOUS | Status: AC
  Filled 2016-12-12: qty 250

## 2016-12-12 MED ORDER — MIDAZOLAM HCL 2 MG/2ML IJ SOLN
INTRAMUSCULAR | Status: AC
Start: 2016-12-12 — End: 2016-12-12
  Filled 2016-12-12: qty 2

## 2016-12-12 MED ORDER — ONDANSETRON HCL 4 MG/2ML IJ SOLN: 4.0000 mg | Freq: Four times a day (QID) | INTRAMUSCULAR | Status: DC | PRN

## 2016-12-12 MED ORDER — HYDROMORPHONE HCL 1 MG/ML IJ SOLN
0.2500 mg | INTRAMUSCULAR | Status: DC | PRN
  Administered 2016-12-12 (×2): 0.5 mg via INTRAVENOUS

## 2016-12-12 MED ORDER — DIPHENHYDRAMINE HCL 12.5 MG/5ML PO ELIX: 12.5000 mg | ORAL_SOLUTION | Freq: Four times a day (QID) | ORAL | Status: DC | PRN

## 2016-12-12 MED ORDER — ROCURONIUM BROMIDE 10 MG/ML (PF) SYRINGE
PREFILLED_SYRINGE | INTRAVENOUS | Status: AC
  Filled 2016-12-12: qty 5

## 2016-12-12 MED ORDER — HYDROMORPHONE HCL 1 MG/ML IJ SOLN
INTRAMUSCULAR | Status: AC
  Administered 2016-12-12: 0.5 mg via INTRAVENOUS
  Filled 2016-12-12: qty 0.5

## 2016-12-12 MED ORDER — KCL IN DEXTROSE-NACL 20-5-0.9 MEQ/L-%-% IV SOLN
INTRAVENOUS | Status: DC
  Administered 2016-12-12 – 2016-12-13 (×3): via INTRAVENOUS
  Filled 2016-12-12 (×3): qty 1000

## 2016-12-12 MED ORDER — LACTATED RINGERS IV SOLN
INTRAVENOUS | Status: DC | PRN
  Administered 2016-12-12: 07:00:00 via INTRAVENOUS

## 2016-12-12 MED ORDER — PHENYLEPHRINE HCL 10 MG/ML IJ SOLN
INTRAMUSCULAR | Status: DC | PRN
  Administered 2016-12-12: 80 ug via INTRAVENOUS

## 2016-12-12 MED ORDER — BISACODYL 5 MG PO TBEC
10.0000 mg | DELAYED_RELEASE_TABLET | Freq: Every day | ORAL | Status: DC
  Administered 2016-12-13 – 2016-12-17 (×5): 10 mg via ORAL
  Filled 2016-12-12 (×5): qty 2

## 2016-12-12 MED ORDER — DIPHENHYDRAMINE HCL 50 MG/ML IJ SOLN: 12.5000 mg | Freq: Four times a day (QID) | INTRAMUSCULAR | Status: DC | PRN

## 2016-12-12 MED ORDER — PANTOPRAZOLE SODIUM 40 MG PO TBEC
40.0000 mg | DELAYED_RELEASE_TABLET | Freq: Every day | ORAL | Status: DC
  Administered 2016-12-12 – 2016-12-17 (×6): 40 mg via ORAL
  Filled 2016-12-12 (×6): qty 1

## 2016-12-12 MED ORDER — SODIUM CHLORIDE 0.9 % IV SOLN: Freq: Once | INTRAVENOUS | Status: DC

## 2016-12-12 MED ORDER — MIDAZOLAM HCL 5 MG/5ML IJ SOLN
INTRAMUSCULAR | Status: DC | PRN
  Administered 2016-12-12: 1 mg via INTRAVENOUS

## 2016-12-12 MED ORDER — BUPIVACAINE HCL (PF) 0.5 % IJ SOLN
INTRAMUSCULAR | Status: DC | PRN
  Administered 2016-12-12: 30 mL

## 2016-12-12 MED ORDER — PHENYLEPHRINE HCL 10 MG/ML IJ SOLN
INTRAVENOUS | Status: DC | PRN
  Administered 2016-12-12: 10 ug/min via INTRAVENOUS

## 2016-12-12 MED ORDER — ACETAMINOPHEN 160 MG/5ML PO SOLN: 1000.0000 mg | Freq: Four times a day (QID) | ORAL | Status: AC

## 2016-12-12 MED ORDER — FENTANYL CITRATE (PF) 100 MCG/2ML IJ SOLN
INTRAMUSCULAR | Status: DC | PRN
  Administered 2016-12-12: 150 ug via INTRAVENOUS
  Administered 2016-12-12 (×4): 50 ug via INTRAVENOUS
  Administered 2016-12-12: 25 ug via INTRAVENOUS
  Administered 2016-12-12: 50 ug via INTRAVENOUS
  Administered 2016-12-12: 100 ug via INTRAVENOUS
  Administered 2016-12-12: 25 ug via INTRAVENOUS
  Administered 2016-12-12 (×4): 50 ug via INTRAVENOUS

## 2016-12-12 MED ORDER — LEVOFLOXACIN 500 MG PO TABS
500.0000 mg | ORAL_TABLET | Freq: Every day | ORAL | Status: AC
Start: 2016-12-12 — End: 2016-12-16
  Administered 2016-12-12 – 2016-12-16 (×5): 500 mg via ORAL
  Filled 2016-12-12 (×5): qty 1

## 2016-12-12 MED ORDER — TRAMADOL HCL 50 MG PO TABS
50.0000 mg | ORAL_TABLET | Freq: Four times a day (QID) | ORAL | Status: DC | PRN
  Filled 2016-12-12: qty 1

## 2016-12-12 MED ORDER — FENTANYL CITRATE (PF) 250 MCG/5ML IJ SOLN
INTRAMUSCULAR | Status: AC
  Filled 2016-12-12: qty 5

## 2016-12-12 MED ORDER — SODIUM CHLORIDE 0.9 % IV SOLN
30.0000 meq | Freq: Every day | INTRAVENOUS | Status: DC | PRN
  Filled 2016-12-12: qty 15

## 2016-12-12 MED ORDER — NALOXONE HCL 0.4 MG/ML IJ SOLN
0.4000 mg | INTRAMUSCULAR | Status: DC | PRN
  Administered 2016-12-12: 0.4 mg via INTRAVENOUS
  Filled 2016-12-12: qty 1

## 2016-12-12 MED ORDER — MONTELUKAST SODIUM 10 MG PO TABS
10.0000 mg | ORAL_TABLET | Freq: Every day | ORAL | Status: DC
  Administered 2016-12-12 – 2016-12-16 (×5): 10 mg via ORAL
  Filled 2016-12-12 (×5): qty 1

## 2016-12-12 MED ORDER — BUPIVACAINE 0.5 % ON-Q PUMP SINGLE CATH 400 ML
INJECTION | Status: DC | PRN
  Administered 2016-12-12: 400 mL

## 2016-12-12 MED ORDER — ONDANSETRON HCL 4 MG/2ML IJ SOLN
4.0000 mg | Freq: Four times a day (QID) | INTRAMUSCULAR | Status: DC | PRN
Start: 2016-12-12 — End: 2016-12-12

## 2016-12-12 MED ORDER — NALOXONE HCL 0.4 MG/ML IJ SOLN: 0.4000 mg | INTRAMUSCULAR | Status: DC | PRN

## 2016-12-12 MED ORDER — PROPOFOL 10 MG/ML IV BOLUS
INTRAVENOUS | Status: AC
  Filled 2016-12-12: qty 20

## 2016-12-12 MED ORDER — ORAL CARE MOUTH RINSE
15.0000 mL | Freq: Two times a day (BID) | OROMUCOSAL | Status: DC
  Administered 2016-12-12 – 2016-12-15 (×6): 15 mL via OROMUCOSAL

## 2016-12-12 MED ORDER — INSULIN ASPART 100 UNIT/ML ~~LOC~~ SOLN
0.0000 [IU] | Freq: Four times a day (QID) | SUBCUTANEOUS | Status: DC
Start: 2016-12-12 — End: 2016-12-13
  Administered 2016-12-13 (×2): 2 [IU] via SUBCUTANEOUS

## 2016-12-12 MED ORDER — TRAZODONE HCL 50 MG PO TABS
50.0000 mg | ORAL_TABLET | Freq: Every day | ORAL | Status: DC
  Administered 2016-12-13 – 2016-12-16 (×4): 50 mg via ORAL
  Filled 2016-12-12 (×5): qty 1

## 2016-12-12 MED ORDER — PHENYLEPHRINE HCL 10 MG/ML IJ SOLN: INTRAVENOUS | Status: DC | PRN

## 2016-12-12 MED ORDER — SUGAMMADEX SODIUM 200 MG/2ML IV SOLN
INTRAVENOUS | Status: AC
  Filled 2016-12-12: qty 2

## 2016-12-12 MED ORDER — OXYCODONE HCL 5 MG PO TABS
5.0000 mg | ORAL_TABLET | ORAL | Status: DC | PRN
  Administered 2016-12-12: 10 mg via ORAL

## 2016-12-12 MED ORDER — HYDROMORPHONE HCL 1 MG/ML IJ SOLN
INTRAMUSCULAR | Status: AC
  Filled 2016-12-12: qty 0.5

## 2016-12-12 MED ORDER — SERTRALINE HCL 50 MG PO TABS
50.0000 mg | ORAL_TABLET | Freq: Every day | ORAL | Status: DC
  Administered 2016-12-12 – 2016-12-16 (×5): 50 mg via ORAL
  Filled 2016-12-12 (×5): qty 1

## 2016-12-12 MED ORDER — SIMVASTATIN 20 MG PO TABS
20.0000 mg | ORAL_TABLET | Freq: Every evening | ORAL | Status: DC
  Administered 2016-12-12 – 2016-12-16 (×5): 20 mg via ORAL
  Filled 2016-12-12 (×5): qty 1

## 2016-12-12 MED ORDER — BUPIVACAINE ON-Q PAIN PUMP (FOR ORDER SET NO CHG)
INJECTION | Status: DC
  Filled 2016-12-12: qty 1

## 2016-12-12 MED ORDER — LIDOCAINE 2% (20 MG/ML) 5 ML SYRINGE
INTRAMUSCULAR | Status: AC
  Filled 2016-12-12: qty 5

## 2016-12-12 MED ORDER — FLUTICASONE FUROATE-VILANTEROL 200-25 MCG/INH IN AEPB
1.0000 | INHALATION_SPRAY | Freq: Every day | RESPIRATORY_TRACT | Status: DC
  Administered 2016-12-13 – 2016-12-17 (×5): 1 via RESPIRATORY_TRACT
  Filled 2016-12-12 (×2): qty 28

## 2016-12-12 MED ORDER — OXYCODONE HCL 5 MG/5ML PO SOLN: 5.0000 mg | Freq: Once | ORAL | Status: DC | PRN

## 2016-12-12 MED ORDER — KETOROLAC TROMETHAMINE 15 MG/ML IJ SOLN
15.0000 mg | Freq: Once | INTRAMUSCULAR | Status: AC
  Administered 2016-12-12: 15 mg via INTRAVENOUS
  Filled 2016-12-12: qty 1

## 2016-12-12 MED ORDER — ONDANSETRON HCL 4 MG/2ML IJ SOLN
INTRAMUSCULAR | Status: DC | PRN
  Administered 2016-12-12: 4 mg via INTRAVENOUS

## 2016-12-12 MED ORDER — OXYCODONE HCL 5 MG PO TABS
ORAL_TABLET | ORAL | Status: AC
  Filled 2016-12-12: qty 2

## 2016-12-12 MED ORDER — LIDOCAINE HCL (CARDIAC) 20 MG/ML IV SOLN
INTRAVENOUS | Status: DC | PRN
  Administered 2016-12-12: 60 mg via INTRAVENOUS

## 2016-12-12 MED ORDER — SUGAMMADEX SODIUM 200 MG/2ML IV SOLN
INTRAVENOUS | Status: DC | PRN
  Administered 2016-12-12: 175 mg via INTRAVENOUS

## 2016-12-12 MED ORDER — SODIUM CHLORIDE 0.9% FLUSH: 9.0000 mL | INTRAVENOUS | Status: DC | PRN

## 2016-12-12 MED ORDER — LACTATED RINGERS IV SOLN
INTRAVENOUS | Status: DC | PRN
  Administered 2016-12-12 (×2): via INTRAVENOUS

## 2016-12-12 MED ORDER — BUPIVACAINE 0.5 % ON-Q PUMP SINGLE CATH 400 ML
400.0000 mL | INJECTION | Status: AC
  Filled 2016-12-12: qty 400

## 2016-12-12 MED ORDER — ALBUMIN HUMAN 5 % IV SOLN
INTRAVENOUS | Status: DC | PRN
  Administered 2016-12-12: 11:00:00 via INTRAVENOUS

## 2016-12-12 MED ORDER — ONDANSETRON HCL 4 MG/2ML IJ SOLN
INTRAMUSCULAR | Status: AC
  Filled 2016-12-12: qty 2

## 2016-12-12 MED ORDER — VANCOMYCIN HCL IN DEXTROSE 1-5 GM/200ML-% IV SOLN
1000.0000 mg | INTRAVENOUS | Status: AC
  Administered 2016-12-12: 1000 mg via INTRAVENOUS
  Filled 2016-12-12: qty 200

## 2016-12-12 MED ORDER — PROPOFOL 10 MG/ML IV BOLUS
INTRAVENOUS | Status: DC | PRN
  Administered 2016-12-12: 120 mg via INTRAVENOUS
  Administered 2016-12-12: 10 mg via INTRAVENOUS
  Administered 2016-12-12: 40 mg via INTRAVENOUS

## 2016-12-12 MED ORDER — ACETAMINOPHEN 500 MG PO TABS
1000.0000 mg | ORAL_TABLET | Freq: Four times a day (QID) | ORAL | Status: AC
  Administered 2016-12-12 – 2016-12-17 (×15): 1000 mg via ORAL
  Filled 2016-12-12 (×15): qty 2

## 2016-12-12 MED ORDER — HEMOSTATIC AGENTS (NO CHARGE) OPTIME
TOPICAL | Status: DC | PRN
  Administered 2016-12-12: 1 via TOPICAL

## 2016-12-12 MED ORDER — SENNOSIDES-DOCUSATE SODIUM 8.6-50 MG PO TABS
1.0000 | ORAL_TABLET | Freq: Every day | ORAL | Status: DC
  Administered 2016-12-12 – 2016-12-15 (×4): 1 via ORAL
  Filled 2016-12-12 (×5): qty 1

## 2016-12-12 MED ORDER — FENTANYL 40 MCG/ML IV SOLN
INTRAVENOUS | Status: DC
  Administered 2016-12-12: 15:00:00 via INTRAVENOUS
  Administered 2016-12-12: 30 ug via INTRAVENOUS
  Administered 2016-12-13: 80 ug via INTRAVENOUS
  Administered 2016-12-13: 110 ug via INTRAVENOUS
  Administered 2016-12-13: 70 ug via INTRAVENOUS
  Administered 2016-12-13: 110 ug via INTRAVENOUS
  Administered 2016-12-13: 40 ug via INTRAVENOUS
  Administered 2016-12-13: 30 ug via INTRAVENOUS
  Administered 2016-12-13: 90 ug via INTRAVENOUS
  Administered 2016-12-14: 10 ug via INTRAVENOUS
  Administered 2016-12-14: 22:00:00 via INTRAVENOUS
  Administered 2016-12-14: 30 ug via INTRAVENOUS
  Administered 2016-12-14: 70 ug via INTRAVENOUS
  Administered 2016-12-14: 20 ug via INTRAVENOUS
  Administered 2016-12-14 – 2016-12-15 (×2): 30 ug via INTRAVENOUS
  Administered 2016-12-15: 50 ug via INTRAVENOUS
  Administered 2016-12-15: 30 ug via INTRAVENOUS
  Administered 2016-12-15: 10 ug via INTRAVENOUS
  Administered 2016-12-15: 20 ug via INTRAVENOUS
  Administered 2016-12-15 – 2016-12-16 (×2): 10 ug via INTRAVENOUS
  Administered 2016-12-16: 30 ug via INTRAVENOUS
  Filled 2016-12-12 (×3): qty 25

## 2016-12-12 MED ORDER — VANCOMYCIN HCL IN DEXTROSE 1-5 GM/200ML-% IV SOLN
1000.0000 mg | Freq: Two times a day (BID) | INTRAVENOUS | Status: AC
  Administered 2016-12-12: 1000 mg via INTRAVENOUS
  Filled 2016-12-12: qty 200

## 2016-12-12 MED ORDER — ROCURONIUM BROMIDE 100 MG/10ML IV SOLN
INTRAVENOUS | Status: DC | PRN
  Administered 2016-12-12 (×2): 20 mg via INTRAVENOUS
  Administered 2016-12-12: 5 mg via INTRAVENOUS
  Administered 2016-12-12 (×3): 10 mg via INTRAVENOUS
  Administered 2016-12-12: 50 mg via INTRAVENOUS

## 2016-12-12 MED ORDER — BUPIVACAINE HCL (PF) 0.5 % IJ SOLN
INTRAMUSCULAR | Status: AC
  Filled 2016-12-12: qty 30

## 2016-12-12 MED ORDER — LEVALBUTEROL HCL 0.63 MG/3ML IN NEBU
0.6300 mg | INHALATION_SOLUTION | Freq: Three times a day (TID) | RESPIRATORY_TRACT | Status: DC | PRN
  Administered 2016-12-12 – 2016-12-13 (×2): 0.63 mg via RESPIRATORY_TRACT
  Filled 2016-12-12 (×2): qty 3

## 2016-12-12 MED ORDER — EPHEDRINE SULFATE 50 MG/ML IJ SOLN
INTRAMUSCULAR | Status: DC | PRN
  Administered 2016-12-12 (×2): 5 mg via INTRAVENOUS

## 2016-12-12 MED ORDER — OXYCODONE HCL 5 MG PO TABS: 5.0000 mg | ORAL_TABLET | Freq: Once | ORAL | Status: DC | PRN

## 2016-12-12 MED ORDER — 0.9 % SODIUM CHLORIDE (POUR BTL) OPTIME
TOPICAL | Status: DC | PRN
  Administered 2016-12-12: 2000 mL

## 2016-12-12 SURGICAL SUPPLY — 74 items
ADH SKN CLS APL DERMABOND .7 (GAUZE/BANDAGES/DRESSINGS) ×1
BAG SPEC RTRVL LRG 6X4 10 (ENDOMECHANICALS) ×1
CANISTER SUCT 3000ML PPV (MISCELLANEOUS) ×3 IMPLANT
CATH KIT ON Q 5IN SLV (PAIN MANAGEMENT) ×2 IMPLANT
CATH KIT ON-Q SILVERSOAK 5 (CATHETERS) IMPLANT
CATH KIT ON-Q SILVERSOAK 5IN (CATHETERS) ×3 IMPLANT
CATH THORACIC 28FR (CATHETERS) ×2 IMPLANT
CLIP TI MEDIUM 6 (CLIP) ×3 IMPLANT
CONN ST 1/4X3/8  BEN (MISCELLANEOUS) ×2
CONN ST 1/4X3/8 BEN (MISCELLANEOUS) IMPLANT
CONT SPEC 4OZ CLIKSEAL STRL BL (MISCELLANEOUS) ×22 IMPLANT
DERMABOND ADVANCED (GAUZE/BANDAGES/DRESSINGS) ×2
DERMABOND ADVANCED .7 DNX12 (GAUZE/BANDAGES/DRESSINGS) IMPLANT
DRAIN CHANNEL 28F RND 3/8 FF (WOUND CARE) ×3 IMPLANT
DRAPE LAPAROSCOPIC ABDOMINAL (DRAPES) ×3 IMPLANT
DRAPE WARM FLUID 44X44 (DRAPE) ×3 IMPLANT
ELECT BLADE 6.5 EXT (BLADE) ×3 IMPLANT
ELECT REM PT RETURN 9FT ADLT (ELECTROSURGICAL) ×3
ELECTRODE REM PT RTRN 9FT ADLT (ELECTROSURGICAL) ×1 IMPLANT
GAUZE SPONGE 4X4 12PLY STRL (GAUZE/BANDAGES/DRESSINGS) ×3 IMPLANT
GAUZE SPONGE 4X4 12PLY STRL LF (GAUZE/BANDAGES/DRESSINGS) ×2 IMPLANT
GLOVE BIOGEL PI IND STRL 6.5 (GLOVE) ×1 IMPLANT
GLOVE BIOGEL PI INDICATOR 6.5 (GLOVE) ×2
GLOVE ECLIPSE 6.5 STRL STRAW (GLOVE) ×2 IMPLANT
GLOVE SURG SIGNA 7.5 PF LTX (GLOVE) ×6 IMPLANT
GOWN STRL REUS W/ TWL LRG LVL3 (GOWN DISPOSABLE) ×2 IMPLANT
GOWN STRL REUS W/ TWL XL LVL3 (GOWN DISPOSABLE) ×1 IMPLANT
GOWN STRL REUS W/TWL LRG LVL3 (GOWN DISPOSABLE) ×6
GOWN STRL REUS W/TWL XL LVL3 (GOWN DISPOSABLE) ×3
HEMOSTAT SURGICEL 2X14 (HEMOSTASIS) ×3 IMPLANT
KIT BASIN OR (CUSTOM PROCEDURE TRAY) ×3 IMPLANT
KIT ROOM TURNOVER OR (KITS) ×3 IMPLANT
NS IRRIG 1000ML POUR BTL (IV SOLUTION) ×9 IMPLANT
PACK CHEST (CUSTOM PROCEDURE TRAY) ×3 IMPLANT
PAD ARMBOARD 7.5X6 YLW CONV (MISCELLANEOUS) ×6 IMPLANT
POUCH ENDO CATCH II 15MM (MISCELLANEOUS) ×3 IMPLANT
POUCH SPECIMEN RETRIEVAL 10MM (ENDOMECHANICALS) ×2 IMPLANT
RELOAD STAPLE 35X2.5 WHT THIN (STAPLE) IMPLANT
RELOAD STAPLE 60 3.8 GOLD REG (STAPLE) IMPLANT
RELOAD STAPLE 60 4.1 GRN THCK (STAPLE) IMPLANT
RELOAD STAPLE 60 BLK VRY/THCK (STAPLE) IMPLANT
RELOAD STAPLER 60MM BLK (STAPLE) ×4 IMPLANT
RELOAD STAPLER GOLD 60MM (STAPLE) ×7 IMPLANT
RELOAD STAPLER GREEN 60MM (STAPLE) ×3 IMPLANT
SOLUTION ANTI FOG 6CC (MISCELLANEOUS) ×3 IMPLANT
SPECIMEN JAR MEDIUM (MISCELLANEOUS) ×3 IMPLANT
SPONGE INTESTINAL PEANUT (DISPOSABLE) ×14 IMPLANT
SPONGE TONSIL 1 RF SGL (DISPOSABLE) ×3 IMPLANT
STAPLE ECHEON FLEX 60 POW ENDO (STAPLE) ×2 IMPLANT
STAPLE RELOAD 2.5MM WHITE (STAPLE) ×15 IMPLANT
STAPLER RELOAD 60MM BLK (STAPLE) ×12
STAPLER RELOAD GOLD 60MM (STAPLE) ×21
STAPLER RELOAD GREEN 60MM (STAPLE) ×9
STAPLER VASCULAR ECHELON 35 (CUTTER) ×2 IMPLANT
SUT PROLENE 4 0 RB 1 (SUTURE) ×3
SUT PROLENE 4-0 RB1 .5 CRCL 36 (SUTURE) IMPLANT
SUT SILK  1 MH (SUTURE) ×4
SUT SILK 1 MH (SUTURE) ×2 IMPLANT
SUT SILK 1 TIES 10X30 (SUTURE) ×3 IMPLANT
SUT SILK 3 0 SH 30 (SUTURE) IMPLANT
SUT SILK 3 0SH CR/8 30 (SUTURE) ×3 IMPLANT
SUT VIC AB 1 CTX 36 (SUTURE) ×3
SUT VIC AB 1 CTX36XBRD ANBCTR (SUTURE) ×1 IMPLANT
SUT VIC AB 2-0 CTX 36 (SUTURE) ×3 IMPLANT
SUT VIC AB 3-0 X1 27 (SUTURE) ×5 IMPLANT
SYR 10ML LL (SYRINGE) ×3 IMPLANT
SYSTEM SAHARA CHEST DRAIN ATS (WOUND CARE) ×3 IMPLANT
TAPE CLOTH 4X10 WHT NS (GAUZE/BANDAGES/DRESSINGS) ×3 IMPLANT
TAPE CLOTH SURG 6X10 WHT LF (GAUZE/BANDAGES/DRESSINGS) ×2 IMPLANT
TRAY FOLEY W/METER SILVER 16FR (SET/KITS/TRAYS/PACK) ×3 IMPLANT
TROCAR FLEXIPATH THORACIC 15MM (ENDOMECHANICALS) ×2 IMPLANT
TROCAR XCEL BLADELESS 5X75MML (TROCAR) ×3 IMPLANT
TUNNELER SHEATH ON-Q 11GX8 DSP (PAIN MANAGEMENT) ×3 IMPLANT
WATER STERILE IRR 1000ML POUR (IV SOLUTION) ×3 IMPLANT

## 2016-12-12 NOTE — Anesthesia Procedure Notes (Addendum)
Procedure Name: Intubation Date/Time: 12/12/2016 7:43 AM Performed by: Everlean Cherry A Pre-anesthesia Checklist: Patient identified, Emergency Drugs available, Suction available and Patient being monitored Patient Re-evaluated:Patient Re-evaluated prior to inductionOxygen Delivery Method: Circle system utilized Preoxygenation: Pre-oxygenation with 100% oxygen Intubation Type: IV induction Ventilation: Mask ventilation without difficulty and Oral airway inserted - appropriate to patient size Laryngoscope Size: Mac and 3 Grade View: Grade II Tube type: Oral Endobronchial tube: Double lumen EBT, Left, EBT position confirmed by auscultation and EBT position confirmed by fiberoptic bronchoscope and 37 Fr Number of attempts: 1 Airway Equipment and Method: Stylet and Fiberoptic brochoscope Placement Confirmation: ETT inserted through vocal cords under direct vision,  positive ETCO2 and breath sounds checked- equal and bilateral Tube secured with: Tape Dental Injury: Teeth and Oropharynx as per pre-operative assessment

## 2016-12-12 NOTE — Anesthesia Preprocedure Evaluation (Addendum)
Anesthesia Evaluation  Patient identified by MRN, date of birth, ID band Patient awake    Reviewed: Allergy & Precautions, NPO status , Patient's Chart, lab work & pertinent test results  Airway Mallampati: II   Neck ROM: full    Dental  (+) Dental Advidsory Given   Pulmonary COPD, former smoker,  LUL mass   breath sounds clear to auscultation       Cardiovascular negative cardio ROS   Rhythm:regular Rate:Normal     Neuro/Psych PSYCHIATRIC DISORDERS Anxiety Depression    GI/Hepatic   Endo/Other    Renal/GU      Musculoskeletal  (+) Arthritis ,   Abdominal   Peds  Hematology   Anesthesia Other Findings   Reproductive/Obstetrics                            Anesthesia Physical Anesthesia Plan  ASA: III  Anesthesia Plan: General   Post-op Pain Management:    Induction: Intravenous  Airway Management Planned: Double Lumen EBT  Additional Equipment: Arterial line and CVP  Intra-op Plan:   Post-operative Plan: Extubation in OR and Possible Post-op intubation/ventilation  Informed Consent: I have reviewed the patients History and Physical, chart, labs and discussed the procedure including the risks, benefits and alternatives for the proposed anesthesia with the patient or authorized representative who has indicated his/her understanding and acceptance.     Plan Discussed with: CRNA, Anesthesiologist and Surgeon  Anesthesia Plan Comments:         Anesthesia Quick Evaluation

## 2016-12-12 NOTE — Progress Notes (Signed)
Patient ID: [name], male   DOB: 07-Feb-1941, 76 y.o.   MRN: [id number] EVENING ROUNDS NOTE :     [address]       [place] [address]             [phone]                 Day of Surgery Procedure(s) (LRB): VIDEO ASSISTED THORACOSCOPY (VATS)/LEFT UPPER LOBECTOMY (Left)  Total Length of Stay:  LOS: 0 days  BP (!) 87/66   Pulse 95   Temp 97.4 F (36.3 C) (Axillary)   Resp (!) 32   Ht '5\' 7"'$  (1.702 m)   Wt 183 lb (83 kg)   SpO2 92%   BMI 28.66 kg/m   .Intake/Output      04/29 0701 - 04/30 0700 04/30 0701 - 05/01 0700   I.V. (mL/kg)  1500 (18.1)   IV Piggyback  250   Total Intake(mL/kg)  1750 (21.1)   Urine (mL/kg/hr)  520 (0.6)   Blood  350 (0.4)   Chest Tube  340 (0.4)   Total Output   1210   Net   +540          . bupivacaine 0.5 % ON-Q pump SINGLE CATH 400 mL    . bupivacaine ON-Q pain pump    . dextrose 5 % and 0.9 % NaCl with KCl 20 mEq/L 100 mL/hr at 12/12/16 1600  . potassium chloride (KCL MULTIRUN) 30 mEq in 265 mL IVPB    . vancomycin       Lab Results  Component Value Date   WBC 9.7 12/08/2016   HGB 15.1 12/08/2016   HCT 43.9 12/08/2016   PLT 248 12/08/2016   GLUCOSE 92 12/08/2016   ALT 17 12/08/2016   AST 16 12/08/2016   NA 137 12/08/2016   K 4.1 12/08/2016   CL 104 12/08/2016   CREATININE 0.64 12/08/2016   BUN 12 12/08/2016   CO2 25 12/08/2016   INR 1.16 12/08/2016   Patient treated for elevated pco2 after transfer from recovery room, more awake now  After narcan , repeat    abg now 7.24 pco 2 70.3 po2 65  Continue close icu monitoring , repeat abg in one hopur and decrease narcotic for now Small air leak , not bleeding   Grace Isaac MD  Beeper [phone] Office [phone] 12/12/2016 5:36 PM

## 2016-12-12 NOTE — Transfer of Care (Cosign Needed)
Immediate Anesthesia Transfer of Care Note  Patient: [name]  Procedure(s) Performed: Procedure(s): VIDEO ASSISTED THORACOSCOPY (VATS)/LEFT UPPER LOBECTOMY (Left)  Patient Location: PACU  Anesthesia Type:General  Level of Consciousness: awake, alert  and patient cooperative  Airway & Oxygen Therapy: Patient Spontanous Breathing and Patient connected to face mask oxygen  Post-op Assessment: Report given to RN, Post -op Vital signs reviewed and stable and Patient moving all extremities  Post vital signs: Reviewed and stable  Last Vitals:  Vitals:   12/12/16 0556 12/12/16 0608  BP: (!) 137/104 (!) 178/77  Pulse: 75 78  Resp: 18 20  Temp: 36.3 C 36.6 C    Last Pain:  Vitals:   12/12/16 0608  TempSrc: Oral      Patients Stated Pain Goal: 2 (32/02/33 4356)  Complications: No apparent anesthesia complications

## 2016-12-12 NOTE — Progress Notes (Signed)
Pt admitted from PACU s/p L VATS; pt on NRB; sats mid 80's; pt very wet sounding and unable to cough; pt opens eyes to verbal stimulation; pt states name only at this time; pt with garbled speech; ETCO2 60's; Fent PCA in placed, not used at this time; pt unable to follow instructions regarding incentive at this time; pt follows simple commands, moves all extremities; RT in room to NTS and oral suction at this time; small amount of blood tinged sputum obtained; partial NRB placed; sats up to 99-100%; ETCO2 remains 50's; BP stable; pt very lethargic; will cont. To monitor.  Ruben Reason

## 2016-12-12 NOTE — Progress Notes (Signed)
MD paged to  Make aware of current assessment; orders for ABG and Narcan if CO2 >50; will cont. To monitor.  Ruben Reason

## 2016-12-12 NOTE — Progress Notes (Signed)
Pt given Narcan per MD orders; pt awake and talking now; able to state name and birthday; pt states he "hurts bad all over"; order to draw ABG in 20 min; will cont. To monitor.  [name]

## 2016-12-12 NOTE — H&P (View-Only) (Signed)
PCP is Tivis Ringer, MD Referring Provider is Curt Bears, MD  No chief complaint on file.   HPI: [name] is a 76 yo man with a past history of tobacco abuse (1ppd x 60 years), COPD, OSA, and hyperlipidemia. He recently noted an increased cough. He thought it was due to allergies but went to see Dr. Dagmar Hait. A CXR showed a left upper lobe mass. A CT confirmed a 3.4 cm spiculated mass in the LUL. A PET showed the nodule was hypermetabolic but there was no evidence of regional or distant metastasis. A brain MR was negative for metastatic disease as well. He saw Dr. Julien Nordmann who referred him for surgical evaluation.  Walks up 37 steps to get to beach place. Has to stop once to catch his breath. Can walk > 1 mile on level ground without SOB. Denies chest pain , pressure, tightness at rest or with exertion. No change in appetite or weight loss. No HA, change in vision or syncope. Uses O2 at night in lieu of CPAP.  Zubrod Score: At the time of surgery this patient's most appropriate activity status/level should be described as: '[]'$     0    Normal activity, no symptoms '[x]'$     1    Restricted in physical strenuous activity but ambulatory, able to do out light work '[]'$     2    Ambulatory and capable of self care, unable to do work activities, up and about >50 % of waking hours                              '[]'$     3    Only limited self care, in bed greater than 50% of waking hours '[]'$     4    Completely disabled, no self care, confined to bed or chair '[]'$     5    Moribund  Past Medical History:  Diagnosis Date  . COPD (chronic obstructive pulmonary disease) ([place])   . Hyperlipidemia   . Sleep apnea    cannot tolerate cpap    Past Surgical History:  Procedure Laterality Date  . COLONOSCOPY W/ POLYPECTOMY    . cyst back  2011  . CYSTOSCOPY  2012   for bleeding in bladder x 3  . MOUTH SURGERY      Family History  Problem Relation Age of Onset  . Colon cancer Brother   . Emphysema    .  Heart disease    . Cancer    . Heart disease Father   . Heart disease Maternal Uncle   . Heart disease Paternal Uncle     Social History Social History  Substance Use Topics  . Smoking status: Current Every Day Smoker    Packs/day: 0.50    Types: Cigarettes  . Smokeless tobacco: Never Used  . Alcohol use No    Current Outpatient Prescriptions  Medication Sig Dispense Refill  . albuterol (PROVENTIL HFA;VENTOLIN HFA) 108 (90 Base) MCG/ACT inhaler Inhale into the lungs every 6 (six) hours as needed for wheezing or shortness of breath.    Marland Kitchen BREO ELLIPTA 200-25 MCG/INH AEPB Inhale 1 Inhaler into the lungs daily.  2  . budesonide-formoterol (SYMBICORT) 160-4.5 MCG/ACT inhaler Inhale 2 puffs into the lungs 2 (two) times daily.    . montelukast (SINGULAIR) 10 MG tablet Take 10 mg by mouth at bedtime.    . sertraline (ZOLOFT) 50 MG tablet Take 1 tablet by  mouth at bedtime.  5  . simvastatin (ZOCOR) 20 MG tablet Take 20 mg by mouth every evening.    . traZODone (DESYREL) 50 MG tablet Take 50 mg by mouth at bedtime.  2   Current Facility-Administered Medications  Medication Dose Route Frequency Provider Last Rate Last Dose  . 0.9 %  sodium chloride infusion  500 mL Intravenous Continuous Nelida Meuse III, MD        Allergies  Allergen Reactions  . Gadolinium Derivatives Nausea And Vomiting    Pt began immed vomiting post inj of multihance   . Penicillins     "feels like my skin is crawling"    Review of Systems  Constitutional: Negative for activity change, appetite change and unexpected weight change.  HENT: Positive for congestion. Negative for trouble swallowing and voice change.   Eyes: Negative for visual disturbance.  Respiratory: Positive for cough and shortness of breath. Negative for wheezing.   Cardiovascular: Negative for chest pain, palpitations and leg swelling.  Gastrointestinal: Negative for abdominal pain and blood in stool.  Genitourinary: Negative for difficulty  urinating and dysuria.  Musculoskeletal: Negative for arthralgias and myalgias.  Skin:       "thin"  Neurological: Negative for dizziness, seizures, syncope and weakness.  Hematological: Negative for adenopathy. Bruises/bleeds easily.    There were no vitals taken for this visit. Physical Exam  Constitutional: He is oriented to person, place, and time. He appears well-developed and well-nourished. No distress.  HENT:  Head: Normocephalic and atraumatic.  Mouth/Throat: No oropharyngeal exudate.  Eyes: Conjunctivae and EOM are normal. No scleral icterus.  Neck: Neck supple. No thyromegaly present.  Cardiovascular: Normal rate, regular rhythm, normal heart sounds and intact distal pulses.  Exam reveals no gallop and no friction rub.   No murmur heard. Pulmonary/Chest: Effort normal. No respiratory distress. He has no wheezes. He has no rales.  diminished BS bilaterally  Abdominal: Soft. He exhibits no distension. There is no tenderness.  Musculoskeletal: Normal range of motion. He exhibits no edema.  Lymphadenopathy:    He has no cervical adenopathy.  Neurological: He is alert and oriented to person, place, and time. No cranial nerve deficit. Coordination normal.  No motor deficit  Skin: Skin is warm and dry.  Mild clubbing, no cyanosis, multiple ecchymoses  Vitals reviewed.    Diagnostic Tests: CT CHEST WITHOUT CONTRAST  TECHNIQUE: Multidetector CT imaging of the chest was performed following the standard protocol without IV contrast.  COMPARISON:  05/05/2011  FINDINGS: Cardiovascular: Somewhat limited without IV contrast. Aortic calcifications are seen without aneurysmal dilatation. Mild coronary calcifications are noted. No cardiac enlargement is seen.  Mediastinum/Nodes: Thoracic inlet demonstrates enlargement of the right lobe of the thyroid with a partially calcified 2.2 cm nodule identified. No hilar or mediastinal adenopathy is identified. The esophagus as  visualized is within normal limits.  Lungs/Pleura: The lungs are well aerated bilaterally and demonstrate some mild emphysematous changes. In the left upper lobe along the posterior aspect of the major fissure there is a spiculated soft tissue mass lesion consistent with primary pulmonary neoplasm. This measures 3.4 x 3.4 cm secondary to some extension towards the pleura. No other focal abnormality is noted.  Upper Abdomen: No acute abnormality noted.  Musculoskeletal: No acute bony abnormality is seen. Degenerative changes of the thoracic spine are noted.  IMPRESSION: Spiculated soft tissue mass lesion in the left upper lobe as described. PET-CT and tissue sampling are recommended.  These results will be called to the  ordering clinician or representative by the Radiologist Assistant, and communication documented in the PACS or zVision Dashboard.   Electronically Signed   By: Inez Catalina M.D.   On: 11/02/2016 15:27 NUCLEAR MEDICINE PET SKULL BASE TO THIGH  TECHNIQUE: 8.9 mCi F-18 FDG was injected intravenously. Full-ring PET imaging was performed from the skull base to thigh after the radiotracer. CT data was obtained and used for attenuation correction and anatomic localization.  FASTING BLOOD GLUCOSE:  Value: 98 mg/dl  COMPARISON:  Chest CT 11/02/2016  FINDINGS: NECK  No hypermetabolic lymph nodes in the neck. Multinodular right thyroid goiter noted. The  CHEST  The 3.4 x 3.3 cm left upper lobe pulmonary lesion is hypermetabolic with SUV max of 82.50. This is consistent with primary lung neoplasm. No other pulmonary lesions are identified. Mild underlying changes of emphysema are again demonstrated.  Small bilateral lymph nodes have normal morphology. Minimally hypermetabolic with SUV max of 1.8. These are likely inflammatory changes.  No enlarged or hypermetabolic mediastinal or hilar lymph nodes.  ABDOMEN/PELVIS  No abnormal  hypermetabolic activity within the liver, pancreas, adrenal glands, or spleen. No hypermetabolic lymph nodes in the abdomen or pelvis.  Additional findings include advanced atherosclerotic calcifications involving the distal aorta and iliac arteries but no aneurysm. Moderate descending and sigmoid diverticulosis without findings for diverticulitis.  6 mm calculus in the right side of the bladder near the right UVJ but no right-sided hydroureteronephrosis.  Moderate prostate gland enlargement and prostate calcifications.  Small bilateral inguinal hernias containing fat. There are also bilateral varicoceles.  SKELETON  No focal hypermetabolic activity to suggest skeletal metastasis.  IMPRESSION: 1. 3.4 x 3.3 cm left upper lobe pulmonary lesion is hypermetabolic and consistent with primary lung neoplasm. 2. No mediastinal or hilar lymphadenopathy or findings for metastatic disease. 3. Incidental findings as discussed above.   Electronically Signed   By: Marijo Sanes M.D.   On: 11/24/2016 15:45 I personally reviewed the CT and PET CT and concur with the findings noted above  PULMONARY FUNCTION TESTS FVC= 1.77 (50%) FEV1= 0.89(36%)/ 1.01 (41%) DLCO= 11.22(41%)  Impression: 76 yo man with a long history of tobacco abuse who has a 3.4 cm spiculated mass in the left upper lobe that is strongly hypermetabolic on PET. This is almost certainly a new primary bronchogenic carcinoma. I reviewed the images with Mr and Mrs Genova and reviewed the differential diagnosis. We discussed various ways to establish a diagnosis and treat the mass.  We discussed treating with surgery v radiation. His PFTs are marginal for lobectomy but his functional status is better than the numbers and he should be able to tolerate a segmentectomy. He understands radiation has less morbidity up front but a lower chance of cure. He favors surgery.  I recommended we proceed with left VATS for a lingular  sparing left upper lobectomy for diagnostic and therapeutic purposes.  I discussed the general nature of the procedure, the need for general anesthesia, and the incisions to be used with the Mr and Mrs Siddoway. We discussed the expected hospital stay, overall recovery and short and long term outcomes. I informed them of the indications, risks, benefits and alternatives. They understand the risks include, but are not limited to death, stroke, MI, DVT/PE, bleeding, possible need for transfusion, infections, prolonged air leaks, cardiac arrhythmias, as well as other organ system dysfunction including respiratory, renal, or GI complications.   He accepts the risks and agrees to proceed.  Plan: Left VATS for lingular sparing left  upper lobectomy My office will call him to schedule him the week of 12/12/16  [name], MD [facility] [phone]

## 2016-12-12 NOTE — Interval H&P Note (Signed)
History and Physical Interval Note:  12/12/2016 7:22 AM  [name]  has presented today for surgery, with the diagnosis of LUL MASS  The various methods of treatment have been discussed with the patient and family. After consideration of risks, benefits and other options for treatment, the patient has consented to  Procedure(s): VIDEO ASSISTED THORACOSCOPY (VATS)/LEFT UPPER LOBECTOMY (Left) as a surgical intervention .  The patient's history has been reviewed, patient examined, no change in status, stable for surgery.  I have reviewed the patient's chart and labs.  Questions were answered to the patient's satisfaction.     [name]

## 2016-12-12 NOTE — Brief Op Note (Addendum)
12/12/2016  12:25 PM  PATIENT:  [name]  76 y.o. 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  PROCEDURE:  LEFT VIDEO ASSISTED THORACOSCOPY (VATS),  THORACOSCOPIC LINGULA SPARING LEFT UPPER LOBECTOMY,  LYMPH NODE DISSECTION, and  ON Q PLACEMENT  SURGEON:  Surgeon(s) and Role:    * [name], MD - Primary  PHYSICIAN ASSISTANT: Lars Pinks PA-C  ANESTHESIA:   general  EBL:  Total I/O In: 6286 [I.V.:1500; IV Piggyback:250] Out: 14 [Urine:290; Blood:350]  BLOOD ADMINISTERED:none  DRAINS: A 28 French chest tube and Blake drain placed in the left pleural spaces   LOCAL MEDICATIONS USED:  BUPIVICAINE   SPECIMEN:  Source of Specimen:  Lingula sparing LUL, lymph nodes  DISPOSITION OF SPECIMEN:  Pathology. Frozen section showed margin negative for cancer and LUL mass was NSCC  COUNTS CORRECT:  YES  PLAN OF CARE: Admit to inpatient   PATIENT DISPOSITION:  PACU - hemodynamically stable.   Delay start of Pharmacological VTE agent (>24hrs) due to surgical blood loss or risk of bleeding: yes

## 2016-12-13 ENCOUNTER — Encounter (HOSPITAL_COMMUNITY): Payer: Self-pay | Admitting: Thoracic Surgery (Cardiothoracic Vascular Surgery)

## 2016-12-13 ENCOUNTER — Inpatient Hospital Stay (HOSPITAL_COMMUNITY): Payer: Medicare Other

## 2016-12-13 LAB — CBC
HEMATOCRIT: 37.5 % — AB (ref 39.0–52.0)
Hemoglobin: 12.3 g/dL — ABNORMAL LOW (ref 13.0–17.0)
MCH: 31.2 pg (ref 26.0–34.0)
MCHC: 32.8 g/dL (ref 30.0–36.0)
MCV: 95.2 fL (ref 78.0–100.0)
Platelets: 245 10*3/uL (ref 150–400)
RBC: 3.94 MIL/uL — ABNORMAL LOW (ref 4.22–5.81)
RDW: 13.9 % (ref 11.5–15.5)
WBC: 16.9 10*3/uL — ABNORMAL HIGH (ref 4.0–10.5)

## 2016-12-13 LAB — GLUCOSE, CAPILLARY
GLUCOSE-CAPILLARY: 130 mg/dL — AB (ref 65–99)
GLUCOSE-CAPILLARY: 128 mg/dL — AB (ref 65–99)
GLUCOSE-CAPILLARY: 157 mg/dL — AB (ref 65–99)
Glucose-Capillary: 111 mg/dL — ABNORMAL HIGH (ref 65–99)
Glucose-Capillary: 121 mg/dL — ABNORMAL HIGH (ref 65–99)

## 2016-12-13 LAB — BLOOD GAS, ARTERIAL
Acid-Base Excess: 3.3 mmol/L — ABNORMAL HIGH (ref 0.0–2.0)
BICARBONATE: 28.9 mmol/L — AB (ref 20.0–28.0)
Drawn by: 419771
O2 Content: 6 L/min
O2 Saturation: 97.9 %
PH ART: 7.317 — AB (ref 7.350–7.450)
PO2 ART: 108 mmHg (ref 83.0–108.0)
Patient temperature: 99
pCO2 arterial: 58.3 mmHg — ABNORMAL HIGH (ref 32.0–48.0)

## 2016-12-13 LAB — POCT I-STAT 3, ART BLOOD GAS (G3+)
Acid-Base Excess: 1 mmol/L (ref 0.0–2.0)
Bicarbonate: 29.8 mmol/L — ABNORMAL HIGH (ref 20.0–28.0)
O2 Saturation: 97 %
PCO2 ART: 65.2 mmHg — AB (ref 32.0–48.0)
PH ART: 7.267 — AB (ref 7.350–7.450)
Patient temperature: 98.4
TCO2: 32 mmol/L (ref 0–100)
pO2, Arterial: 101 mmHg (ref 83.0–108.0)

## 2016-12-13 LAB — BASIC METABOLIC PANEL
Anion gap: 5 (ref 5–15)
BUN: 19 mg/dL (ref 6–20)
CHLORIDE: 103 mmol/L (ref 101–111)
CO2: 27 mmol/L (ref 22–32)
Calcium: 8.1 mg/dL — ABNORMAL LOW (ref 8.9–10.3)
Creatinine, Ser: 0.75 mg/dL (ref 0.61–1.24)
GFR calc Af Amer: >60 mL/min (ref >60)
GFR calc non Af Amer: >60 mL/min (ref >60)
GLUCOSE: 135 mg/dL — AB (ref 65–99)
POTASSIUM: 4.8 mmol/L (ref 3.5–5.1)
Sodium: 135 mmol/L (ref 135–145)

## 2016-12-13 MED ORDER — METHYLPREDNISOLONE SODIUM SUCC 40 MG IJ SOLR
40.0000 mg | Freq: Once | INTRAMUSCULAR | Status: AC
  Administered 2016-12-13: 40 mg via INTRAVENOUS
  Filled 2016-12-13: qty 1

## 2016-12-13 MED ORDER — ENOXAPARIN SODIUM 40 MG/0.4ML ~~LOC~~ SOLN
40.0000 mg | SUBCUTANEOUS | Status: DC
  Administered 2016-12-13 – 2016-12-17 (×5): 40 mg via SUBCUTANEOUS
  Filled 2016-12-13 (×5): qty 0.4

## 2016-12-13 MED ORDER — LEVALBUTEROL HCL 0.63 MG/3ML IN NEBU
0.6300 mg | INHALATION_SOLUTION | Freq: Four times a day (QID) | RESPIRATORY_TRACT | Status: DC
  Administered 2016-12-13 – 2016-12-14 (×5): 0.63 mg via RESPIRATORY_TRACT
  Filled 2016-12-13 (×5): qty 3

## 2016-12-13 MED ORDER — INSULIN ASPART 100 UNIT/ML ~~LOC~~ SOLN
0.0000 [IU] | Freq: Three times a day (TID) | SUBCUTANEOUS | Status: DC
Start: 2016-12-13 — End: 2016-12-17
  Administered 2016-12-13 – 2016-12-14 (×2): 2 [IU] via SUBCUTANEOUS

## 2016-12-13 NOTE — Care Management Note (Signed)
Case Management Note Marvetta Gibbons RN, BSN Unit 2W-Case Manager-- [place] coverage [phone]  Patient Details  Name: GARIN MATA MRN: [id number] Date of Birth: [birth year]  Subjective/Objective:    Pt admitted s/p VAT with left upper lobectomy on 12/12/16                Action/Plan: PTA pt lived at home with wife- anticipate return home- CM to follow for potential d/c needs  Expected Discharge Date:                  Expected Discharge Plan:   Home  In-House Referral:     Discharge planning Services  CM Consult  Post Acute Care Choice:    Choice offered to:     DME Arranged:    DME Agency:     HH Arranged:    [place] Agency:     Status of Service:  In process, will continue to follow  If discussed at Long Length of Stay Meetings, dates discussed:    Discharge Disposition:   Additional Comments:  Dawayne Patricia, RN 12/13/2016, 10:26 AM

## 2016-12-13 NOTE — Op Note (Signed)
[name], [name] NO.:  [id number]  MEDICAL RECORD NO.:  [id number]  LOCATION:                                 FACILITY:  PHYSICIAN:  Revonda Standard. Roxan Hockey, M.D. DATE OF BIRTH:  DATE OF PROCEDURE:  12/12/2016 DATE OF DISCHARGE:                              OPERATIVE REPORT   PREOPERATIVE DIAGNOSIS:  Left upper lobe mass.  POSTOPERATIVE DIAGNOSIS:  Non-small cell carcinoma left upper lobe, clinical stage IB.  PROCEDURE:   Left video-assisted thoracoscopy, Thoracoscopic lingular sparing left upper lobectomy, Mediastinal lymph node dissection,  ON-Q local anesthetic catheter placement.  SURGEON:  Revonda Standard. Roxan Hockey, M.D.  ASSISTANT:  Lars Pinks, PA.  ANESTHESIA:  General.  FINDINGS:  A 3.5 cm mass in posterior left upper lobe. Enlarged, but otherwise benign-appearing lymph nodes.  Frozen section revealed non- small cell carcinoma. Margin free of tumor.  CLINICAL NOTE:  [name] is a 76 year old gentleman with a history of tobacco abuse who recently noticed an increased cough.  The chest x-ray showed a left upper lobe mass, which was confirmed by CT.  A PET showed the nodule was hypermetabolic with no evidence of regional or distant metastases.  Brain MR showed no evidence of metastatic disease.  Despite having significant COPD, the patient is functionally very active.  He was offered surgery versus radiation as initial therapy.  The relative advantages and disadvantages of each approach were discussed in detail with him.  He strongly preferred surgical resection.  OPERATIVE NOTE:  [name] was brought to the preoperative holding area on December 12, 2016.  Anesthesia placed a central line and an arterial blood pressure monitoring line.  He was taken to the operating room, anesthetized, and intubated with a double-lumen endotracheal tube. Intravenous antibiotics were administered.  A Foley catheter was placed. Sequential compression  devices were placed on the calves for DVT prophylaxis.  He was placed in a right lateral decubitus position and the left chest was prepped and draped in usual sterile fashion.  Single lung ventilation of the right lung was initiated and it was tolerated well throughout the procedure.  An incision was made in the seventh intercostal space in the midaxillary line. A 5 mm port was inserted into the chest.  The thoracoscope was advanced into the chest.  There was good isolation of the left lung with no cross ventilation, but there was relatively slow deflation due to air trapping.  Suction was applied through the endotracheal tube.  A 5-cm working incision was made in the fourth interspace anterolaterally.  No rib spreading was performed during the procedure.  The mass was identified in the posterior aspect of the left upper lobe. There was no involvement of the parietal pleura.  There was some invagination of the visceral pleura.  The dissection was begun in the fissure, which was relatively complete in its midportion with just pleura overlying the pulmonary artery.  Dissection was carried out along the pulmonary artery.  All lymph nodes that were encountered during the dissection were removed and sent as separate specimens for permanent pathology.  By this point, the lower lobe had deflated and the inferior ligament was divided.  The pleural reflection was divided at the hilum anteriorly and posteriorly.  There was a small portion of the pleural reflection that could not be divided superiorly and posteriorly at the top of the hilum, this was divided later.  Due to the patient's compromised pulmonary function, the plan was to perform a lingular sparing left upper lobectomy to maximize residual pulmonary function. The nodule was too big for a wedge resection to be a definitive treatment.  Relatively large level 10 nodes were encountered at the hilum anteriorly, these were removed and sent  as permanent specimens.  The superior pulmonary vein was identified and the lingular branches were identified and preserved.  The remaining branches were encircled after dissecting off the surrounding lymph nodes.  Once a clamp was passed safely around the veins, a suture was passed and with traction, the endoscopic stapler was used to divide the vein.  A second incision was made anterior to the first port incision for placement of the stapler.   This allowed exposure of the most anterior branch of the pulmonary artery.  This was relatively slow and tedious dissection. The anterior branch was large branch.  Once surrounding nodes had been removed, a clamp was gently passed around the branch and then the stapler was placed across the pulmonary artery branch, closed, and fired dividing the pulmonary artery branch.  Next, the dissection was carried back into the fissure.  The remainder of the major fissure between the upper and lower lobes was completed posteriorly with an endoscopic GIA stapler with 60 mm gold cartridges.  The posterior upper lobe pulmonary artery branch was identified, encircled, and divided with the endoscopic stapler.  Finally, the third upper lobe branch was isolated and divided with the endoscopic stapler as well.  The parenchymal division for the segmentectomy was completed with sequential firings of the Echelon stapler using the 60 mm green and black cartridges.  To complete the final portion of the fissure, a third port type incision was made posteriorly to allow good angulation to ensure maximal margin on the tumor.  Specimen was placed into an endoscopic retrieval bag removed and sent for frozen section of the mass and bronchial margin.  After completing the segmentectomy, it was obvious there was a small portion of the left upper lobe still attached to the lingular segment that did not inflate or appear to be perfused with the test inflation.  There was good  aeration of the remainder of the lingula.  This remaining portion of the posterior segment was removed with the Echelon stapler using a gold cartridge.  It was sent as the final margin.  Additional lymph nodes were removed.  The chest was copiously irrigated with warm saline.  A test inflation to 30 cm of water revealed minimal parenchymal leak but no leakage from the bronchus.  An On-Q local anesthetic catheter was placed through a stab incision posteriorly and tunneled into a subpleural location, it was primed with 5 mL of 0.5% Marcaine.  A 28-French chest tube was placed through the anterior port incision.  The middle port incision was used to place a 28-French Blake drain, which was directed posteriorly, both were secured with #1 silk sutures.  The remaining port incision was closed with a 4-0 Vicryl subcuticular suture.  The lower lobe and lingula were reinflated.  The working incision was closed with #1 Vicryl fascial suture.  The subcutaneous tissue and skin were closed in standard fashion.  The chest tubes were placed to suction.  The  patient was placed back in a supine position. He was then extubated in the operating room and taken to the postanesthetic care unit in good condition.     Revonda Standard Roxan Hockey, M.D.     SCH/MEDQ  D:  12/12/2016  T:  12/13/2016  Job:  [id number]

## 2016-12-13 NOTE — Progress Notes (Addendum)
CRITICAL VALUE ALERT  Critical value received:  PCO2 65.2  Date of notification:  12/13/16  Time of notification:  2712  Critical value read back:Yes  Nurse who received alert:  Philis Fendt, RN  MD notified (1st page):  Dr Roxan Hockey  Time of first page:  [phone]  MD notified (2nd page):  Time of second page:  Responding MD:  Dr Roxan Hockey  Time MD responded:  [phone]

## 2016-12-13 NOTE — Progress Notes (Signed)
CT surgery p.m. Rounds  Patient sitting in bed eating dinner with wife Ambulated in the hallway No air leak from chest tubes this p.m. Blood gas this afternoon with PCO2 > 60 but mentating well with compensated pH Continue current care

## 2016-12-13 NOTE — Anesthesia Postprocedure Evaluation (Signed)
Anesthesia Post Note  Patient: [name]  Procedure(s) Performed: Procedure(s) (LRB): VIDEO ASSISTED THORACOSCOPY (VATS)/LEFT UPPER LOBECTOMY (Left)  Patient location during evaluation: PACU Anesthesia Type: General Level of consciousness: awake and alert and patient cooperative Pain management: pain level controlled Vital Signs Assessment: post-procedure vital signs reviewed and stable Respiratory status: spontaneous breathing and respiratory function stable Cardiovascular status: stable Anesthetic complications: no       Last Vitals:  Vitals:   12/13/16 0744 12/13/16 0748  BP: 106/84 106/84  Pulse:  86  Resp:  (!) 23  Temp:      Last Pain:  Vitals:   12/13/16 0743  TempSrc: Oral  PainSc:     LLE Motor Response: Purposeful movement;Responds to commands (12/13/16 0747)   RLE Motor Response: Purposeful movement;Responds to commands (12/13/16 0747)        [place]

## 2016-12-13 NOTE — Progress Notes (Signed)
1 Day Post-Op Procedure(s) (LRB): VIDEO ASSISTED THORACOSCOPY (VATS)/LEFT UPPER LOBECTOMY (Left) Subjective: c/o incisional pain  Objective: Vital signs in last 24 hours: Temp:  [97.4 F (36.3 C)-99 F (37.2 C)] 97.7 F (36.5 C) (05/01 0743) Pulse Rate:  [66-96] 86 (05/01 0748) Cardiac Rhythm: Normal sinus rhythm (05/01 0747) Resp:  [15-36] 23 (05/01 0748) BP: (83-124)/(44-84) 106/84 (05/01 0748) SpO2:  [87 %-100 %] 95 % (05/01 0748) Arterial Line BP: (81-166)/(37-84) 98/46 (05/01 0700) Weight:  [182 lb 8.7 oz (82.8 kg)] 182 lb 8.7 oz (82.8 kg) (05/01 0700)  Hemodynamic parameters for last 24 hours:    Intake/Output from previous day: 04/30 0701 - 05/01 0700 In: 3450 [I.V.:3000; IV Piggyback:450] Out: 2245 [Urine:995; Blood:350; Chest Tube:900] Intake/Output this shift: Total I/O In: -  Out: 100 [Chest Tube:100]  General appearance: alert, cooperative and no distress Neurologic: intact Heart: regular rate and rhythm Lungs: diminished breath sounds bibasilar and R>L! and wheezes bilaterally Abdomen: mildly distended, nontender + air leak, serosanguinous drainage  Lab Results:  Recent Labs  12/13/16 0345  WBC 16.9*  HGB 12.3*  HCT 37.5*  PLT 245   BMET:  Recent Labs  12/13/16 0345  NA 135  K 4.8  CL 103  CO2 27  GLUCOSE 135*  BUN 19  CREATININE 0.75  CALCIUM 8.1*    PT/INR: No results for input(s): LABPROT, INR in the last 72 hours. ABG    Component Value Date/Time   PHART 7.317 (L) 12/13/2016 0355   HCO3 28.9 (H) 12/13/2016 0355   TCO2 32 12/12/2016 1824   O2SAT 97.9 12/13/2016 0355   CBG (last 3)   Recent Labs  12/12/16 1306 12/12/16 1556 12/13/16 0509  GLUCAP 151* 139* 130*    Assessment/Plan: S/P Procedure(s) (LRB): VIDEO ASSISTED THORACOSCOPY (VATS)/LEFT UPPER LOBECTOMY (Left) -Looks better this AM Pain reasonably well controlled Hypercarbia/ respiratory acidosis still present but trending down- continue IS,  bronchodilators Atelectasis- add flutter valve SCD + enoxaparin for DVT prophylaxis Mobilize as tolerated   LOS: 1 day    [name] 12/13/2016

## 2016-12-14 ENCOUNTER — Inpatient Hospital Stay (HOSPITAL_COMMUNITY): Payer: Medicare Other

## 2016-12-14 LAB — GLUCOSE, CAPILLARY
GLUCOSE-CAPILLARY: 118 mg/dL — AB (ref 65–99)
GLUCOSE-CAPILLARY: 127 mg/dL — AB (ref 65–99)
Glucose-Capillary: 104 mg/dL — ABNORMAL HIGH (ref 65–99)
Glucose-Capillary: 148 mg/dL — ABNORMAL HIGH (ref 65–99)
Glucose-Capillary: 103 mg/dL — ABNORMAL HIGH (ref 65–99)

## 2016-12-14 LAB — POCT I-STAT 3, ART BLOOD GAS (G3+)
Acid-Base Excess: 5 mmol/L — ABNORMAL HIGH (ref 0.0–2.0)
BICARBONATE: 33.1 mmol/L — AB (ref 20.0–28.0)
O2 Saturation: 95 %
PH ART: 7.335 — AB (ref 7.350–7.450)
PO2 ART: 80 mmHg — AB (ref 83.0–108.0)
Patient temperature: 97.5
TCO2: 35 mmol/L (ref 0–100)
pCO2 arterial: 61.7 mmHg — ABNORMAL HIGH (ref 32.0–48.0)

## 2016-12-14 LAB — TYPE AND SCREEN
ABO/RH(D): A NEG
ANTIBODY SCREEN: NEGATIVE
Unit division: 0
Unit division: 0

## 2016-12-14 LAB — BPAM RBC
Blood Product Expiration Date: 201805142359
Blood Product Expiration Date: 201805162359
ISSUE DATE / TIME: 201804300733
ISSUE DATE / TIME: 201804300733
UNIT TYPE AND RH: 600
Unit Type and Rh: 600

## 2016-12-14 LAB — CBC
HCT: 37.3 % — ABNORMAL LOW (ref 39.0–52.0)
HEMOGLOBIN: 12.3 g/dL — AB (ref 13.0–17.0)
MCH: 31.9 pg (ref 26.0–34.0)
MCHC: 33 g/dL (ref 30.0–36.0)
MCV: 96.6 fL (ref 78.0–100.0)
PLATELETS: 223 10*3/uL (ref 150–400)
RBC: 3.86 MIL/uL — AB (ref 4.22–5.81)
RDW: 14.2 % (ref 11.5–15.5)
WBC: 15.8 10*3/uL — AB (ref 4.0–10.5)

## 2016-12-14 LAB — COMPREHENSIVE METABOLIC PANEL
ALK PHOS: 48 U/L (ref 38–126)
ALT: 15 U/L — AB (ref 17–63)
ANION GAP: 5 (ref 5–15)
AST: 26 U/L (ref 15–41)
Albumin: 2.6 g/dL — ABNORMAL LOW (ref 3.5–5.0)
BUN: 13 mg/dL (ref 6–20)
CHLORIDE: 104 mmol/L (ref 101–111)
CO2: 27 mmol/L (ref 22–32)
Calcium: 8.6 mg/dL — ABNORMAL LOW (ref 8.9–10.3)
Creatinine, Ser: 0.67 mg/dL (ref 0.61–1.24)
GFR calc non Af Amer: >60 mL/min (ref >60)
Glucose, Bld: 147 mg/dL — ABNORMAL HIGH (ref 65–99)
POTASSIUM: 5.1 mmol/L (ref 3.5–5.1)
Sodium: 136 mmol/L (ref 135–145)
TOTAL PROTEIN: 5.3 g/dL — AB (ref 6.5–8.1)
Total Bilirubin: 0.8 mg/dL (ref 0.3–1.2)

## 2016-12-14 MED ORDER — LEVALBUTEROL HCL 0.63 MG/3ML IN NEBU: 0.6300 mg | INHALATION_SOLUTION | Freq: Four times a day (QID) | RESPIRATORY_TRACT | Status: DC | PRN

## 2016-12-14 MED ORDER — SODIUM CHLORIDE 0.9 % IV SOLN: INTRAVENOUS | Status: DC

## 2016-12-14 NOTE — Progress Notes (Signed)
83m fentanyl syringe wasted witnessed by MSiri ColeRN

## 2016-12-14 NOTE — Plan of Care (Signed)
Problem: Activity: Goal: Risk for activity intolerance will decrease Outcome: Progressing Ambulate in hall 3x today

## 2016-12-14 NOTE — Progress Notes (Signed)
Fentanyl syringe received fom Nikki Dom.

## 2016-12-14 NOTE — Progress Notes (Addendum)
2 Days Post-Op Procedure(s) (LRB): VIDEO ASSISTED THORACOSCOPY (VATS)/LEFT UPPER LOBECTOMY (Left) Subjective: Up in chair. Pain well controlled. Denies nausea but not hungry  Objective: Vital signs in last 24 hours: Temp:  [97.5 F (36.4 C)-98.4 F (36.9 C)] 97.5 F (36.4 C) (05/02 0300) Pulse Rate:  [55-103] 55 (05/02 0700) Cardiac Rhythm: Normal sinus rhythm (05/02 0600) Resp:  [14-32] 14 (05/02 0700) BP: (84-153)/(52-84) 109/62 (05/02 0700) SpO2:  [76 %-99 %] 96 % (05/02 0700) Arterial Line BP: (114-163)/(49-72) 158/59 (05/02 0400) Weight:  [179 lb 12.8 oz (81.6 kg)-183 lb 6.8 oz (83.2 kg)] 179 lb 12.8 oz (81.6 kg) (05/02 0600)  Hemodynamic parameters for last 24 hours:    Intake/Output from previous day: 05/01 0701 - 05/02 0700 In: 2208.3 [P.O.:960; I.V.:1248.3] Out: 4240 [Urine:3610; Chest Tube:630] Intake/Output this shift: No intake/output data recorded.  General appearance: alert, cooperative and no distress Neurologic: intact Heart: regular rate and rhythm Lungs: diminished breath sounds bibasilar and no wheezing Abdomen: mildly distended, nontender, + BS no air leak, serous drainage from CT  Lab Results:  Recent Labs  12/13/16 0345 12/14/16 0400  WBC 16.9* 15.8*  HGB 12.3* 12.3*  HCT 37.5* 37.3*  PLT 245 223   BMET:  Recent Labs  12/13/16 0345 12/14/16 0400  NA 135 136  K 4.8 5.1  CL 103 104  CO2 27 27  GLUCOSE 135* 147*  BUN 19 13  CREATININE 0.75 0.67  CALCIUM 8.1* 8.6*    PT/INR: No results for input(s): LABPROT, INR in the last 72 hours. ABG    Component Value Date/Time   PHART 7.335 (L) 12/14/2016 0420   HCO3 33.1 (H) 12/14/2016 0420   TCO2 35 12/14/2016 0420   O2SAT 95.0 12/14/2016 0420   CBG (last 3)   Recent Labs  12/13/16 1156 12/13/16 1804 12/13/16 2159  GLUCAP 121* 128* 157*    Assessment/Plan: S/P Procedure(s) (LRB): VIDEO ASSISTED THORACOSCOPY (VATS)/LEFT UPPER LOBECTOMY (Left) -Looks better this AM  Still has  CO2 retention although pH normalizing. He is not wheezing this AM and there may be a component of suppression of hypoxic respiratory drive. Will wean O2 keeping sats > 90% Continue IS and flutter valve Continue bronchodilators No air leak this AM- CT to water seal Increase ambulation CBG mildly elevated- dc D5, continue SSI SCD + enoxaparin for DVT prophylaxis Path T2N0- stage IB- patient informed On levaquin for + preop UTI  LOS: 2 days    [name] 12/14/2016

## 2016-12-14 NOTE — Progress Notes (Signed)
[facility] BRIEF SICU PROGRESS NOTE  2 Days Post-Op  S/P Procedure(s) (LRB): VIDEO ASSISTED THORACOSCOPY (VATS)/LEFT UPPER LOBECTOMY (Left)   Stable day  Plan: Continue current plan  Rexene Alberts, MD 12/14/2016 5:50 PM

## 2016-12-14 NOTE — Progress Notes (Signed)
Fentanyl syringe for PCA given to Clyda Hurdle, RN

## 2016-12-15 ENCOUNTER — Inpatient Hospital Stay (HOSPITAL_COMMUNITY): Payer: Medicare Other

## 2016-12-15 LAB — GLUCOSE, CAPILLARY
GLUCOSE-CAPILLARY: 120 mg/dL — AB (ref 65–99)
GLUCOSE-CAPILLARY: 99 mg/dL (ref 65–99)
Glucose-Capillary: 99 mg/dL (ref 65–99)
Glucose-Capillary: 119 mg/dL — ABNORMAL HIGH (ref 65–99)
Glucose-Capillary: 129 mg/dL — ABNORMAL HIGH (ref 65–99)

## 2016-12-15 NOTE — Progress Notes (Signed)
Witnessed 73m fentanyl syringe wasted in [facility] with SClyda Hurdle RN.  MSiri Cole RN

## 2016-12-15 NOTE — Discharge Summary (Signed)
Physician Discharge Summary       [address]       [place] [address]             [phone]    Patient ID: [name] MRN: [id number] DOB/AGE: 01/20/1941 76 y.o.  Admit date: 12/12/2016 Discharge date: 12/17/2016  Admission Diagnoses: Left upper lobe mass  Active Diagnoses:  1.Squamous cell carcinoma LUL 2. COPD (chronic obstructive pulmonary disease) ([place]) 3. Hyperlipidemia 4. Obstructive sleep apnea 5. Tobacco abuse  Procedure (s):  Left video-assisted thoracoscopy, thoracoscopic lingular sparing left upper lobectomy, mediastinal lymph node dissection, ON-Q local anesthetic catheter placement by Dr. Roxan Hockey on 12/12/2016.  Pathology: FINAL DIAGNOSIS Diagnosis 1. Lung, resection (segmental or lobe), LUL - INVASIVE SQUAMOUS CELL CARCINOMA, POORLY DIFFERENTIATED, SPANNING 3.2 CM. - THE SURGICAL RESECTION MARGINS ARE NEGATIVE FOR CARCINOMA. - SEE ONCOLOGY TABLE BELOW. 2. Lymph node, biopsy, 10 L - THERE IS NO EVIDENCE OF CARCINOMA IN 1 OF 1 LYMPH NODE (0/1). 3. Lymph node, biopsy, 11 L - BENIGN LUNG PARENCHYMA. - LYMPH NODAL TISSUE IS NOT IDENTIFIED. - THERE IS NO EVIDENCE OF MALIGNANCY. 4. Lymph node, biopsy, 5 - THERE IS NO EVIDENCE OF CARCINOMA IN 1 OF 1 LYMPH NODE (0/1). 5. Lymph node, biopsy, 7 - BENIGN LUNG PARENCHYMA. - LYMPH NODAL TISSUE IS NOT IDENTIFIED. - THERE IS NO EVIDENCE OF MALIGNANCY. 6. Lymph node, biopsy, 7 #2 - BENIGN LUNG PARENCHYMA. - LYMPH NODAL TISSUE IS NOT IDENTIFIED. - THERE IS NO EVIDENCE OF MALIGNANCY. 7. Lymph node, biopsy, 9 L - THERE IS NO EVIDENCE OF CARCINOMA IN 1 OF 1 LYMPH NODE (0/1). 8. Lymph node, biopsy, 12 L - THERE IS NO EVIDENCE OF CARCINOMA IN 1 OF 1 LYMPH NODE (0/1). 9. Lung, wedge biopsy/resection, Left upper - BENIGN LUNG PARENCHYMA. - THERE IS NO EVIDENCE OF MALIGNANCY. TNM code: pT2, pN0  History of Presenting Illness: This is a 76 yo man with a past history of tobacco abuse (1ppd x 60  years), COPD, OSA, and hyperlipidemia. He recently noted an increased cough. He thought it was due to allergies but went to see Dr. Dagmar Hait. A CXR showed a left upper lobe mass. A CT confirmed a 3.4 cm spiculated mass in the LUL. A PET showed the nodule was hypermetabolic but there was no evidence of regional or distant metastasis. A brain MR was negative for metastatic disease as well. He saw Dr. Julien Nordmann who referred him for surgical evaluation.  Walks up 37 steps to get to beach place. Has to stop once to catch his breath. Can walk > 1 mile on level ground without SOB. Denies chest pain , pressure, tightness at rest or with exertion. No change in appetite or weight loss. No HA, change in vision or syncope. Uses O2 at night in lieu of CPAP.  Dr. Roxan Hockey recommended we proceed with left VATS for a lingular sparing left upper lobectomy for diagnostic and therapeutic purposes. Potential risks, benefits, and complications of the surgery were discussed with the patient and he was admitted on 12/12/2016 to undergo the aforementioned surgery.  Brief Hospital Course:  The patient remained afebrile and hemodynamically stable. A line and foley were removed early in the post operative course. Chest tube output gradually decreased. Daily chest x rays were obtained and remained stable. All chest tubes were removed by 12/17/2015. PCA was also stopped on 05/04. We are trying to wean him to room air.  Patient is tolerating a diet and has had a bowel  movement. Wounds are clean and dry. Final chest X ray showed small fluid level in the anterior apical lung on the lateral view, possible tiny hydopneumothorax at the left upper lobectomy site and mild patchy bibasilar lung opacities, stable. Patient is felt surgically stable for discharge today.   Latest Vital Signs: Blood pressure (!) 105/54, pulse 74, temperature 98.4 F (36.9 C), temperature source Oral, resp. rate (!) 21, height '5\' 7"'$  (1.702 m), weight 81.6 kg (179 lb  12.8 oz), SpO2 96 %.  Physical Exam: General appearance: alert, cooperative and no distress Neurologic: intact Heart: regular rate and rhythm Lungs: diminished breath sounds bilaterally Abdomen: normal findings: soft, non-tender Wound: clean and dry  Discharge Condition: Stable and discharged to home.  Recent laboratory studies:  Lab Results  Component Value Date   WBC 11.4 (H) 12/16/2016   HGB 11.8 (L) 12/16/2016   HCT 36.7 (L) 12/16/2016   MCV 95.3 12/16/2016   PLT 285 12/16/2016   Lab Results  Component Value Date   NA 137 12/16/2016   K 3.9 12/16/2016   CL 98 (L) 12/16/2016   CO2 33 (H) 12/16/2016   CREATININE 0.75 12/16/2016   GLUCOSE 104 (H) 12/16/2016    Diagnostic Studies:  Mr Jeri Cos JJ Contrast  Result Date: 11/22/2016 CLINICAL DATA:  Lung cancer, evaluate for brain metastasis. Initial staging. EXAM: MRI HEAD WITHOUT AND WITH CONTRAST TECHNIQUE: Multiplanar, multiecho pulse sequences of the brain and surrounding structures were obtained without and with intravenous contrast. CONTRAST:  52m MULTIHANCE GADOBENATE DIMEGLUMINE 529 MG/ML IV SOLN COMPARISON:  None. FINDINGS: INTRACRANIAL CONTENTS: No reduced diffusion to suggest acute ischemia or hypercellular tumor. Chronic RIGHT cerebellar microhemorrhage. The ventricles and sulci are normal for patient's age. Patchy supratentorial pontine white matter FLAIR T2 hyperintensities. No suspicious parenchymal signal, masses, mass effect. No abnormal intraparenchymal or extra-axial enhancement. No abnormal extra-axial fluid collections. No extra-axial masses. VASCULAR: Normal major intracranial vascular flow voids present at skull base. SKULL AND UPPER CERVICAL SPINE: No abnormal sellar expansion. No suspicious calvarial bone marrow signal. Craniocervical junction maintained. SINUSES/ORBITS: Trace paranasal sinus mucosal thickening. The included ocular globes and orbital contents are non-suspicious. OTHER: None. IMPRESSION: No MR  findings of intracranial metastasis. Mild to moderate chronic small vessel ischemic disease. Electronically Signed   By: CElon AlasM.D.   On: 11/22/2016 20:18   Nm Pet Image Initial (pi) Skull Base To Thigh  Result Date: 11/24/2016 CLINICAL DATA:  Initial treatment strategy for left upper lobe lung mass. EXAM: NUCLEAR MEDICINE PET SKULL BASE TO THIGH TECHNIQUE: 8.9 mCi F-18 FDG was injected intravenously. Full-ring PET imaging was performed from the skull base to thigh after the radiotracer. CT data was obtained and used for attenuation correction and anatomic localization. FASTING BLOOD GLUCOSE:  Value: 98 mg/dl COMPARISON:  Chest CT 11/02/2016 FINDINGS: NECK No hypermetabolic lymph nodes in the neck. Multinodular right thyroid goiter noted. The CHEST The 3.4 x 3.3 cm left upper lobe pulmonary lesion is hypermetabolic with SUV max of 194.17 This is consistent with primary lung neoplasm. No other pulmonary lesions are identified. Mild underlying changes of emphysema are again demonstrated. Small bilateral lymph nodes have normal morphology. Minimally hypermetabolic with SUV max of 1.8. These are likely inflammatory changes. No enlarged or hypermetabolic mediastinal or hilar lymph nodes. ABDOMEN/PELVIS No abnormal hypermetabolic activity within the liver, pancreas, adrenal glands, or spleen. No hypermetabolic lymph nodes in the abdomen or pelvis. Additional findings include advanced atherosclerotic calcifications involving the distal aorta and iliac arteries but no  aneurysm. Moderate descending and sigmoid diverticulosis without findings for diverticulitis. 6 mm calculus in the right side of the bladder near the right UVJ but no right-sided hydroureteronephrosis. Moderate prostate gland enlargement and prostate calcifications. Small bilateral inguinal hernias containing fat. There are also bilateral varicoceles. SKELETON No focal hypermetabolic activity to suggest skeletal metastasis. IMPRESSION: 1. 3.4 x  3.3 cm left upper lobe pulmonary lesion is hypermetabolic and consistent with primary lung neoplasm. 2. No mediastinal or hilar lymphadenopathy or findings for metastatic disease. 3. Incidental findings as discussed above. Electronically Signed   By: Marijo Sanes M.D.   On: 11/24/2016 15:45   Discharge Instructions    Discharge patient    Complete by:  As directed    Discharge disposition:  01-Home or Self Care   Discharge patient date:  12/17/2016     CLINICAL DATA:  76 year old male status post VATS, left upper lobectomy 12/12/2016 (pathology revealing invasive poorly differentiated squamous cell carcinoma).  EXAM: PORTABLE CHEST 1 VIEW  COMPARISON:  12/15/2016 and earlier.  FINDINGS: Portable AP semi upright view at 0543 hours. Left chest tube remains in place. Mildly larger lung volumes. No pneumothorax. Probable small volume of left apical pleural fluid. Stable cardiac size and mediastinal contours. Staple line above the left hilum. Mild streaky opacity at the right lung base is stable and most resembles atelectasis.  IMPRESSION: 1. Stable left chest tube.  No pneumothorax. 2. Mild atelectasis.   Electronically Signed   By: Genevie Ann M.D.   On: 12/16/2016 07:47 Discharge Medications: Allergies as of 12/17/2016      Reactions   Nsaids Other (See Comments)   MD RESTRICTS USE OF ANY "BLOOD THINING" MEDS [ "DUE TO GROWTH IN BLADDER" ]    Gadolinium Derivatives Nausea And Vomiting, Other (See Comments)   CHILLS SHAKING FEELING COLD   Penicillins Other (See Comments)   "Feels like my skin is crawling" Has patient had a PCN reaction causing immediate rash, facial/tongue/throat swelling, SOB or lightheadedness with hypotension: No Has patient had a PCN reaction causing severe rash involving mucus membranes or skin necrosis: No Has patient had a PCN reaction that required hospitalization No Has patient had a PCN reaction occurring within the last 10 years: No If all of  the above answers are "NO", then may proceed with Cephalosporin use.      Medication List    TAKE these medications   albuterol 108 (90 Base) MCG/ACT inhaler Commonly known as:  PROVENTIL HFA;VENTOLIN HFA Inhale 2 puffs into the lungs every 6 (six) hours as needed for wheezing or shortness of breath.   BREO ELLIPTA 200-25 MCG/INH Aepb Generic drug:  fluticasone furoate-vilanterol Inhale 1 Inhaler into the lungs daily.   fexofenadine 180 MG tablet Commonly known as:  ALLEGRA Take 180 mg by mouth daily as needed for allergies or rhinitis.   montelukast 10 MG tablet Commonly known as:  SINGULAIR Take 10 mg by mouth at bedtime.   sertraline 50 MG tablet Commonly known as:  ZOLOFT Take 1 tablet by mouth at bedtime.   simvastatin 20 MG tablet Commonly known as:  ZOCOR Take 20 mg by mouth every evening.   traMADol 50 MG tablet Commonly known as:  ULTRAM Take 1 tablet (50 mg total) by mouth every 6 (six) hours as needed for moderate pain.   traZODone 50 MG tablet Commonly known as:  DESYREL Take 50 mg by mouth at bedtime.       Follow Up Appointments: Follow-up Information  [name], MD Follow up on 01/10/2017.   Specialty:  Cardiothoracic Surgery Why:  PA/LAT CXR to be taken (at [place] which is in the same building as Dr. Everrett Coombe office) on at 3:30 pm;Appointment time is at 4:00 pm Contact information: [place] [address] [phone]        Nurse Follow up on 12/23/2016.   Why:  Appointment time is at 10:00 am Contact information: [address]  [place] [address]       [name], MD. Call in 1 day(s).   Specialty:  Internal Medicine Contact information: [address] [place] [address] [phone]           Signed: Terance Hart ContePA-C 12/17/2016, 11:08 AM

## 2016-12-15 NOTE — Progress Notes (Signed)
D/Cd OnQ per order. Black tip seen at end of catheter. No issues during removal. Report given to pts RN.

## 2016-12-15 NOTE — Progress Notes (Signed)
      [address]       [place] [address]             [phone]      No new issues  BP (!) 142/86   Pulse 96   Temp 98.1 F (36.7 C) (Oral)   Resp (!) 23   Ht '5\' 7"'$  (1.702 m)   Wt 179 lb 12.8 oz (81.6 kg)   SpO2 90%   BMI 28.16 kg/m   Awaiting bed on stepdown  [name] C. Roxan Hockey, MD [facility] [phone]

## 2016-12-15 NOTE — Discharge Instructions (Signed)
Thoracoscopy Thoracoscopy is a procedure to examine the lungs and other structures in the chest. This procedure uses a thin, lighted telescope with an attached camera (thoracoscope). This instrument is inserted through a small surgical incision in the chest wall. You may have thoracoscopy to:  Diagnose a chest disease.  Obtain a tissue sample to be examined under a microscope (biopsy).  Have medicines put directly into your lungs.  Remove samples of fluid, blood, or pus from your chest.  Have a minor surgical repair. Tell a health care provider about:  Any allergies you have.  All medicines you are taking, including vitamins, herbs, eye drops, creams, and over-the-counter medicines.  Any problems you or family members have had with anesthetic medicines.  Any blood disorders you have.  Any surgeries you have had.  Any medical conditions you have. What are the risks? Generally, this is a safe procedure. However, problems may occur, including:  Bleeding. If this happens, your surgeon may need to open your chest with a large incision (thoracotomy) to control the bleeding.  Injury to nerves or other structures in your chest.  Collapsed lung. This can happen after the removal of the chest tube that is used for the procedure. If this happens, the tube may need to be reinserted and left in place for a period of time.  Infection.  Heart rhythm abnormalities. What happens before the procedure?  You may need to have tests, such as:  Blood tests.  Urine tests.  Chest X-rays.  Electrocardiogram (ECG).  Follow your health care providers instructions about eating or drinking restrictions.  Ask your health care provider if you should plan to have someone take you home after the procedure.  Ask your health care provider about:  Changing or stopping your regular medicines. This is especially important if you are taking diabetes medicines or blood thinners.  Taking medicines  such as aspirin and ibuprofen. These medicines can thin your blood. Do not take these medicines before your procedure if your health care provider instructs you not to. What happens during the procedure?  An IV tube will be inserted into one of your veins.  You will be given one of the following:  A medicine that numbs the area (local anesthetic). If you are given a local anesthetic, you may also be given a medicine that makes you relax (sedative).  A medicine that makes you fall asleep (general anesthetic).  Your chest area will be cleaned with a germ-killing solution (antiseptic).  Your surgeon will make two or three small incisions under your arm.  Your surgeon will place the thoracoscope through one of the incisions and use it to look inside your chest. The camera will project the images from the scope to a TV monitor in the operating room.  Your surgeon will use the other incisions to insert long, thin surgical instruments to take a sample of lung tissue or do a minor repair.  The scope and instruments will be removed.  A small drain will be left in your chest to drain any blood or fluid that collects after surgery.  The incision will be closed with stitches (sutures).  A bandage (dressing) may be placed over the incision area. The procedure may vary among health care providers and hospitals. What happens after the procedure?  You will spend some time in a recovery area until the anesthetic wears off.  You may be given medicine for pain as needed.  Your blood pressure, heart rate, breathing rate, and blood  oxygen level will be monitored often until the medicines you were given have worn off.  Your drain may be removed before you go home. This information is not intended to replace advice given to you by your health care provider. Make sure you discuss any questions you have with your health care provider. Document Released: 04/30/2003 Document Revised: 04/02/2016 Document  Reviewed: 04/16/2014 Elsevier Interactive Patient Education  2017 [employer].

## 2016-12-15 NOTE — Progress Notes (Signed)
3 Days Post-Op Procedure(s) (LRB): VIDEO ASSISTED THORACOSCOPY (VATS)/LEFT UPPER LOBECTOMY (Left) Subjective: Feels better  Some drainage around tubes last night  Objective: Vital signs in last 24 hours: Temp:  [97 F (36.1 C)-98.7 F (37.1 C)] 98.1 F (36.7 C) (05/03 0740) Pulse Rate:  [63-99] 74 (05/03 0800) Cardiac Rhythm: Normal sinus rhythm (05/03 0800) Resp:  [14-29] 24 (05/03 0800) BP: (95-160)/(57-103) 140/82 (05/03 0800) SpO2:  [7 %-99 %] 99 % (05/03 0800)  Hemodynamic parameters for last 24 hours:    Intake/Output from previous day: 05/02 0701 - 05/03 0700 In: 391.8 [P.O.:240; I.V.:151.8] Out: 800 [Urine:600; Chest Tube:200] Intake/Output this shift: Total I/O In: 10 [I.V.:10] Out: -   General appearance: alert, cooperative and no distress Neurologic: intact Heart: regular rate and rhythm Lungs: diminished breath sounds bibasilar Abdomen: normal findings: soft, non-tender serous drainage on dressing. wound intact  Lab Results:  Recent Labs  12/13/16 0345 12/14/16 0400  WBC 16.9* 15.8*  HGB 12.3* 12.3*  HCT 37.5* 37.3*  PLT 245 223   BMET:  Recent Labs  12/13/16 0345 12/14/16 0400  NA 135 136  K 4.8 5.1  CL 103 104  CO2 27 27  GLUCOSE 135* 147*  BUN 19 13  CREATININE 0.75 0.67  CALCIUM 8.1* 8.6*    PT/INR: No results for input(s): LABPROT, INR in the last 72 hours. ABG    Component Value Date/Time   PHART 7.335 (L) 12/14/2016 0420   HCO3 33.1 (H) 12/14/2016 0420   TCO2 35 12/14/2016 0420   O2SAT 95.0 12/14/2016 0420   CBG (last 3)   Recent Labs  12/14/16 1606 12/14/16 2158 12/15/16 0737  GLUCAP 127* 103* 99    Assessment/Plan: S/P Procedure(s) (LRB): VIDEO ASSISTED THORACOSCOPY (VATS)/LEFT UPPER LOBECTOMY (Left) -POD # 3. Looks better this AM No air leak Minimal drainage from tubes, some drainage around tubes. Posterior Blake drain is clogged- will dc that On-Q completed- remove catheter Increase ambulation Will keep  PCA one more day since anterior tube still in place CBG better Transfer to 4E   LOS: 3 days    [name] 12/15/2016

## 2016-12-16 ENCOUNTER — Inpatient Hospital Stay (HOSPITAL_COMMUNITY): Payer: Medicare Other

## 2016-12-16 LAB — BASIC METABOLIC PANEL
Anion gap: 6 (ref 5–15)
BUN: 14 mg/dL (ref 6–20)
CALCIUM: 8.5 mg/dL — AB (ref 8.9–10.3)
CO2: 33 mmol/L — ABNORMAL HIGH (ref 22–32)
Chloride: 98 mmol/L — ABNORMAL LOW (ref 101–111)
Creatinine, Ser: 0.75 mg/dL (ref 0.61–1.24)
GFR calc Af Amer: >60 mL/min (ref >60)
GLUCOSE: 104 mg/dL — AB (ref 65–99)
POTASSIUM: 3.9 mmol/L (ref 3.5–5.1)
SODIUM: 137 mmol/L (ref 135–145)

## 2016-12-16 LAB — CBC
HCT: 36.7 % — ABNORMAL LOW (ref 39.0–52.0)
Hemoglobin: 11.8 g/dL — ABNORMAL LOW (ref 13.0–17.0)
MCH: 30.6 pg (ref 26.0–34.0)
MCHC: 32.2 g/dL (ref 30.0–36.0)
MCV: 95.3 fL (ref 78.0–100.0)
PLATELETS: 285 10*3/uL (ref 150–400)
RBC: 3.85 MIL/uL — AB (ref 4.22–5.81)
RDW: 13.8 % (ref 11.5–15.5)
WBC: 11.4 10*3/uL — AB (ref 4.0–10.5)

## 2016-12-16 LAB — GLUCOSE, CAPILLARY
GLUCOSE-CAPILLARY: 89 mg/dL (ref 65–99)
GLUCOSE-CAPILLARY: 144 mg/dL — AB (ref 65–99)
Glucose-Capillary: 92 mg/dL (ref 65–99)
Glucose-Capillary: 113 mg/dL — ABNORMAL HIGH (ref 65–99)

## 2016-12-16 MED ORDER — TRAMADOL HCL 50 MG PO TABS
50.0000 mg | ORAL_TABLET | Freq: Four times a day (QID) | ORAL | Status: DC | PRN
  Administered 2016-12-16 – 2016-12-17 (×4): 50 mg via ORAL
  Filled 2016-12-16 (×4): qty 1

## 2016-12-16 NOTE — Progress Notes (Signed)
4 Days Post-Op Procedure(s) (LRB): VIDEO ASSISTED THORACOSCOPY (VATS)/LEFT UPPER LOBECTOMY (Left) Subjective: Some incisional/ chest tube pain Using pCA sparingly  Objective: Vital signs in last 24 hours: Temp:  [97.8 F (36.6 C)-98.1 F (36.7 C)] 98 F (36.7 C) (05/04 0400) Pulse Rate:  [59-97] 69 (05/04 0700) Cardiac Rhythm: Normal sinus rhythm (05/04 0400) Resp:  [16-28] 16 (05/04 0700) BP: (92-157)/(55-119) 115/66 (05/04 0700) SpO2:  [90 %-100 %] 98 % (05/04 0700)  Hemodynamic parameters for last 24 hours:    Intake/Output from previous day: 05/03 0701 - 05/04 0700 In: 960 [P.O.:720; I.V.:240] Out: 1385 [Urine:1325; Chest Tube:60] Intake/Output this shift: No intake/output data recorded.  General appearance: alert, cooperative and no distress Neurologic: intact Heart: regular rate and rhythm Lungs: diminished breath sounds bilaterally Abdomen: normal findings: soft, non-tender Wound: cleana and dry no air leak  Lab Results:  Recent Labs  12/14/16 0400 12/16/16 0229  WBC 15.8* 11.4*  HGB 12.3* 11.8*  HCT 37.3* 36.7*  PLT 223 285   BMET:  Recent Labs  12/14/16 0400 12/16/16 0229  NA 136 137  K 5.1 3.9  CL 104 98*  CO2 27 33*  GLUCOSE 147* 104*  BUN 13 14  CREATININE 0.67 0.75  CALCIUM 8.6* 8.5*    PT/INR: No results for input(s): LABPROT, INR in the last 72 hours. ABG    Component Value Date/Time   PHART 7.335 (L) 12/14/2016 0420   HCO3 33.1 (H) 12/14/2016 0420   TCO2 35 12/14/2016 0420   O2SAT 95.0 12/14/2016 0420   CBG (last 3)   Recent Labs  12/15/16 1557 12/15/16 1925 12/15/16 2101  GLUCAP 120* 129* 99    Assessment/Plan: S/P Procedure(s) (LRB): VIDEO ASSISTED THORACOSCOPY (VATS)/LEFT UPPER LOBECTOMY (Left) Plan for transfer to step-down: see transfer orders  Still awaiting step down bed No air leak- dc chest tube Continue ambulation Dc PCA Will try off O2 Possibly home tomorrow   LOS: 4 days    [name] 12/16/2016

## 2016-12-16 NOTE — Progress Notes (Signed)
17 ml (9mg/ml) of fentanyl wasted and witnessed by KLerry Paterson JLeisa Lenz

## 2016-12-17 ENCOUNTER — Inpatient Hospital Stay (HOSPITAL_COMMUNITY): Payer: Medicare Other

## 2016-12-17 LAB — GLUCOSE, CAPILLARY
Glucose-Capillary: 106 mg/dL — ABNORMAL HIGH (ref 65–99)
Glucose-Capillary: 105 mg/dL — ABNORMAL HIGH (ref 65–99)

## 2016-12-17 MED ORDER — TRAMADOL HCL 50 MG PO TABS: 50.0000 mg | ORAL_TABLET | Freq: Four times a day (QID) | ORAL | 0 refills | Status: DC | PRN

## 2016-12-17 NOTE — Progress Notes (Signed)
Discharge instructions given to patient all questions answered at this time.  Prescription given to patient.  Travel 02 delivered, home 02 already in place.  Pt. VSS with no s/s of distress noted.  Patient stable for discharge.

## 2016-12-17 NOTE — Progress Notes (Signed)
[address]       [place], [address]             [phone]                 5 Days Post-Op Procedure(s) (LRB): VIDEO ASSISTED THORACOSCOPY (VATS)/LEFT UPPER LOBECTOMY (Left)  LOS: 5 days   Subjective: Feels well  Objective: Vital signs in last 24 hours: Patient Vitals for the past 24 hrs:  BP Temp Temp src Pulse Resp SpO2  12/17/16 0700 (!) 105/54 98.4 F (36.9 C) Oral 74 (!) 21 96 %  12/17/16 0326 113/61 98 F (36.7 C) Oral 89 (!) 23 97 %  12/16/16 2300 (!) 117/45 97.8 F (36.6 C) Oral 80 (!) 22 94 %  12/16/16 1920 (!) 144/76 98.2 F (36.8 C) Oral (!) 101 (!) 23 97 %  12/16/16 1639 120/62 - - 77 17 99 %  12/16/16 1635 - 98.2 F (36.8 C) Oral - - -  12/16/16 1517 - 98.2 F (36.8 C) Oral - - -  12/16/16 1500 (!) 92/53 - - 94 (!) 21 (!) 81 %  12/16/16 1400 102/60 - - (!) 106 (!) 23 (!) 86 %  12/16/16 1300 (!) 106/56 - - - 19 -  12/16/16 1200 119/65 - - 66 (!) 29 (!) 55 %  12/16/16 1129 - 98 F (36.7 C) Oral - - -    Filed Weights   12/13/16 0700 12/14/16 0400 12/14/16 0600  Weight: 182 lb 8.7 oz (82.8 kg) 183 lb 6.8 oz (83.2 kg) 179 lb 12.8 oz (81.6 kg)    Hemodynamic parameters for last 24 hours:    Intake/Output from previous day: 05/04 0701 - 05/05 0700 In: 350 [P.O.:240; I.V.:110] Out: 775 [Urine:775] Intake/Output this shift: Total I/O In: 240 [P.O.:240] Out: 300 [Urine:300]  Scheduled Meds: . acetaminophen  1,000 mg Oral Q6H   Or  . acetaminophen (TYLENOL) oral liquid 160 mg/5 mL  1,000 mg Oral Q6H  . bisacodyl  10 mg Oral Daily  . enoxaparin (LOVENOX) injection  40 mg Subcutaneous Q24H  . fluticasone furoate-vilanterol  1 puff Inhalation Daily  . insulin aspart  0-15 Units Subcutaneous TID WC  . montelukast  10 mg Oral QHS  . pantoprazole  40 mg Oral Daily  . senna-docusate  1 tablet Oral QHS  . sertraline  50 mg Oral QHS  . simvastatin  20 mg Oral QPM  . traZODone  50 mg Oral QHS   Continuous Infusions: . sodium  chloride 10 mL/hr at 12/16/16 0400  . potassium chloride (KCL MULTIRUN) 30 mEq in 265 mL IVPB     PRN Meds:.levalbuterol, ondansetron (ZOFRAN) IV, potassium chloride (KCL MULTIRUN) 30 mEq in 265 mL IVPB, traMADol  General appearance: alert and cooperative Neurologic: intact Heart: regular rate and rhythm, S1, S2 normal, no murmur, click, rub or gallop Lungs: clear to auscultation bilaterally Abdomen: soft, non-tender; bowel sounds normal; no masses,  no organomegaly Extremities: extremities normal, atraumatic, no cyanosis or edema and Homans sign is negative, no sign of DVT Wound: intact  Lab Results: CBC: Recent Labs  12/16/16 0229  WBC 11.4*  HGB 11.8*  HCT 36.7*  PLT 285   BMET:  Recent Labs  12/16/16 0229  NA 137  K 3.9  CL 98*  CO2 33*  GLUCOSE 104*  BUN 14  CREATININE 0.75  CALCIUM 8.5*    PT/INR: No results for input(s): LABPROT, INR in the last 72 hours.  Radiology Dg Chest 2 View  Result Date: 12/17/2016 CLINICAL DATA:  Status post partial lobectomy EXAM: CHEST  2 VIEW COMPARISON:  Chest radiograph from one day prior. FINDINGS: Stable cardiomediastinal silhouette with normal heart size. Status post partial left upper lobectomy with associated volume loss in the left hemithorax. Trace basilar left pleural effusion. No right pleural effusion. No right pneumothorax. Small fluid level is present in the anterior apical lungs on the lateral view. Mild patchy bibasilar lung opacities appear unchanged. IMPRESSION: 1. Small fluid level in the anterior apical lungs on the lateral view, favored to represent a tiny hydropneumothorax at the left upper lobectomy site. 2. Mild patchy bibasilar lung opacities, stable, favor mild atelectasis. Electronically Signed   By: Ilona Sorrel M.D.   On: 12/17/2016 07:06   Dg Chest 1v Repeat Same Day  Result Date: 12/16/2016 CLINICAL DATA:  Status post left chest tube removal today. The patient underwent left upper lobectomy 12/12/2016.  EXAM: CHEST - 1 VIEW SAME DAY COMPARISON:  Single-view of the chest earlier today and 12/15/2016. FINDINGS: Left chest tube has been removed. No pneumothorax is identified. Volume loss in the left chest consistent with left upper lobectomy is again seen. Basilar atelectasis is noted. IMPRESSION: Negative for pneumothorax after left chest removal. No new abnormality. Electronically Signed   By: Inge Rise M.D.   On: 12/16/2016 11:31   Dg Chest Port 1 View  Result Date: 12/16/2016 CLINICAL DATA:  76 year old male status post VATS, left upper lobectomy 12/12/2016 (pathology revealing invasive poorly differentiated squamous cell carcinoma). EXAM: PORTABLE CHEST 1 VIEW COMPARISON:  12/15/2016 and earlier. FINDINGS: Portable AP semi upright view at 0543 hours. Left chest tube remains in place. Mildly larger lung volumes. No pneumothorax. Probable small volume of left apical pleural fluid. Stable cardiac size and mediastinal contours. Staple line above the left hilum. Mild streaky opacity at the right lung base is stable and most resembles atelectasis. IMPRESSION: 1. Stable left chest tube.  No pneumothorax. 2. Mild atelectasis. Electronically Signed   By: Genevie Ann M.D.   On: 12/16/2016 07:47     Assessment/Plan: S/P Procedure(s) (LRB): VIDEO ASSISTED THORACOSCOPY (VATS)/LEFT UPPER LOBECTOMY (Left) Mobilize Home on O2 already has o2 at home Patient wants to go home today , plan d/c  Grace Isaac MD 12/17/2016 10:59 AM

## 2016-12-17 NOTE — Progress Notes (Signed)
SATURATION QUALIFICATIONS: (This note is used to comply with regulatory documentation for home oxygen)  Patient Saturations on Room Air at Rest = 90%  Patient Saturations on Room Air while Ambulating = 73%  Patient Saturations on 3 Liters of oxygen while Ambulating = 90%  Please briefly explain why patient needs home oxygen:Pt. Unable to maintain oxygen saturation with ambulation.

## 2016-12-17 NOTE — Care Management Note (Signed)
Case Management Note  Patient Details  Name: [name] MRN: [id number] Date of Birth: 1940-11-20  Subjective/Objective:                    Action/Plan:   Expected Discharge Date:  12/17/16               Expected Discharge Plan:  Home/Self Care  In-House Referral:     Discharge planning Services  CM Consult  Post Acute Care Choice:    Choice offered to:  Patient  DME Arranged:  Oxygen DME Agency:  [place]:  NA [place] Agency:  NA  Status of Service:  Completed, signed off  If discussed at [place] Part of Stay Meetings, dates discussed:    Additional Comments: CM received call from RN as pt will now have continuous O2 (he was adm having PM O2 only).  CM called referral to [facility] rep, Jermaine who will arrange change at home.  CM notified [facility] delivery rep, Reggie to please deliver a transport tank for pt to go home. No other CM needs were communicated. Dellie Catholic, RN 12/17/2016, 2:06 PM

## 2016-12-19 ENCOUNTER — Telehealth: Payer: Self-pay | Admitting: *Deleted

## 2016-12-19 ENCOUNTER — Other Ambulatory Visit: Payer: Self-pay

## 2016-12-19 DIAGNOSIS — J449 Chronic obstructive pulmonary disease, unspecified: Secondary | ICD-10-CM

## 2016-12-19 NOTE — Telephone Encounter (Signed)
Oncology Nurse Navigator Documentation  Oncology Nurse Navigator Flowsheets 12/05/2016  Navigator Location [facility]  Navigator Encounter Type Telephone/I called [name] to check on him after his surgery.  He states he is doing ok. He is have some SOB on excretion and is on O2.  I listened as he explained.  It did seem like he needs a smaller tank to get around with.  I asked him if he contacted Dr. Bari Mantis office.  He states he has and will wait to hear from them.  [name] was thankful for the call to check on his condition.   Telephone Outgoing Call  Treatment Phase Pre-Tx/Tx Discussion  Barriers/Navigation Needs Education  Education Other  Interventions Education  Education Method Verbal  Acuity Level 1  Time Spent with Patient 30

## 2016-12-19 NOTE — Telephone Encounter (Signed)
CM received call from Dr. Leonarda Salon RN  stating pt's tank from [facility] for transport was empty and did not have concentrator at home.    This CM received call from pt's RN while in hospital to have [facility] deliver a tank to the room for transport home as pt was going from nocturnal O2 to continuous. CM requested pulmonary sat note and new O2 flow orders.  This CM then called referral to the [facility] reps, Jermaine for authorization and [facility] rep, Reggie to please deliver the tank to room for transport home. [facility] rep brought a tank to the room for transport home.  Prompted by today's call, this CM called Mr. Parisi who says he DOES have a concentrator at home and is using it but needs portable tanks to go out of the house.  This CM called [facility] rep, Jermaine who says he will take of it.  This CM called pt back and pt tells this CM the Vendor he uses is [facility] (not [facility]).  This CM called [employer] rep, [place] who has access to [facility] and states she will call Dr. Leonarda Salon office to get a signed order and has access to pulmonary saturation note.    This CM called pt back and gave pt Mandy's number to call if he has not heard from [place] by 15:00pm today.  Mr. Nickson expressed appreciation; No other CM needs were communicated.

## 2016-12-21 ENCOUNTER — Telehealth (HOSPITAL_COMMUNITY): Payer: Self-pay | Admitting: Physician Assistant

## 2016-12-21 DIAGNOSIS — I10 Essential (primary) hypertension: Secondary | ICD-10-CM | POA: Diagnosis not present

## 2016-12-21 DIAGNOSIS — R8299 Other abnormal findings in urine: Secondary | ICD-10-CM | POA: Diagnosis not present

## 2016-12-21 DIAGNOSIS — N390 Urinary tract infection, site not specified: Secondary | ICD-10-CM | POA: Diagnosis not present

## 2016-12-21 NOTE — Telephone Encounter (Signed)
      [address]       [place] [address]             [phone]    Wade L Grulke [phone]   S/P  Left video-assisted thoracoscopy, thoracoscopic lingular sparing left upper lobectomy, mediastinal lymph node dissection performed on 12/12/2016.  Discharged home on 12/17/2016.   Medications: Current Outpatient Prescriptions on File Prior to Visit  Medication Sig Dispense Refill  . albuterol (PROVENTIL HFA;VENTOLIN HFA) 108 (90 Base) MCG/ACT inhaler Inhale 2 puffs into the lungs every 6 (six) hours as needed for wheezing or shortness of breath.     Marland Kitchen BREO ELLIPTA 200-25 MCG/INH AEPB Inhale 1 Inhaler into the lungs daily.  2  . fexofenadine (ALLEGRA) 180 MG tablet Take 180 mg by mouth daily as needed for allergies or rhinitis.    Marland Kitchen montelukast (SINGULAIR) 10 MG tablet Take 10 mg by mouth at bedtime.    . sertraline (ZOLOFT) 50 MG tablet Take 1 tablet by mouth at bedtime.  5  . simvastatin (ZOCOR) 20 MG tablet Take 20 mg by mouth every evening.    . traMADol (ULTRAM) 50 MG tablet Take 1 tablet (50 mg total) by mouth every 6 (six) hours as needed for moderate pain. 30 tablet 0  . traZODone (DESYREL) 50 MG tablet Take 50 mg by mouth at bedtime.  2   No current facility-administered medications on file prior to visit.     Coumadin:  INR check Yes/No  Problems/Concerns: shortness of breath, weakness when walking  Assessment:  Patient feels weak and short of breath.   He had been covering his incision when showering and just showed this morning for the first time since the hospital. I shared that unless Dr. Roxan Hockey told him differently, he can get his incisions wet and will need to wash his incisions with soap and water. He is using the oxygen as instructed.  He knows about his follow-up appointment on Friday for staple removal. Contact office if concerns or problems develop. He had no issues getting his prescriptions. He went to see his PCP today who said he was doing  very well. He is encouraged to continue increasing his exercise level. If his oxygen requirements increase or if he becomes more short of breath he is to call the office.   Follow up Appointment:   [name], MD Follow up on 01/10/2017.   Specialty:  Cardiothoracic Surgery Why:  PA/LAT CXR to be taken (at [facility] which is in the same building as Dr. Everrett Coombe office) on at 3:30 pm;Appointment time is at 4:00 pm Contact information: [place] [id number] [phone]  Jefferie Holston, PA-C

## 2016-12-22 ENCOUNTER — Other Ambulatory Visit: Payer: Self-pay | Admitting: *Deleted

## 2016-12-23 ENCOUNTER — Ambulatory Visit (INDEPENDENT_AMBULATORY_CARE_PROVIDER_SITE_OTHER): Payer: Self-pay | Admitting: *Deleted

## 2016-12-23 DIAGNOSIS — C3432 Malignant neoplasm of lower lobe, left bronchus or lung: Secondary | ICD-10-CM

## 2016-12-23 DIAGNOSIS — Z4802 Encounter for removal of sutures: Secondary | ICD-10-CM

## 2016-12-23 DIAGNOSIS — Z902 Acquired absence of lung [part of]: Secondary | ICD-10-CM

## 2016-12-23 NOTE — Progress Notes (Signed)
Mr. Berenson returns to the office s/p L VATS, LLLobectomy on 12/05/16. He is doing well except for his breathing. He says his O2 sat drops to 74% when he exerts himself while not using the oxygen. He was on 3L hs before surgery. Discharge instructions ordered to use O2 continuously. I reinforced this. His diet and bowels are good. He is using Tramadol sparingly. The incisions are very well healed and two sutures were easily removed from the previous chest tube sites. He will return as scheduled with a CXR.

## 2016-12-26 ENCOUNTER — Encounter: Payer: Self-pay | Admitting: *Deleted

## 2016-12-26 NOTE — Progress Notes (Signed)
Oncology Nurse Navigator Documentation  Oncology Nurse Navigator Flowsheets 12/26/2016  Navigator Location [facility]  Navigator Encounter Type Other/I updated Dr. Julien Nordmann that Mr. Mccarley has been released from the hospital.  He states he would like to see patient in a few weeks.  I notified scheduling to call and schedule at Dr. Worthy Flank next available.   Treatment Phase Other  Barriers/Navigation Needs Coordination of Care  Interventions Coordination of Care  Coordination of Care Other  Acuity Level 2  Acuity Level 2 Other  Time Spent with Patient 15

## 2016-12-27 ENCOUNTER — Telehealth: Payer: Self-pay | Admitting: Internal Medicine

## 2016-12-27 NOTE — Telephone Encounter (Signed)
Scheduled appt per sch message from Jim Like 5/14 - patients wife is aware of appt date and time.

## 2017-01-06 ENCOUNTER — Other Ambulatory Visit: Payer: Self-pay | Admitting: Thoracic Surgery (Cardiothoracic Vascular Surgery)

## 2017-01-06 DIAGNOSIS — R918 Other nonspecific abnormal finding of lung field: Secondary | ICD-10-CM

## 2017-01-10 ENCOUNTER — Encounter: Payer: Medicare Other | Admitting: Thoracic Surgery (Cardiothoracic Vascular Surgery)

## 2017-01-16 DIAGNOSIS — N3281 Overactive bladder: Secondary | ICD-10-CM | POA: Diagnosis not present

## 2017-01-16 DIAGNOSIS — C3490 Malignant neoplasm of unspecified part of unspecified bronchus or lung: Secondary | ICD-10-CM | POA: Diagnosis not present

## 2017-01-16 DIAGNOSIS — F17200 Nicotine dependence, unspecified, uncomplicated: Secondary | ICD-10-CM | POA: Diagnosis not present

## 2017-01-16 DIAGNOSIS — G4733 Obstructive sleep apnea (adult) (pediatric): Secondary | ICD-10-CM | POA: Diagnosis not present

## 2017-01-16 DIAGNOSIS — I10 Essential (primary) hypertension: Secondary | ICD-10-CM | POA: Diagnosis not present

## 2017-01-16 DIAGNOSIS — Z6829 Body mass index (BMI) 29.0-29.9, adult: Secondary | ICD-10-CM | POA: Diagnosis not present

## 2017-01-16 DIAGNOSIS — F3289 Other specified depressive episodes: Secondary | ICD-10-CM | POA: Diagnosis not present

## 2017-01-16 DIAGNOSIS — R0901 Asphyxia: Secondary | ICD-10-CM | POA: Diagnosis not present

## 2017-01-17 ENCOUNTER — Ambulatory Visit
Admission: RE | Admit: 2017-01-17 | Discharge: 2017-01-17 | Disposition: A | Discharge status: Home / Self Care | Payer: Medicare Other | Source: Ambulatory Visit | Attending: Thoracic Surgery (Cardiothoracic Vascular Surgery) | Admitting: Thoracic Surgery (Cardiothoracic Vascular Surgery)

## 2017-01-17 ENCOUNTER — Ambulatory Visit (INDEPENDENT_AMBULATORY_CARE_PROVIDER_SITE_OTHER): Payer: Self-pay | Admitting: Thoracic Surgery (Cardiothoracic Vascular Surgery)

## 2017-01-17 ENCOUNTER — Encounter: Payer: Self-pay | Admitting: Thoracic Surgery (Cardiothoracic Vascular Surgery)

## 2017-01-17 VITALS — BP 135/75 | HR 72 | Resp 16 | Ht 66.0 in | Wt 174.4 lb

## 2017-01-17 DIAGNOSIS — C3432 Malignant neoplasm of lower lobe, left bronchus or lung: Secondary | ICD-10-CM

## 2017-01-17 DIAGNOSIS — R0602 Shortness of breath: Secondary | ICD-10-CM | POA: Diagnosis not present

## 2017-01-17 DIAGNOSIS — Z902 Acquired absence of lung [part of]: Secondary | ICD-10-CM

## 2017-01-17 DIAGNOSIS — R918 Other nonspecific abnormal finding of lung field: Secondary | ICD-10-CM

## 2017-01-17 NOTE — Progress Notes (Signed)
[address]       [place] [address]             [phone]    HPI: Mr. Barnier returns for a scheduled postoperative follow-up visit  Mr. Goeden is a 76 year old gentleman with a history of tobacco abuse and COPD who underwent a lingular sparing left upper lobectomy for stage IB squamous cell carcinoma on 12/12/2016. He went home on postoperative day #5. He been on 3 L nasal cannula oxygen at night prior to surgery. He went home on oxygen around-the-clock, but is now back on nightly 02. He has some incisional discomfort is not requiring any narcotics. He has been walking on a regular basis area he has not noted a major effect on his breathing. He does complain of feeling fatigued later in the day.  Past Medical History:  Diagnosis Date  . Anxiety    pt. reports that he " could swing from the ......" because he is so anxious about the surgery   . Arthritis    R hand tendonitis   . COPD (chronic obstructive pulmonary disease) ([place])   . Depression   . Hyperlipidemia   . Sleep apnea    cannot tolerate cpap    Current Outpatient Prescriptions  Medication Sig Dispense Refill  . albuterol (PROVENTIL HFA;VENTOLIN HFA) 108 (90 Base) MCG/ACT inhaler Inhale 2 puffs into the lungs every 6 (six) hours as needed for wheezing or shortness of breath.     Marland Kitchen BREO ELLIPTA 200-25 MCG/INH AEPB Inhale 1 Inhaler into the lungs daily.  2  . fexofenadine (ALLEGRA) 180 MG tablet Take 180 mg by mouth daily as needed for allergies or rhinitis.    Marland Kitchen montelukast (SINGULAIR) 10 MG tablet Take 10 mg by mouth at bedtime.    . sertraline (ZOLOFT) 50 MG tablet Take 1 tablet by mouth at bedtime.  5  . simvastatin (ZOCOR) 20 MG tablet Take 20 mg by mouth every evening.     No current facility-administered medications for this visit.     Physical Exam BP 135/75 (BP Location: Right Arm, Patient Position: Sitting, Cuff Size: Large)   Pulse 72   Resp 16   Ht 5\' 6"  (1.676 m)   Wt 174 lb 6.4  oz (79.1 kg)   SpO2 95% Comment: ON RA  BMI 28.47 kg/m  76 year old man in no acute distress Alert and oriented 3 with no focal deficits Cardiac regular rate and rhythm normal S1 and S2 Lungs slightly diminished at left base, otherwise clear, no wheezing Incisions well healed  Diagnostic Tests: CHEST  2 VIEW  COMPARISON:  12/17/2016  FINDINGS: Stable heart size.  Atherosclerotic calcification aorta.  Prominent AP window, suspect related to distortion of hilum and LEFT pulmonary artery resulting from previous LEFT upper lobe resection little changed.  Scattered infiltrates persist in remaining LEFT lung.  Underlying emphysematous changes in RIGHT lung, which remains clear.  No pleural effusion or pneumothorax.  Bones unremarkable.  IMPRESSION: Postsurgical changes in the LEFT hemithorax from LEFT upper lobe resection with associated volume loss and prominence of AP window.  Persistent LEFT lung infiltrate.  Underlying emphysematous changes.  Aortic atherosclerosis.   Electronically Signed   By: Lavonia Dana M.D.   On: 01/17/2017 11:47 I personally reviewed the chest x-ray and concur with the findings noted above.  Impression: Mr. Ivins is a 76 year old gentleman who underwent a lingular sparing left upper lobectomy for a stage IB squamous cell carcinoma about  5 weeks ago. He is doing well at this time. He is having only mild pain and is not requiring narcotics. His respiratory status is not significantly diminished from his preoperative state. He does complain of fatigue especially later in the day. That is typical and should continue to improve over the next several weeks.  He has a follow-up appointment scheduled with Dr. Julien Nordmann. I will defer follow-up screening to him. I will plan to see Mr. Nancarrow back periodically.  He may begin driving. Appropriate precautions were discussed. He may gradually increase his other activities as  tolerated.  Plan: Return in 2 months with PA and lateral chest x-ray.  [name], MD [facility] [phone]

## 2017-01-18 ENCOUNTER — Other Ambulatory Visit: Payer: Self-pay | Admitting: Medical Oncology

## 2017-01-18 DIAGNOSIS — R918 Other nonspecific abnormal finding of lung field: Secondary | ICD-10-CM

## 2017-01-19 ENCOUNTER — Ambulatory Visit (HOSPITAL_BASED_OUTPATIENT_CLINIC_OR_DEPARTMENT_OTHER): Payer: Medicare Other | Admitting: Internal Medicine

## 2017-01-19 ENCOUNTER — Telehealth: Payer: Self-pay | Admitting: Internal Medicine

## 2017-01-19 ENCOUNTER — Encounter: Payer: Self-pay | Admitting: Internal Medicine

## 2017-01-19 ENCOUNTER — Other Ambulatory Visit (HOSPITAL_BASED_OUTPATIENT_CLINIC_OR_DEPARTMENT_OTHER): Payer: Medicare Other

## 2017-01-19 DIAGNOSIS — C3492 Malignant neoplasm of unspecified part of left bronchus or lung: Secondary | ICD-10-CM

## 2017-01-19 DIAGNOSIS — C3412 Malignant neoplasm of upper lobe, left bronchus or lung: Secondary | ICD-10-CM

## 2017-01-19 DIAGNOSIS — I10 Essential (primary) hypertension: Secondary | ICD-10-CM

## 2017-01-19 DIAGNOSIS — R918 Other nonspecific abnormal finding of lung field: Secondary | ICD-10-CM

## 2017-01-19 DIAGNOSIS — J449 Chronic obstructive pulmonary disease, unspecified: Secondary | ICD-10-CM | POA: Diagnosis not present

## 2017-01-19 LAB — CBC WITH DIFFERENTIAL/PLATELET
BASO%: 0.9 % (ref 0.0–2.0)
Basophils Absolute: 0.1 10*3/uL (ref 0.0–0.1)
EOS ABS: 0.4 10*3/uL (ref 0.0–0.5)
EOS%: 4 % (ref 0.0–7.0)
HEMATOCRIT: 43.6 % (ref 38.4–49.9)
HEMOGLOBIN: 14.9 g/dL (ref 13.0–17.1)
LYMPH#: 1 10*3/uL (ref 0.9–3.3)
LYMPH%: 9.6 % — ABNORMAL LOW (ref 14.0–49.0)
MCH: 31.8 pg (ref 27.2–33.4)
MCHC: 34.1 g/dL (ref 32.0–36.0)
MCV: 93.3 fL (ref 79.3–98.0)
MONO#: 1.7 10*3/uL — ABNORMAL HIGH (ref 0.1–0.9)
MONO%: 16.5 % — ABNORMAL HIGH (ref 0.0–14.0)
NEUT#: 7 10*3/uL — ABNORMAL HIGH (ref 1.5–6.5)
NEUT%: 69 % (ref 39.0–75.0)
Platelets: 290 10*3/uL (ref 140–400)
RBC: 4.67 10*6/uL (ref 4.20–5.82)
RDW: 14 % (ref 11.0–14.6)
WBC: 10.1 10*3/uL (ref 4.0–10.3)

## 2017-01-19 LAB — COMPREHENSIVE METABOLIC PANEL
ALBUMIN: 3.3 g/dL — AB (ref 3.5–5.0)
ALK PHOS: 97 U/L (ref 40–150)
ALT: 13 U/L (ref 0–55)
AST: 12 U/L (ref 5–34)
Anion Gap: 6 mEq/L (ref 3–11)
BILIRUBIN TOTAL: 0.49 mg/dL (ref 0.20–1.20)
BUN: 13.3 mg/dL (ref 7.0–26.0)
CALCIUM: 9.6 mg/dL (ref 8.4–10.4)
CO2: 31 mEq/L — ABNORMAL HIGH (ref 22–29)
Chloride: 102 mEq/L (ref 98–109)
Creatinine: 0.7 mg/dL (ref 0.7–1.3)
EGFR: >90 mL/min/{1.73_m2} (ref >90)
Glucose: 86 mg/dl (ref 70–140)
Potassium: 4.4 mEq/L (ref 3.5–5.1)
Sodium: 139 mEq/L (ref 136–145)
Total Protein: 6.8 g/dL (ref 6.4–8.3)

## 2017-01-19 NOTE — Telephone Encounter (Signed)
Appointments scheduled per 01/19/17 los. Patient was given a copy of the AVS report and appointment schedule, per 01/19/17 los.

## 2017-01-19 NOTE — Progress Notes (Signed)
[facility] [phone]   [phone]  OFFICE PROGRESS NOTE  David Solian, MD [place] [address]  DIAGNOSIS: Stage IB (T2a, N0, M0) non-small cell lung cancer, poorly differentiated invasive squamous cell carcinoma diagnosed in April 2018  PRIOR THERAPY: Status post left upper lobectomy with lymph node dissection under the care of Dr. Roxan Hockey on 12/12/2016 with tumor size of 3.2 cm.  CURRENT THERAPY: Observation.  INTERVAL HISTORY: [name] 76 y.o. male returns to the clinic today for follow-up visit accompanied by his wife. The patient is feeling fine today with no specific complaints except for pain at the left side of the chest performed the surgical scar. He denied having any shortness of breath, cough or hemoptysis. He denied having any fever or chills. He has few pounds of weight loss recently because of lack of taste. He denied having any nausea, vomiting, diarrhea or constipation. He has no fever or chills. He underwent left upper lobectomy with lymph node dissection under the care of Dr. Roxan Hockey 5 weeks ago and he is here for evaluation and discussion of his treatment options.   MEDICAL HISTORY: Past Medical History:  Diagnosis Date  . Anxiety    pt. reports that he " could swing from the ......" because he is so anxious about the surgery   . Arthritis    R hand tendonitis   . COPD (chronic obstructive pulmonary disease) ([place])   . Depression   . Hyperlipidemia   . Sleep apnea    cannot tolerate cpap    ALLERGIES:  is allergic to nsaids; gadolinium derivatives; and penicillins.  MEDICATIONS:  Current Outpatient Prescriptions  Medication Sig Dispense Refill  . albuterol (PROVENTIL HFA;VENTOLIN HFA) 108 (90 Base) MCG/ACT inhaler Inhale 2 puffs into the lungs every 6 (six) hours as needed for wheezing or shortness of breath.     Marland Kitchen BREO ELLIPTA 200-25 MCG/INH AEPB Inhale 1 Inhaler into the lungs  daily.  2  . fexofenadine (ALLEGRA) 180 MG tablet Take 180 mg by mouth daily as needed for allergies or rhinitis.    Marland Kitchen montelukast (SINGULAIR) 10 MG tablet Take 10 mg by mouth at bedtime.    . sertraline (ZOLOFT) 50 MG tablet Take 1 tablet by mouth at bedtime.  5  . simvastatin (ZOCOR) 20 MG tablet Take 20 mg by mouth every evening.     No current facility-administered medications for this visit.     SURGICAL HISTORY:  Past Surgical History:  Procedure Laterality Date  . COLONOSCOPY W/ POLYPECTOMY    . cyst back  2011  . CYSTOSCOPY  2012   for bleeding in bladder x 3  . MOUTH SURGERY    . VIDEO ASSISTED THORACOSCOPY (VATS)/ LOBECTOMY Left 12/12/2016   Procedure: VIDEO ASSISTED THORACOSCOPY (VATS)/LEFT UPPER LOBECTOMY;  Surgeon: [name], MD;  Location: [facility];  Service: Thoracic;  Laterality: Left;    REVIEW OF SYSTEMS:  Constitutional: negative Eyes: negative Ears, nose, mouth, throat, and face: negative Respiratory: positive for pleurisy/chest pain Cardiovascular: negative Gastrointestinal: negative Genitourinary:negative Integument/breast: negative Hematologic/lymphatic: negative Musculoskeletal:negative Neurological: negative Behavioral/Psych: negative Endocrine: negative Allergic/Immunologic: negative   PHYSICAL EXAMINATION: General appearance: alert, cooperative and no distress Head: Normocephalic, without obvious abnormality, atraumatic Neck: no adenopathy, no JVD, supple, symmetrical, trachea midline and thyroid not enlarged, symmetric, no tenderness/mass/nodules Lymph nodes: Cervical, supraclavicular, and axillary nodes normal. Resp: clear to auscultation bilaterally Back: symmetric, no curvature. ROM normal. No CVA tenderness. Cardio: regular  rate and rhythm, S1, S2 normal, no murmur, click, rub or gallop GI: soft, non-tender; bowel sounds normal; no masses,  no organomegaly Extremities: extremities normal, atraumatic, no cyanosis or edema Neurologic:  Alert and oriented X 3, normal strength and tone. Normal symmetric reflexes. Normal coordination and gait  ECOG PERFORMANCE STATUS: 1 - Symptomatic but completely ambulatory  Blood pressure (!) 149/86, pulse 78, temperature 97.6 F (36.4 C), temperature source Oral, resp. rate 18, height 5\' 6"  (1.676 m), weight 171 lb 11.2 oz (77.9 kg), SpO2 100 %.  LABORATORY DATA: Lab Results  Component Value Date   WBC 10.1 01/19/2017   HGB 14.9 01/19/2017   HCT 43.6 01/19/2017   MCV 93.3 01/19/2017   PLT 290 01/19/2017      Chemistry      Component Value Date/Time   NA 139 01/19/2017 1121   K 4.4 01/19/2017 1121   CL 98 (L) 12/16/2016 0229   CO2 31 (H) 01/19/2017 1121   BUN 13.3 01/19/2017 1121   CREATININE 0.7 01/19/2017 1121      Component Value Date/Time   CALCIUM 9.6 01/19/2017 1121   ALKPHOS 97 01/19/2017 1121   AST 12 01/19/2017 1121   ALT 13 01/19/2017 1121   BILITOT 0.49 01/19/2017 1121       RADIOGRAPHIC STUDIES: Dg Chest 2 View  Result Date: 01/17/2017 CLINICAL DATA:  COPD, post VATS in April 2018, shortness of breath, former smoker EXAM: CHEST  2 VIEW COMPARISON:  12/17/2016 FINDINGS: Stable heart size. Atherosclerotic calcification aorta. Prominent AP window, suspect related to distortion of hilum and LEFT pulmonary artery resulting from previous LEFT upper lobe resection little changed. Scattered infiltrates persist in remaining LEFT lung. Underlying emphysematous changes in RIGHT lung, which remains clear. No pleural effusion or pneumothorax. Bones unremarkable. IMPRESSION: Postsurgical changes in the LEFT hemithorax from LEFT upper lobe resection with associated volume loss and prominence of AP window. Persistent LEFT lung infiltrate. Underlying emphysematous changes. Aortic atherosclerosis. Electronically Signed   By: Lavonia Dana M.D.   On: 01/17/2017 11:47    ASSESSMENT AND PLAN: This is a very pleasant 76 years old white male recently diagnosed with a stage IB  non-small cell lung cancer, squamous cell carcinoma presented with left upper lobe lung mass status post left upper lobectomy with lymph node dissection. The tumor size was 3.2 cm. There is no evidence for visceral pleural or lymphovascular invasion. I had a lengthy discussion with the patient his wife today about his current condition and treatment options. I explained to the patient that there is no benefit for adjuvant systemic chemotherapy for stage IB non-small cell lung cancer in the tumor size is less than 4.0 cm. I recommended for the patient to continue on observation for now with repeat CT scan of the chest in 6 months. For COPD, he will continue his current treatment with Singulair, Breo and albuterol inhaler. For hypertension, he was advised to monitor his blood pressure closely at home and report to his primary care physician and stayed elevated. The patient was advised to call immediately if he has any concerning symptoms in the interval. The patient voices understanding of current disease status and treatment options and is in agreement with the current care plan. All questions were answered. The patient knows to call the clinic with any problems, questions or concerns. We can certainly see the patient much sooner if necessary.  Disclaimer: This note was dictated with voice recognition software. Similar sounding words can inadvertently be transcribed and may  not be corrected upon review.

## 2017-01-31 DIAGNOSIS — G4733 Obstructive sleep apnea (adult) (pediatric): Secondary | ICD-10-CM | POA: Diagnosis not present

## 2017-01-31 DIAGNOSIS — I10 Essential (primary) hypertension: Secondary | ICD-10-CM | POA: Diagnosis not present

## 2017-01-31 DIAGNOSIS — Z6829 Body mass index (BMI) 29.0-29.9, adult: Secondary | ICD-10-CM | POA: Diagnosis not present

## 2017-01-31 DIAGNOSIS — C3490 Malignant neoplasm of unspecified part of unspecified bronchus or lung: Secondary | ICD-10-CM | POA: Diagnosis not present

## 2017-01-31 DIAGNOSIS — F3289 Other specified depressive episodes: Secondary | ICD-10-CM | POA: Diagnosis not present

## 2017-03-20 ENCOUNTER — Other Ambulatory Visit: Payer: Self-pay | Admitting: Thoracic Surgery (Cardiothoracic Vascular Surgery)

## 2017-03-20 DIAGNOSIS — C3490 Malignant neoplasm of unspecified part of unspecified bronchus or lung: Secondary | ICD-10-CM

## 2017-03-21 ENCOUNTER — Encounter: Payer: Self-pay | Admitting: Thoracic Surgery (Cardiothoracic Vascular Surgery)

## 2017-03-21 ENCOUNTER — Ambulatory Visit
Admission: RE | Admit: 2017-03-21 | Discharge: 2017-03-21 | Disposition: A | Discharge status: Home / Self Care | Payer: Medicare Other | Source: Ambulatory Visit | Attending: Thoracic Surgery (Cardiothoracic Vascular Surgery) | Admitting: Thoracic Surgery (Cardiothoracic Vascular Surgery)

## 2017-03-21 ENCOUNTER — Ambulatory Visit (INDEPENDENT_AMBULATORY_CARE_PROVIDER_SITE_OTHER): Payer: Medicare Other | Admitting: Thoracic Surgery (Cardiothoracic Vascular Surgery)

## 2017-03-21 VITALS — BP 128/70 | HR 66 | Resp 16 | Ht 66.0 in | Wt 175.6 lb

## 2017-03-21 DIAGNOSIS — C3490 Malignant neoplasm of unspecified part of unspecified bronchus or lung: Secondary | ICD-10-CM

## 2017-03-21 DIAGNOSIS — Z902 Acquired absence of lung [part of]: Secondary | ICD-10-CM

## 2017-03-21 DIAGNOSIS — C3432 Malignant neoplasm of lower lobe, left bronchus or lung: Secondary | ICD-10-CM

## 2017-03-21 NOTE — Progress Notes (Signed)
[address]       [place] [address]             [phone]     HPI: Mr. Pullman returns for a scheduled follow-up visit  He is a 76 year old man has a past history of tobacco abuse (quit April 2017) and COPD. He underwent a lingular sparing left upper lobectomy on 12/12/2016 for a stage IB squamous cell carcinoma. He went home on oxygen but now is back to his preoperative baseline of only using oxygen at night. He saw Dr. Julien Nordmann. He did not require any adjuvant therapy.  He feels well. He has occasional shooting or tingling pain in the area of his incision, but it tends to be very short-lived. He is not taking any pain medication. He gets short of breath with exertion but recently has been at the beach walking up and down 28 stairs. He says that after spending a couple weeks that he was able to do it without stopping. Past Medical History:  Diagnosis Date  . Anxiety    pt. reports that he " could swing from the ......" because he is so anxious about the surgery   . Arthritis    R hand tendonitis   . COPD (chronic obstructive pulmonary disease) ([place])   . Depression   . Hyperlipidemia   . Sleep apnea    cannot tolerate cpap     Current Outpatient Prescriptions  Medication Sig Dispense Refill  . albuterol (PROVENTIL HFA;VENTOLIN HFA) 108 (90 Base) MCG/ACT inhaler Inhale 2 puffs into the lungs every 6 (six) hours as needed for wheezing or shortness of breath.     Marland Kitchen BREO ELLIPTA 200-25 MCG/INH AEPB Inhale 1 Inhaler into the lungs daily.  2  . fexofenadine (ALLEGRA) 180 MG tablet Take 180 mg by mouth daily as needed for allergies or rhinitis.    Marland Kitchen montelukast (SINGULAIR) 10 MG tablet Take 10 mg by mouth at bedtime.    . sertraline (ZOLOFT) 50 MG tablet Take 1 tablet by mouth at bedtime.  5  . simvastatin (ZOCOR) 20 MG tablet Take 20 mg by mouth every evening.     No current facility-administered medications for this visit.     Physical Exam BP 128/70 (BP  Location: Right Arm, Patient Position: Sitting, Cuff Size: Large)   Pulse 66   Resp 16   Ht 5\' 6"  (1.676 m)   Wt 175 lb 9.6 oz (79.7 kg)   SpO2 94% Comment: ON RA  BMI 28.85 kg/m  76 year old man in no acute distress Alert and oriented 3 with no focal deficits No cervical or supraclavicular adenopathy Cardiac regular rate and rhythm normal S1 and S2 Lungs diminished at left base otherwise clear  Diagnostic Tests: CHEST  2 VIEW  COMPARISON:  01/17/2017  FINDINGS: The heart size is normal. There is aortic atherosclerosis. Postsurgical changes and volume loss from the left lung status post VATS and left upper lobectomy noted. The right lung appears clear. The visualized osseous structures are unremarkable.  IMPRESSION: 1. Stable appearance of the chest status post left upper lobectomy and left lung VATS.   Electronically Signed   By: Kerby Moors M.D.   On: 03/21/2017 10:17 I personally reviewed the chest x-ray and concur with the findings noted above  Impression: Mr. Reichardt is a 76 year old man who had a lingular sparing left upper lobectomy for a stage IB non-small cell carcinoma 3 months ago. He is doing extremely well at  this time, especially considering his preoperative pulmonary function testing. His preop FEV1 was only 0.9 and his baseline diffusion capacity was 41% predicted.  Smoking cessation instruction/counseling given:  commended patient for quitting and reviewed strategies for preventing relapses   Dr. Inda Merlin will do his follow-up CT scanning.  Plan: I will plan to see Mr. Vandervelden back in 9 months for one year follow-up visit.  [name], MD [facility] [phone]

## 2017-05-27 DIAGNOSIS — Z23 Encounter for immunization: Secondary | ICD-10-CM | POA: Diagnosis not present

## 2017-06-14 ENCOUNTER — Telehealth: Payer: Self-pay

## 2017-06-14 NOTE — Telephone Encounter (Signed)
Patient called to check on appointment time for December. Per 10/31 phone que

## 2017-07-14 DIAGNOSIS — R82998 Other abnormal findings in urine: Secondary | ICD-10-CM | POA: Diagnosis not present

## 2017-07-14 DIAGNOSIS — Z125 Encounter for screening for malignant neoplasm of prostate: Secondary | ICD-10-CM | POA: Diagnosis not present

## 2017-07-14 DIAGNOSIS — I10 Essential (primary) hypertension: Secondary | ICD-10-CM | POA: Diagnosis not present

## 2017-07-14 DIAGNOSIS — E7849 Other hyperlipidemia: Secondary | ICD-10-CM | POA: Diagnosis not present

## 2017-07-14 DIAGNOSIS — E0590 Thyrotoxicosis, unspecified without thyrotoxic crisis or storm: Secondary | ICD-10-CM | POA: Diagnosis not present

## 2017-07-17 ENCOUNTER — Ambulatory Visit (HOSPITAL_COMMUNITY)
Admission: RE | Admit: 2017-07-17 | Discharge: 2017-07-17 | Disposition: A | Discharge status: Home / Self Care | Payer: Medicare Other | Source: Ambulatory Visit | Attending: Internal Medicine | Admitting: Internal Medicine

## 2017-07-17 ENCOUNTER — Other Ambulatory Visit (HOSPITAL_BASED_OUTPATIENT_CLINIC_OR_DEPARTMENT_OTHER): Payer: Medicare Other

## 2017-07-17 DIAGNOSIS — C3492 Malignant neoplasm of unspecified part of left bronchus or lung: Secondary | ICD-10-CM

## 2017-07-17 DIAGNOSIS — C3490 Malignant neoplasm of unspecified part of unspecified bronchus or lung: Secondary | ICD-10-CM | POA: Diagnosis not present

## 2017-07-17 DIAGNOSIS — J90 Pleural effusion, not elsewhere classified: Secondary | ICD-10-CM | POA: Diagnosis not present

## 2017-07-17 DIAGNOSIS — K7689 Other specified diseases of liver: Secondary | ICD-10-CM | POA: Diagnosis not present

## 2017-07-17 DIAGNOSIS — E041 Nontoxic single thyroid nodule: Secondary | ICD-10-CM | POA: Diagnosis not present

## 2017-07-17 DIAGNOSIS — C3412 Malignant neoplasm of upper lobe, left bronchus or lung: Secondary | ICD-10-CM | POA: Diagnosis present

## 2017-07-17 DIAGNOSIS — R918 Other nonspecific abnormal finding of lung field: Secondary | ICD-10-CM | POA: Diagnosis not present

## 2017-07-17 LAB — CBC WITH DIFFERENTIAL/PLATELET
BASO%: 0.5 % (ref 0.0–2.0)
BASOS ABS: 0 10*3/uL (ref 0.0–0.1)
EOS%: 3.7 % (ref 0.0–7.0)
Eosinophils Absolute: 0.3 10*3/uL (ref 0.0–0.5)
HEMATOCRIT: 43.6 % (ref 38.4–49.9)
HGB: 14.9 g/dL (ref 13.0–17.1)
LYMPH%: 10.5 % — ABNORMAL LOW (ref 14.0–49.0)
MCH: 32.1 pg (ref 27.2–33.4)
MCHC: 34.2 g/dL (ref 32.0–36.0)
MCV: 93.8 fL (ref 79.3–98.0)
MONO#: 1.4 10*3/uL — ABNORMAL HIGH (ref 0.1–0.9)
MONO%: 15.4 % — AB (ref 0.0–14.0)
NEUT#: 6.2 10*3/uL (ref 1.5–6.5)
NEUT%: 69.9 % (ref 39.0–75.0)
Platelets: 282 10*3/uL (ref 140–400)
RBC: 4.65 10*6/uL (ref 4.20–5.82)
RDW: 14.3 % (ref 11.0–14.6)
WBC: 8.8 10*3/uL (ref 4.0–10.3)
lymph#: 0.9 10*3/uL (ref 0.9–3.3)

## 2017-07-17 LAB — COMPREHENSIVE METABOLIC PANEL
ALT: 22 U/L (ref 0–55)
AST: 16 U/L (ref 5–34)
Albumin: 3.5 g/dL (ref 3.5–5.0)
Alkaline Phosphatase: 81 U/L (ref 40–150)
Anion Gap: 6 mEq/L (ref 3–11)
BUN: 14.8 mg/dL (ref 7.0–26.0)
CALCIUM: 9.3 mg/dL (ref 8.4–10.4)
CHLORIDE: 104 meq/L (ref 98–109)
CO2: 29 mEq/L (ref 22–29)
Creatinine: 0.8 mg/dL (ref 0.7–1.3)
EGFR: >60 mL/min/{1.73_m2} (ref >60)
Glucose: 92 mg/dl (ref 70–140)
POTASSIUM: 4.4 meq/L (ref 3.5–5.1)
Sodium: 139 mEq/L (ref 136–145)
Total Bilirubin: 0.55 mg/dL (ref 0.20–1.20)
Total Protein: 6.8 g/dL (ref 6.4–8.3)

## 2017-07-17 MED ORDER — IOPAMIDOL (ISOVUE-300) INJECTION 61%
75.0000 mL | Freq: Once | INTRAVENOUS | Status: AC | PRN
  Administered 2017-07-17: 75 mL via INTRAVENOUS

## 2017-07-17 MED ORDER — IOPAMIDOL (ISOVUE-300) INJECTION 61%
INTRAVENOUS | Status: AC
  Filled 2017-07-17: qty 75

## 2017-07-19 ENCOUNTER — Telehealth: Payer: Self-pay | Admitting: Internal Medicine

## 2017-07-19 ENCOUNTER — Ambulatory Visit (HOSPITAL_BASED_OUTPATIENT_CLINIC_OR_DEPARTMENT_OTHER): Payer: Medicare Other | Admitting: Internal Medicine

## 2017-07-19 ENCOUNTER — Encounter: Payer: Self-pay | Admitting: Internal Medicine

## 2017-07-19 DIAGNOSIS — J449 Chronic obstructive pulmonary disease, unspecified: Secondary | ICD-10-CM | POA: Diagnosis not present

## 2017-07-19 DIAGNOSIS — Z85118 Personal history of other malignant neoplasm of bronchus and lung: Secondary | ICD-10-CM | POA: Diagnosis not present

## 2017-07-19 DIAGNOSIS — I10 Essential (primary) hypertension: Secondary | ICD-10-CM

## 2017-07-19 DIAGNOSIS — C3490 Malignant neoplasm of unspecified part of unspecified bronchus or lung: Secondary | ICD-10-CM

## 2017-07-19 NOTE — Progress Notes (Signed)
[name] [phone]   Fax:(336) [phone]  OFFICE PROGRESS NOTE  [name], MD [place] [address]  DIAGNOSIS: Stage IB (T2a, N0, M0) non-small cell lung cancer, poorly differentiated invasive squamous cell carcinoma diagnosed in April 2018  PRIOR THERAPY: Status post left upper lobectomy with lymph node dissection under the care of Dr. Roxan Hockey on 12/12/2016 with tumor size of 3.2 cm.  CURRENT THERAPY: Observation.  INTERVAL HISTORY: [name] 76 y.o. male returns to the clinic today for follow-up visit.  The patient is feeling fine today with no specific complaints except for shortness of breath with exertion.  He gained around 9 pounds since his last visit.  He denied having any chest pain, cough or hemoptysis.  He denied having any fever or chills.  He has no nausea, vomiting, diarrhea or constipation.  The patient had repeat CT scan of the chest performed recently and he is here for evaluation and discussion of his discuss results.    MEDICAL HISTORY: Past Medical History:  Diagnosis Date  . Anxiety    pt. reports that he " could swing from the ......" because he is so anxious about the surgery   . Arthritis    R hand tendonitis   . COPD (chronic obstructive pulmonary disease) ([place])   . Depression   . Hyperlipidemia   . Sleep apnea    cannot tolerate cpap    ALLERGIES:  is allergic to nsaids; gadolinium derivatives; and penicillins.  MEDICATIONS:  Current Outpatient Medications  Medication Sig Dispense Refill  . albuterol (PROVENTIL HFA;VENTOLIN HFA) 108 (90 Base) MCG/ACT inhaler Inhale 2 puffs into the lungs every 6 (six) hours as needed for wheezing or shortness of breath.     Marland Kitchen BREO ELLIPTA 200-25 MCG/INH AEPB Inhale 1 Inhaler into the lungs daily.  2  . fexofenadine (ALLEGRA) 180 MG tablet Take 180 mg by mouth daily as needed for allergies or rhinitis.    Marland Kitchen montelukast (SINGULAIR) 10 MG tablet Take  10 mg by mouth at bedtime.    . sertraline (ZOLOFT) 50 MG tablet Take 1 tablet by mouth at bedtime.  5  . simvastatin (ZOCOR) 20 MG tablet Take 20 mg by mouth every evening.     No current facility-administered medications for this visit.     SURGICAL HISTORY:  Past Surgical History:  Procedure Laterality Date  . COLONOSCOPY W/ POLYPECTOMY    . cyst back  2011  . CYSTOSCOPY  2012   for bleeding in bladder x 3  . MOUTH SURGERY    . VIDEO ASSISTED THORACOSCOPY (VATS)/ LOBECTOMY Left 12/12/2016   Procedure: VIDEO ASSISTED THORACOSCOPY (VATS)/LEFT UPPER LOBECTOMY;  Surgeon: [name], MD;  Location: [facility];  Service: Thoracic;  Laterality: Left;    REVIEW OF SYSTEMS:  A comprehensive review of systems was negative except for: Respiratory: positive for dyspnea on exertion   PHYSICAL EXAMINATION: General appearance: alert, cooperative and no distress Head: Normocephalic, without obvious abnormality, atraumatic Neck: no adenopathy, no JVD, supple, symmetrical, trachea midline and thyroid not enlarged, symmetric, no tenderness/mass/nodules Lymph nodes: Cervical, supraclavicular, and axillary nodes normal. Resp: clear to auscultation bilaterally Back: symmetric, no curvature. ROM normal. No CVA tenderness. Cardio: regular rate and rhythm, S1, S2 normal, no murmur, click, rub or gallop GI: soft, non-tender; bowel sounds normal; no masses,  no organomegaly Extremities: extremities normal, atraumatic, no cyanosis or edema  ECOG PERFORMANCE STATUS: 1 - Symptomatic but completely ambulatory  Blood pressure 136/75, pulse 84, temperature 97.6 F (36.4 C), temperature source Oral, resp. rate 18, height 5\' 6"  (1.676 m), weight 185 lb 11.2 oz (84.2 kg), SpO2 94 %.  LABORATORY DATA: Lab Results  Component Value Date   WBC 8.8 07/17/2017   HGB 14.9 07/17/2017   HCT 43.6 07/17/2017   MCV 93.8 07/17/2017   PLT 282 07/17/2017      Chemistry      Component Value Date/Time   NA 139  07/17/2017 1049   K 4.4 07/17/2017 1049   CL 98 (L) 12/16/2016 0229   CO2 29 07/17/2017 1049   BUN 14.8 07/17/2017 1049   CREATININE 0.8 07/17/2017 1049      Component Value Date/Time   CALCIUM 9.3 07/17/2017 1049   ALKPHOS 81 07/17/2017 1049   AST 16 07/17/2017 1049   ALT 22 07/17/2017 1049   BILITOT 0.55 07/17/2017 1049       RADIOGRAPHIC STUDIES: Ct Chest W Contrast  Result Date: 07/17/2017 CLINICAL DATA:  Lung cancer post surgery. Bloating and upper abdominal pain with shortness of breath on exertion. EXAM: CT CHEST WITH CONTRAST TECHNIQUE: Multidetector CT imaging of the chest was performed during intravenous contrast administration. CONTRAST:  87mL ISOVUE-300 IOPAMIDOL (ISOVUE-300) INJECTION 61% COMPARISON:  Chest CT 11/02/2016. PET-CT 11/24/2016. Chest radiographs 03/21/2017. FINDINGS: Cardiovascular: There is atherosclerosis of the aorta, great vessels and coronary arteries. No acute vascular findings are seen. The heart size is normal. There is no pericardial effusion. Mediastinum/Nodes: There are no enlarged mediastinal, hilar or axillary lymph nodes.Small mediastinal lymph nodes are unchanged. Heterogeneous right thyroid nodule is grossly stable, measuring up to 2.5 cm on image 34. The trachea and esophagus demonstrate no significant findings. Lungs/Pleura: Patient has undergone interval wedge resection of the left upper lobe mass. There is a small amount of loculated pleural fluid posteriorly in the lower left hemithorax. There is no pneumothorax. There is some scarring and atelectasis in the lingula and left lower lobe. In the superior segment of the left lower lobe, there is a 6 x 6 mm nodule on image 52 which is unchanged (image 52 on the previous study). There are scattered small subpleural right lower lobe nodules which are also stable (images 87, 108 and 109). Small subpleural nodule along the anterior aspect of the right major fissure on image 66 may be slightly larger. Upper  abdomen: There are multiple hypervascular lesions within the liver, measuring up to 1.9 cm in the right hepatic lobe (image 163). These are not evident on the prior noncontrast chest CT. In review of an enhanced abdominal CT performed 04/19/2011, at least some of these were probably present. Comparison is limited by technical differences. No adrenal mass. Musculoskeletal/Chest wall: There is no chest wall mass or suspicious osseous finding. IMPRESSION: 1. Interval wedge resection of the dominant left upper lobe mass. There is mild scarring or atelectasis in the left lung with a small loculated left pleural effusion. 2. Scattered small pulmonary nodules bilaterally have not significantly changed, largest in the superior segment of the left lower lobe. These are likely benign. 3. No adenopathy. 4. Multiple hypervascular hepatic lesions, some of which were probably present on 2012 CT, suggesting a benign etiology such as hemangiomas. These are incompletely characterized by the current study but unlikely to reflect metastatic squamous cell carcinoma. These could be further characterized with hepatic MRI if clinically warranted. Alternatively, continued CT follow-up could be performed. 5. Grossly stable heterogeneous right thyroid nodule. Consider further evaluation with thyroid ultrasound. If  patient is clinically hyperthyroid, consider nuclear medicine thyroid uptake and scan. Electronically Signed   By: Richardean Sale M.D.   On: 07/17/2017 16:59    ASSESSMENT AND PLAN: This is a very pleasant 76 years old white male recently diagnosed with a stage IB non-small cell lung cancer, squamous cell carcinoma presented with left upper lobe lung mass status post left upper lobectomy with lymph node dissection. The tumor size was 3.2 cm. There is no evidence for visceral pleural or lymphovascular invasion. He is current on observation. Repeat CT scan of the chest showed no evidence for disease recurrence. I discussed  the scan results with the patient and recommended for him to continue on observation with repeat CT scan of the chest in 6 months. He was advised to call immediately if he has any concerning symptoms in the interval. For COPD, he will continue his current treatment with Singulair, Breo and albuterol inhaler. For hypertension, he was advised to monitor his blood pressure closely at home and report to his primary care physician and stayed elevated. The patient voices understanding of current disease status and treatment options and is in agreement with the current care plan. All questions were answered. The patient knows to call the clinic with any problems, questions or concerns. We can certainly see the patient much sooner if necessary.  Disclaimer: This note was dictated with voice recognition software. Similar sounding words can inadvertently be transcribed and may not be corrected upon review.

## 2017-07-19 NOTE — Telephone Encounter (Signed)
Scheduled appt per 12/5 los - Gave patient AVS and calender per los. Central radiology to contact patient with ct schedule.

## 2017-07-21 DIAGNOSIS — I10 Essential (primary) hypertension: Secondary | ICD-10-CM | POA: Diagnosis not present

## 2017-07-21 DIAGNOSIS — Z6830 Body mass index (BMI) 30.0-30.9, adult: Secondary | ICD-10-CM | POA: Diagnosis not present

## 2017-07-21 DIAGNOSIS — F3289 Other specified depressive episodes: Secondary | ICD-10-CM | POA: Diagnosis not present

## 2017-07-21 DIAGNOSIS — R0901 Asphyxia: Secondary | ICD-10-CM | POA: Diagnosis not present

## 2017-07-21 DIAGNOSIS — Z0000 Encounter for general adult medical examination without abnormal findings: Secondary | ICD-10-CM | POA: Diagnosis not present

## 2017-07-21 DIAGNOSIS — E0590 Thyrotoxicosis, unspecified without thyrotoxic crisis or storm: Secondary | ICD-10-CM | POA: Diagnosis not present

## 2017-07-21 DIAGNOSIS — G4733 Obstructive sleep apnea (adult) (pediatric): Secondary | ICD-10-CM | POA: Diagnosis not present

## 2017-07-21 DIAGNOSIS — K635 Polyp of colon: Secondary | ICD-10-CM | POA: Diagnosis not present

## 2017-07-21 DIAGNOSIS — C3490 Malignant neoplasm of unspecified part of unspecified bronchus or lung: Secondary | ICD-10-CM | POA: Diagnosis not present

## 2017-07-21 DIAGNOSIS — I4510 Unspecified right bundle-branch block: Secondary | ICD-10-CM | POA: Diagnosis not present

## 2017-07-21 DIAGNOSIS — K219 Gastro-esophageal reflux disease without esophagitis: Secondary | ICD-10-CM | POA: Diagnosis not present

## 2017-09-06 DIAGNOSIS — H524 Presbyopia: Secondary | ICD-10-CM | POA: Diagnosis not present

## 2017-09-06 DIAGNOSIS — H2513 Age-related nuclear cataract, bilateral: Secondary | ICD-10-CM | POA: Diagnosis not present

## 2017-12-19 ENCOUNTER — Encounter: Payer: Medicare Other | Admitting: Thoracic Surgery (Cardiothoracic Vascular Surgery)

## 2017-12-25 ENCOUNTER — Other Ambulatory Visit: Payer: Self-pay | Admitting: Thoracic Surgery (Cardiothoracic Vascular Surgery)

## 2017-12-25 DIAGNOSIS — C3490 Malignant neoplasm of unspecified part of unspecified bronchus or lung: Secondary | ICD-10-CM

## 2017-12-26 ENCOUNTER — Ambulatory Visit (INDEPENDENT_AMBULATORY_CARE_PROVIDER_SITE_OTHER): Payer: Medicare Other | Admitting: Thoracic Surgery (Cardiothoracic Vascular Surgery)

## 2017-12-26 ENCOUNTER — Ambulatory Visit
Admission: RE | Admit: 2017-12-26 | Discharge: 2017-12-26 | Disposition: A | Discharge status: Home / Self Care | Payer: Medicare Other | Source: Ambulatory Visit | Attending: Thoracic Surgery (Cardiothoracic Vascular Surgery) | Admitting: Thoracic Surgery (Cardiothoracic Vascular Surgery)

## 2017-12-26 ENCOUNTER — Encounter: Payer: Self-pay | Admitting: Thoracic Surgery (Cardiothoracic Vascular Surgery)

## 2017-12-26 VITALS — BP 137/75 | HR 76 | Resp 20 | Ht 66.0 in | Wt 192.0 lb

## 2017-12-26 DIAGNOSIS — R05 Cough: Secondary | ICD-10-CM | POA: Diagnosis not present

## 2017-12-26 DIAGNOSIS — C3490 Malignant neoplasm of unspecified part of unspecified bronchus or lung: Secondary | ICD-10-CM

## 2017-12-26 DIAGNOSIS — C3432 Malignant neoplasm of lower lobe, left bronchus or lung: Secondary | ICD-10-CM

## 2017-12-26 DIAGNOSIS — Z902 Acquired absence of lung [part of]: Secondary | ICD-10-CM | POA: Diagnosis not present

## 2017-12-26 IMAGING — CR DG CHEST 1V PORT
1 series · 1 of 1 positions shown · non-contrast
Comparison: Portable chest x-ray December 12, 2016.

CLINICAL DATA: Status post video-assisted thoracic surgery for a
left upper lobe mass.

EXAM:
PORTABLE CHEST 1 VIEW

[AP]
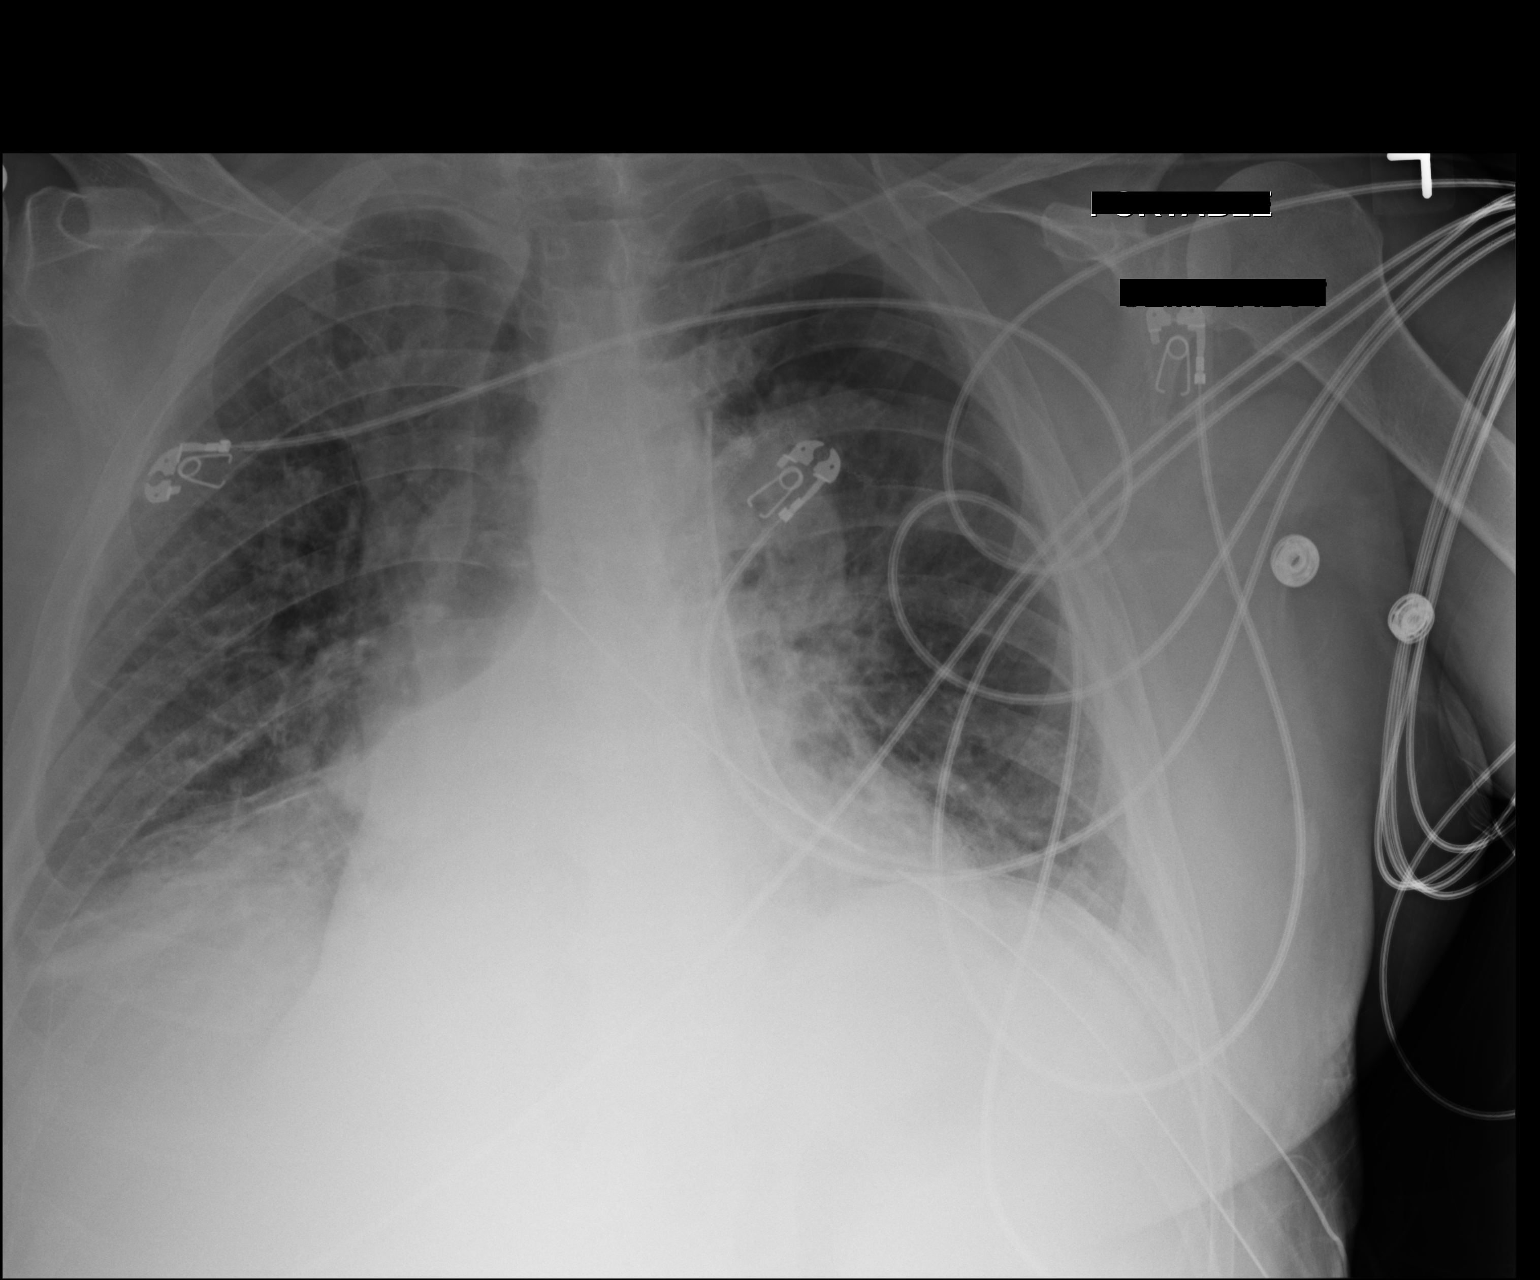

[1 of 1 positions shown; findings below may reference images not displayed]

FINDINGS: The lungs are less well inflated today. 2 chest tubes remain present
on the left. There is no definite pneumothorax. There are
postsurgical changes in the left perihilar region with stable
curvilinear density just above a cardiac monitoring lead on the
left. There is patchy density at the right lung base likely
reflecting atelectasis. The cardiac silhouette remains enlarged. The
pulmonary vascularity is indistinct. There is no significant pleural
effusion.
IMPRESSION: Lung markings are visible in the left upper hemithorax above a
curvilinear density that is felt reflect post surgical change. No
definite pneumothorax is observed. There is mild pulmonary
interstitial edema. Mild right basilar subsegmental atelectasis is
suspected.

## 2017-12-26 NOTE — Progress Notes (Signed)
[address]       [place] [address]             [phone]    HPI: [name] returns for a scheduled one-year follow-up visit.  [name] is a 77 year old gentleman with a history of tobacco abuse, COPD, depression, hyperlipidemia, sleep apnea, arthritis, and anxiety.  He had a lingular sparing left upper lobectomy for a stage IB squamous cell carcinoma in April 2018.  He did not require adjuvant therapy.  I last saw him in the office in August 2018.  He was doing well at that time.  He saw Dr. Julien Butler in December.  His CT showed no evidence of recurrent disease.  Overall he is feeling well.  His appetite is good.  He has not had any significant weight loss.  He has occasional "pins-and-needles" pain related to his incision.  He says it is only very brief instances and he does not even have time to take anything for the pain before it resolves.  He says he is felt his heart fluttering a couple times.  Has been working with his primary care on that.  He has not smoked since his surgery.  Past Medical History:  Diagnosis Date  . Anxiety    pt. reports that he " could swing from the ......" because he is so anxious about the surgery   . Arthritis    R hand tendonitis   . COPD (chronic obstructive pulmonary disease) ([place])   . Depression   . Hyperlipidemia   . Sleep apnea    cannot tolerate cpap    Current Outpatient Medications  Medication Sig Dispense Refill  . albuterol (PROVENTIL HFA;VENTOLIN HFA) 108 (90 Base) MCG/ACT inhaler Inhale 2 puffs into the lungs every 6 (six) hours as needed for wheezing or shortness of breath.     Marland Kitchen BREO ELLIPTA 200-25 MCG/INH AEPB Inhale 1 Inhaler into the lungs daily.  2  . fexofenadine (ALLEGRA) 180 MG tablet Take 180 mg by mouth daily as needed for allergies or rhinitis.    Marland Kitchen montelukast (SINGULAIR) 10 MG tablet Take 10 mg by mouth at bedtime.    . sertraline (ZOLOFT) 50 MG tablet Take 1 tablet by mouth at bedtime.  5  .  simvastatin (ZOCOR) 20 MG tablet Take 20 mg by mouth every evening.     No current facility-administered medications for this visit.     Physical Exam BP 137/75   Pulse 76   Resp 20   Ht 5\' 6"  (1.676 m)   Wt 192 lb (87.1 kg)   SpO2 95% Comment: RA  BMI 30.41 kg/m  77 year old man in no acute distress Alert and oriented x3 with no focal deficits No cervical or supra clavicular adenopathy Cardiac regular rate and rhythm normal S1 and S2 Lungs clear with equal breath sounds bilaterally Incisions well-healed  Diagnostic Tests: CHEST - 2 VIEW  COMPARISON:  Chest radiograph March 21, 2017 and chest CT July 17, 2017  FINDINGS: There is scarring with volume loss on the left. There is currently no demonstrable mass. No edema or consolidation. Heart size and pulmonary vascularity are normal. No adenopathy. There is aortic atherosclerosis. There is mild degenerative change in the thoracic spine.  IMPRESSION: Postoperative change on the left with scarring and volume loss. No edema or consolidation. No mass evident. No adenopathy. Heart size normal. There is aortic atherosclerosis.  Aortic Atherosclerosis (ICD10-I70.0).   Electronically Signed   By: David Saxon  Butler December III M.D.   On: 12/26/2017 11:40 I personally reviewed his chest x-ray images and concur with the findings noted above  Impression: Mr. Shock is a 77 year old gentleman with a history of tobacco abuse who had a lingular sparing left upper lobectomy for a stage IB squamous cell carcinoma a year ago.  He did not require adjuvant therapy as it was a relatively small tumor.  He is now a year out from surgery.  He has occasional, short-lived paresthesias.  He does not need any medication for that.  He continues to follow with Dr. Julien Butler.  He is scheduled to have a CT scan and see him in June.  Heart palpitations-working with primary care  History of tobacco abuse-quit a year ago. Smoking cessation  instruction/counseling given:  commended patient for quitting and reviewed strategies for preventing relapses  Plan: He will continue to follow-up with Dr. Julien Butler  I will be happy to see him back anytime the future if I can be of any further assistance with his care  [name], MD [facility] [phone]

## 2017-12-29 IMAGING — CR DG CHEST 1V PORT
1 series · 1 of 1 positions shown · non-contrast
Comparison: 12/15/2016 and earlier.

CLINICAL DATA: 75-year-old male status post VATS, left upper
lobectomy 12/12/2016 (pathology revealing invasive poorly
differentiated squamous cell carcinoma).

EXAM:
PORTABLE CHEST 1 VIEW

[AP]
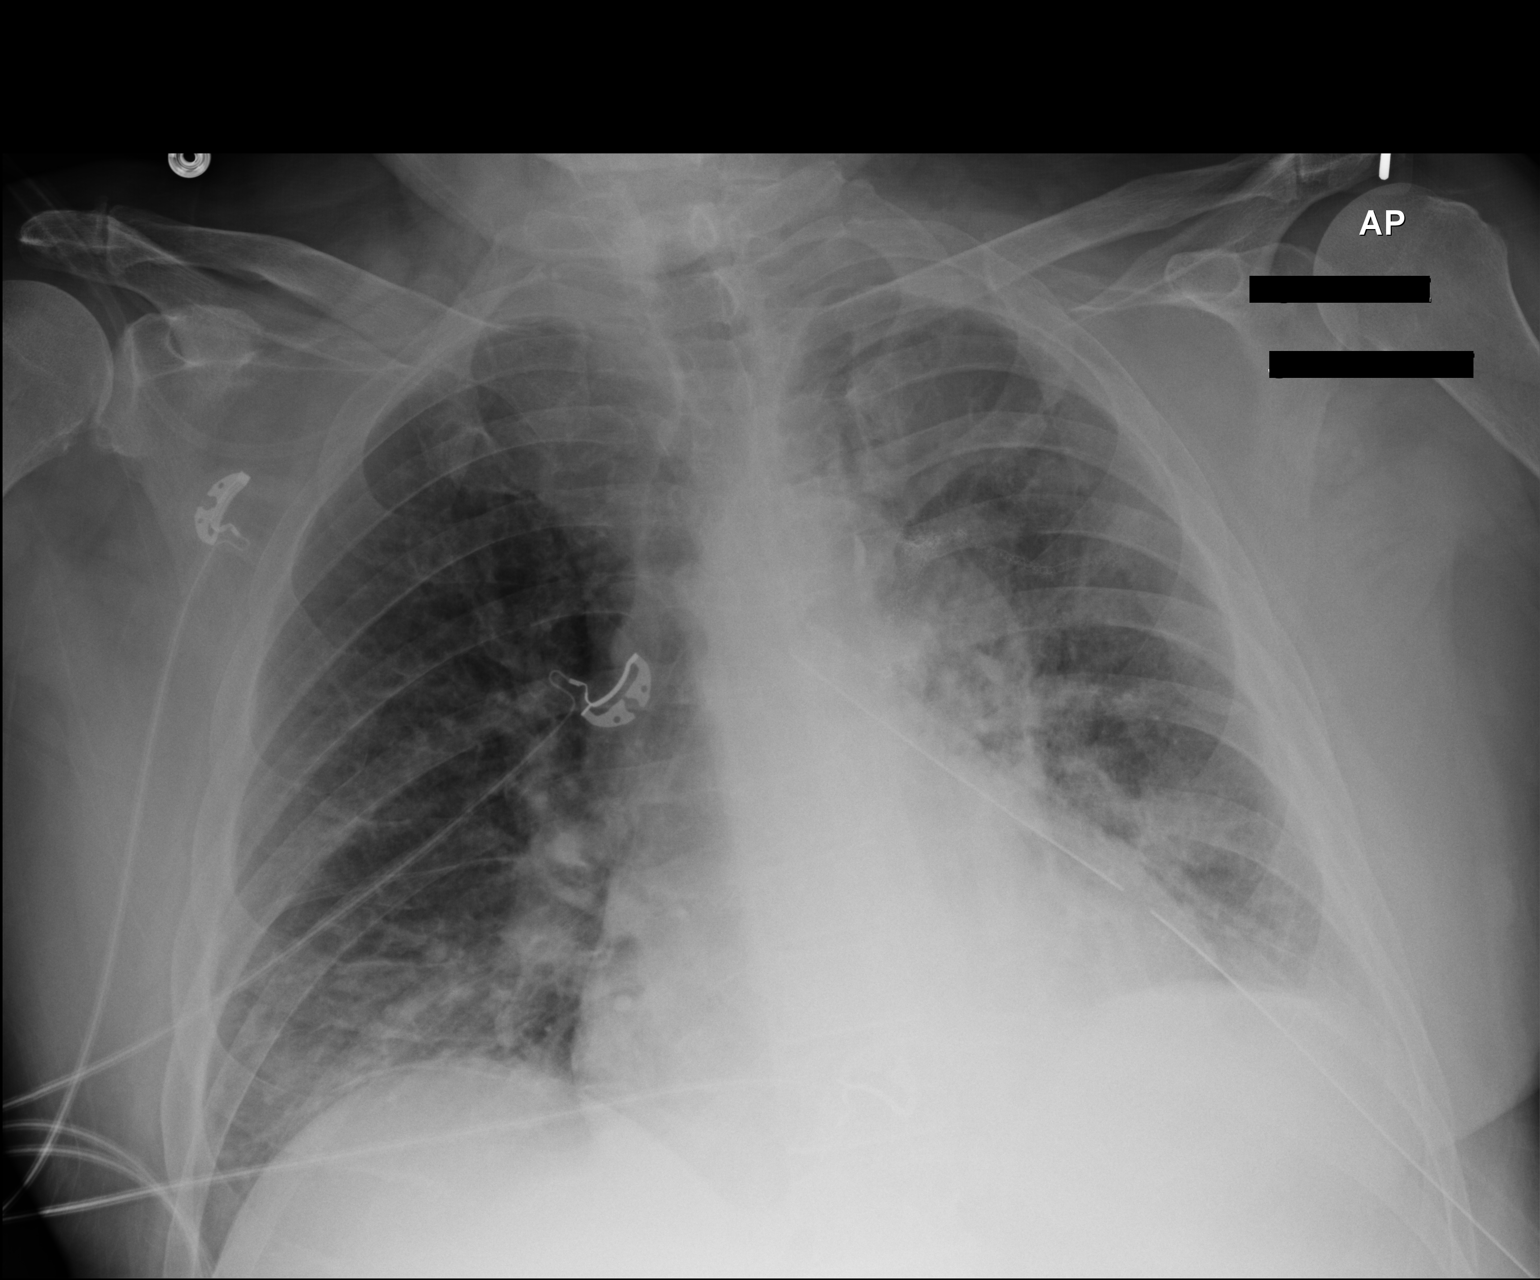

[1 of 1 positions shown; findings below may reference images not displayed]

FINDINGS: Portable AP semi upright view at 0601 hours. Left chest tube remains
in place. Mildly larger lung volumes. No pneumothorax. Probable
small volume of left apical pleural fluid. Stable cardiac size and
mediastinal contours. Staple line above the left hilum. Mild streaky
opacity at the right lung base is stable and most resembles
atelectasis.
IMPRESSION: 1. Stable left chest tube.  No pneumothorax.
2. Mild atelectasis.

## 2017-12-29 IMAGING — DX DG CHEST 1V SAME DAY
1 series · 1 of 1 positions shown · non-contrast
Comparison: Single-view of the chest earlier today and 12/15/2016.

CLINICAL DATA: Status post left chest tube removal today. The
patient underwent left upper lobectomy 12/12/2016.

EXAM:
CHEST - 1 VIEW SAME DAY

[chest ap]
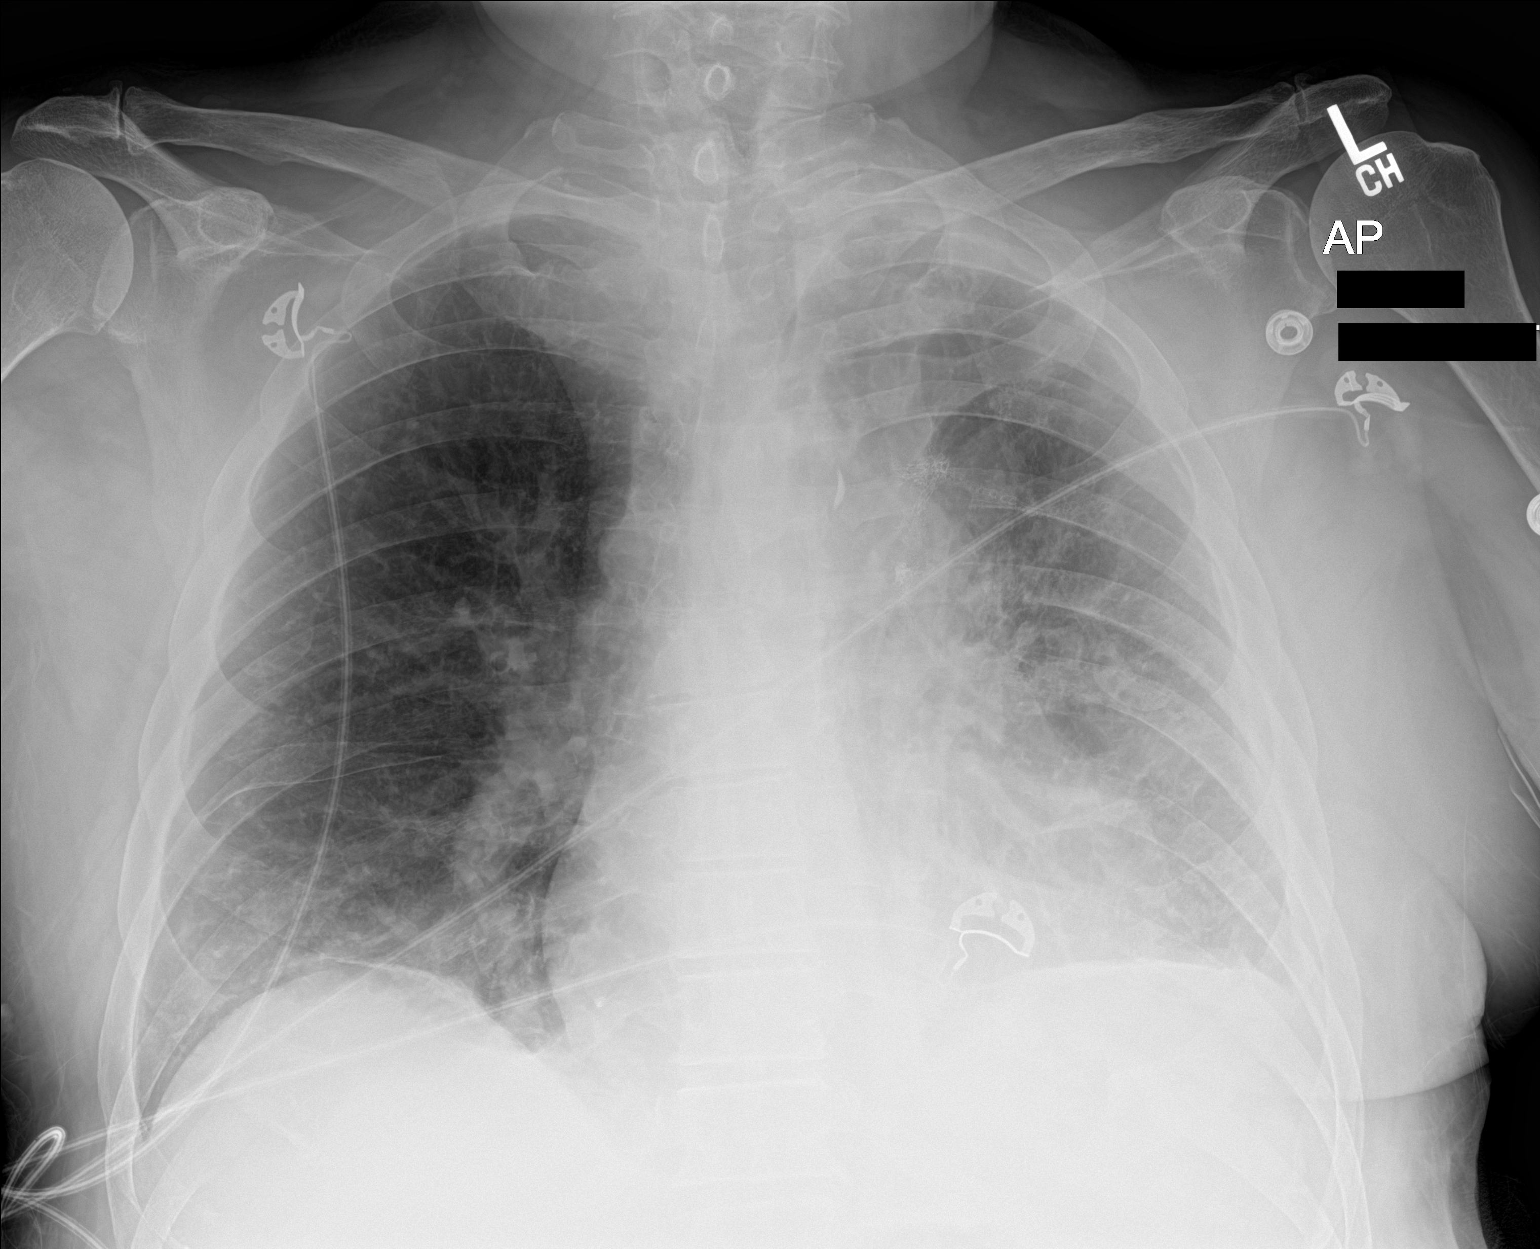

[1 of 1 positions shown; findings below may reference images not displayed]

FINDINGS: Left chest tube has been removed. No pneumothorax is identified.
Volume loss in the left chest consistent with left upper lobectomy
is again seen. Basilar atelectasis is noted.
IMPRESSION: Negative for pneumothorax after left chest removal. No new
abnormality.

## 2018-01-15 ENCOUNTER — Inpatient Hospital Stay: Payer: Medicare Other | Attending: Internal Medicine

## 2018-01-15 DIAGNOSIS — Z85118 Personal history of other malignant neoplasm of bronchus and lung: Secondary | ICD-10-CM | POA: Diagnosis not present

## 2018-01-15 DIAGNOSIS — J449 Chronic obstructive pulmonary disease, unspecified: Secondary | ICD-10-CM | POA: Diagnosis not present

## 2018-01-15 DIAGNOSIS — C3490 Malignant neoplasm of unspecified part of unspecified bronchus or lung: Secondary | ICD-10-CM

## 2018-01-15 DIAGNOSIS — I10 Essential (primary) hypertension: Secondary | ICD-10-CM | POA: Insufficient documentation

## 2018-01-15 LAB — COMPREHENSIVE METABOLIC PANEL
ALT: 17 U/L (ref 0–55)
AST: 16 U/L (ref 5–34)
Albumin: 3.7 g/dL (ref 3.5–5.0)
Alkaline Phosphatase: 75 U/L (ref 40–150)
Anion gap: 7 (ref 3–11)
BILIRUBIN TOTAL: 0.7 mg/dL (ref 0.2–1.2)
BUN: 17 mg/dL (ref 7–26)
CALCIUM: 9.3 mg/dL (ref 8.4–10.4)
CHLORIDE: 103 mmol/L (ref 98–109)
CO2: 28 mmol/L (ref 22–29)
CREATININE: 0.8 mg/dL (ref 0.70–1.30)
GFR calc non Af Amer: >60 mL/min (ref >60)
Glucose, Bld: 99 mg/dL (ref 70–140)
Potassium: 4.4 mmol/L (ref 3.5–5.1)
Sodium: 138 mmol/L (ref 136–145)
TOTAL PROTEIN: 6.9 g/dL (ref 6.4–8.3)

## 2018-01-15 LAB — CBC WITH DIFFERENTIAL/PLATELET
BASOS ABS: 0 10*3/uL (ref 0.0–0.1)
BASOS PCT: 0 %
EOS ABS: 0.2 10*3/uL (ref 0.0–0.5)
EOS PCT: 2 %
HCT: 44.7 % (ref 38.4–49.9)
HEMOGLOBIN: 15.1 g/dL (ref 13.0–17.1)
LYMPHS ABS: 1 10*3/uL (ref 0.9–3.3)
Lymphocytes Relative: 13 %
MCH: 32.2 pg (ref 27.2–33.4)
MCHC: 33.8 g/dL (ref 32.0–36.0)
MCV: 95.3 fL (ref 79.3–98.0)
Monocytes Absolute: 1.2 10*3/uL — ABNORMAL HIGH (ref 0.1–0.9)
Monocytes Relative: 14 %
NEUTROS PCT: 71 %
Neutro Abs: 5.8 10*3/uL (ref 1.5–6.5)
PLATELETS: 253 10*3/uL (ref 140–400)
RBC: 4.69 MIL/uL (ref 4.20–5.82)
RDW: 13.7 % (ref 11.0–14.6)
WBC: 8.2 10*3/uL (ref 4.0–10.3)

## 2018-01-16 ENCOUNTER — Encounter (HOSPITAL_COMMUNITY): Payer: Self-pay

## 2018-01-16 ENCOUNTER — Ambulatory Visit (HOSPITAL_COMMUNITY)
Admission: RE | Admit: 2018-01-16 | Discharge: 2018-01-16 | Disposition: A | Discharge status: Home / Self Care | Payer: Medicare Other | Source: Ambulatory Visit | Attending: Internal Medicine | Admitting: Internal Medicine

## 2018-01-16 DIAGNOSIS — I700 Atherosclerosis of aorta: Secondary | ICD-10-CM | POA: Insufficient documentation

## 2018-01-16 DIAGNOSIS — E041 Nontoxic single thyroid nodule: Secondary | ICD-10-CM | POA: Insufficient documentation

## 2018-01-16 DIAGNOSIS — I2510 Atherosclerotic heart disease of native coronary artery without angina pectoris: Secondary | ICD-10-CM | POA: Diagnosis not present

## 2018-01-16 DIAGNOSIS — N200 Calculus of kidney: Secondary | ICD-10-CM | POA: Diagnosis not present

## 2018-01-16 DIAGNOSIS — R599 Enlarged lymph nodes, unspecified: Secondary | ICD-10-CM | POA: Diagnosis not present

## 2018-01-16 DIAGNOSIS — C3490 Malignant neoplasm of unspecified part of unspecified bronchus or lung: Secondary | ICD-10-CM | POA: Diagnosis present

## 2018-01-16 DIAGNOSIS — J439 Emphysema, unspecified: Secondary | ICD-10-CM | POA: Insufficient documentation

## 2018-01-16 DIAGNOSIS — I2721 Secondary pulmonary arterial hypertension: Secondary | ICD-10-CM | POA: Insufficient documentation

## 2018-01-16 DIAGNOSIS — C3492 Malignant neoplasm of unspecified part of left bronchus or lung: Secondary | ICD-10-CM | POA: Diagnosis not present

## 2018-01-16 MED ORDER — IOHEXOL 300 MG/ML  SOLN
75.0000 mL | Freq: Once | INTRAMUSCULAR | Status: AC | PRN
  Administered 2018-01-16: 75 mL via INTRAVENOUS

## 2018-01-18 ENCOUNTER — Telehealth: Payer: Self-pay | Admitting: Internal Medicine

## 2018-01-18 ENCOUNTER — Inpatient Hospital Stay (HOSPITAL_BASED_OUTPATIENT_CLINIC_OR_DEPARTMENT_OTHER): Payer: Medicare Other | Admitting: Internal Medicine

## 2018-01-18 ENCOUNTER — Encounter: Payer: Self-pay | Admitting: Internal Medicine

## 2018-01-18 VITALS — BP 148/74 | HR 73 | Temp 98.3°F | Resp 17 | Ht 66.0 in | Wt 190.1 lb

## 2018-01-18 DIAGNOSIS — Z85118 Personal history of other malignant neoplasm of bronchus and lung: Secondary | ICD-10-CM

## 2018-01-18 DIAGNOSIS — J449 Chronic obstructive pulmonary disease, unspecified: Secondary | ICD-10-CM | POA: Diagnosis not present

## 2018-01-18 DIAGNOSIS — I10 Essential (primary) hypertension: Secondary | ICD-10-CM

## 2018-01-18 DIAGNOSIS — C3490 Malignant neoplasm of unspecified part of unspecified bronchus or lung: Secondary | ICD-10-CM

## 2018-01-18 DIAGNOSIS — C3492 Malignant neoplasm of unspecified part of left bronchus or lung: Secondary | ICD-10-CM

## 2018-01-18 NOTE — Telephone Encounter (Signed)
Appointments scheduled AVS/Calendar printed per 6/6 los

## 2018-01-18 NOTE — Progress Notes (Signed)
[facility] [phone]   [phone]  OFFICE PROGRESS NOTE  [name], [place] [address]  DIAGNOSIS: Stage IB (T2a, N0, M0) non-small cell lung cancer, poorly differentiated invasive squamous cell carcinoma diagnosed in April 2018  PRIOR THERAPY: Status post left upper lobectomy with lymph node dissection under the care of Dr. Roxan Hockey on 12/12/2016 with tumor size of 3.2 cm.  CURRENT THERAPY: Observation.  INTERVAL HISTORY: [name] 77 y.o. male returns to the clinic today for six-month follow-up visit.  The patient is feeling fine today with no specific complaints except for shortness of breath with exertion.  He denied having any chest pain, cough or hemoptysis.  He denied having any fever or chills.  He has no nausea, vomiting, diarrhea or constipation.  The patient had a repeat CT scan of the chest performed recently and is here for evaluation and discussion of his discuss results.  MEDICAL HISTORY: Past Medical History:  Diagnosis Date  . Anxiety    pt. reports that he " could swing from the ......" because he is so anxious about the surgery   . Arthritis    R hand tendonitis   . COPD (chronic obstructive pulmonary disease) ([place])   . Depression   . Hyperlipidemia   . Sleep apnea    cannot tolerate cpap    ALLERGIES:  is allergic to nsaids; gadolinium derivatives; and penicillins.  MEDICATIONS:  Current Outpatient Medications  Medication Sig Dispense Refill  . albuterol (PROVENTIL HFA;VENTOLIN HFA) 108 (90 Base) MCG/ACT inhaler Inhale 2 puffs into the lungs every 6 (six) hours as needed for wheezing or shortness of breath.     Marland Kitchen BREO ELLIPTA 200-25 MCG/INH AEPB Inhale 1 Inhaler into the lungs daily.  2  . fexofenadine (ALLEGRA) 180 MG tablet Take 180 mg by mouth daily as needed for allergies or rhinitis.    Marland Kitchen montelukast (SINGULAIR) 10 MG tablet Take 10 mg by mouth at bedtime.    . sertraline  (ZOLOFT) 50 MG tablet Take 1 tablet by mouth at bedtime.  5  . simvastatin (ZOCOR) 20 MG tablet Take 20 mg by mouth every evening.     No current facility-administered medications for this visit.     SURGICAL HISTORY:  Past Surgical History:  Procedure Laterality Date  . COLONOSCOPY W/ POLYPECTOMY    . cyst back  2011  . CYSTOSCOPY  2012   for bleeding in bladder x 3  . MOUTH SURGERY    . VIDEO ASSISTED THORACOSCOPY (VATS)/ LOBECTOMY Left 12/12/2016   Procedure: VIDEO ASSISTED THORACOSCOPY (VATS)/LEFT UPPER LOBECTOMY;  Surgeon: [name], MD;  Location: [facility];  Service: Thoracic;  Laterality: Left;    REVIEW OF SYSTEMS:  A comprehensive review of systems was negative except for: Respiratory: positive for dyspnea on exertion   PHYSICAL EXAMINATION: General appearance: alert, cooperative and no distress Head: Normocephalic, without obvious abnormality, atraumatic Neck: no adenopathy, no JVD, supple, symmetrical, trachea midline and thyroid not enlarged, symmetric, no tenderness/mass/nodules Lymph nodes: Cervical, supraclavicular, and axillary nodes normal. Resp: clear to auscultation bilaterally Back: symmetric, no curvature. ROM normal. No CVA tenderness. Cardio: regular rate and rhythm, S1, S2 normal, no murmur, click, rub or gallop GI: soft, non-tender; bowel sounds normal; no masses,  no organomegaly Extremities: extremities normal, atraumatic, no cyanosis or edema  ECOG PERFORMANCE STATUS: 1 - Symptomatic but completely ambulatory  Blood pressure (!) 148/74, pulse 73, temperature 98.3 F (36.8 C),  temperature source Oral, resp. rate 17, height 5\' 6"  (1.676 m), weight 190 lb 1.6 oz (86.2 kg), SpO2 94 %.  LABORATORY DATA: Lab Results  Component Value Date   WBC 8.2 01/15/2018   HGB 15.1 01/15/2018   HCT 44.7 01/15/2018   MCV 95.3 01/15/2018   PLT 253 01/15/2018      Chemistry      Component Value Date/Time   NA 138 01/15/2018 1018   NA 139 07/17/2017 1049     K 4.4 01/15/2018 1018   K 4.4 07/17/2017 1049   CL 103 01/15/2018 1018   CO2 28 01/15/2018 1018   CO2 29 07/17/2017 1049   BUN 17 01/15/2018 1018   BUN 14.8 07/17/2017 1049   CREATININE 0.80 01/15/2018 1018   CREATININE 0.8 07/17/2017 1049      Component Value Date/Time   CALCIUM 9.3 01/15/2018 1018   CALCIUM 9.3 07/17/2017 1049   ALKPHOS 75 01/15/2018 1018   ALKPHOS 81 07/17/2017 1049   AST 16 01/15/2018 1018   AST 16 07/17/2017 1049   ALT 17 01/15/2018 1018   ALT 22 07/17/2017 1049   BILITOT 0.7 01/15/2018 1018   BILITOT 0.55 07/17/2017 1049       RADIOGRAPHIC STUDIES: Dg Chest 2 View  Result Date: 12/26/2017 CLINICAL DATA:  History of lung carcinoma with VATS procedure. Currently presenting with cough EXAM: CHEST - 2 VIEW COMPARISON:  Chest radiograph March 21, 2017 and chest CT July 17, 2017 FINDINGS: There is scarring with volume loss on the left. There is currently no demonstrable mass. No edema or consolidation. Heart size and pulmonary vascularity are normal. No adenopathy. There is aortic atherosclerosis. There is mild degenerative change in the thoracic spine. IMPRESSION: Postoperative change on the left with scarring and volume loss. No edema or consolidation. No mass evident. No adenopathy. Heart size normal. There is aortic atherosclerosis. Aortic Atherosclerosis (ICD10-I70.0). Electronically Signed   By: Lowella Grip III M.D.   On: 12/26/2017 11:40   Ct Chest W Contrast  Result Date: 01/17/2018 CLINICAL DATA:  Left lung cancer. Cough and shortness of breath on exertion. EXAM: CT CHEST WITH CONTRAST TECHNIQUE: Multidetector CT imaging of the chest was performed during intravenous contrast administration. CONTRAST:  73mL OMNIPAQUE IOHEXOL 300 MG/ML  SOLN COMPARISON:  CT chest 07/17/2017. FINDINGS: Cardiovascular: Atherosclerotic calcification of the arterial vasculature, including coronary arteries. Pulmonary arteries are enlarged. Heart is at the upper limits of  normal in size. No pericardial effusion. Mediastinum/Nodes: Heterogeneous low-attenuation right thyroid nodule measures 2.3 cm, similar. Mediastinal and hilar lymph nodes are not enlarged by CT size criteria. No axillary adenopathy. Esophagus is grossly unremarkable. Lungs/Pleura: Centrilobular emphysema. Postoperative scarring and volume loss in the left upper lobe. Scattered pulmonary nodules measure up to 5 x 6 mm in the left lower lobe, stable. Pleural thickening at the base of the left hemithorax. No pleural fluid. Airway is unremarkable. Upper Abdomen: Subtle blushes of hyper attenuation are seen in the right hepatic lobe, as before. Visualized portions of the liver, gallbladder and right adrenal gland are unremarkable. Mild thickening of the lateral limb left adrenal gland. Visualized portion of the right kidney is unremarkable. Tiny stone in the left kidney. Visualized portions of the spleen, pancreas, stomach and bowel are grossly unremarkable. Incidental note is made of a duodenal diverticulum. No upper abdominal adenopathy. Musculoskeletal: Degenerative changes in the spine. IMPRESSION: 1. Postoperative scarring and volume loss in the left upper lobe. Scattered pulmonary nodules are unchanged. 2. Aortic atherosclerosis (ICD10-170.0). Coronary  artery calcification. 3. Enlarged pulmonary arteries, indicative of pulmonary arterial hypertension. 4. Right thyroid nodule, similar. Consider further evaluation with thyroid ultrasound. If patient is clinically hyperthyroid, consider nuclear medicine thyroid uptake and scan. 5.  Emphysema (ICD10-J43.9). 6. Tiny left renal stone. Electronically Signed   By: Lorin Picket M.D.   On: 01/17/2018 09:10    ASSESSMENT AND PLAN: This is a very pleasant 77 years old white male recently diagnosed with a stage IB non-small cell lung cancer, squamous cell carcinoma presented with left upper lobe lung mass status post left upper lobectomy with lymph node dissection. The  tumor size was 3.2 cm. There is no evidence for visceral pleural or lymphovascular invasion. The patient is currently on observation and he is feeling fine.  Repeat CT scan of the chest showed no concerning findings for disease progression.  I discussed the scan results with the patient and recommended for him to continue in observation with repeat CT scan of the chest in 6 months. The patient was advised to call immediately if he has any concerning symptoms in the interval. For COPD, he will continue his current treatment with Singulair, Breo and albuterol inhaler. For hypertension, he was advised to monitor his blood pressure closely at home and report to his primary care physician and stayed elevated. The patient voices understanding of current disease status and treatment options and is in agreement with the current care plan. All questions were answered. The patient knows to call the clinic with any problems, questions or concerns. We can certainly see the patient much sooner if necessary.  Disclaimer: This note was dictated with voice recognition software. Similar sounding words can inadvertently be transcribed and may not be corrected upon review.

## 2018-02-05 DIAGNOSIS — Z6832 Body mass index (BMI) 32.0-32.9, adult: Secondary | ICD-10-CM | POA: Diagnosis not present

## 2018-02-05 DIAGNOSIS — C3490 Malignant neoplasm of unspecified part of unspecified bronchus or lung: Secondary | ICD-10-CM | POA: Diagnosis not present

## 2018-02-05 DIAGNOSIS — Z1389 Encounter for screening for other disorder: Secondary | ICD-10-CM | POA: Diagnosis not present

## 2018-02-05 DIAGNOSIS — M79606 Pain in leg, unspecified: Secondary | ICD-10-CM | POA: Diagnosis not present

## 2018-02-05 DIAGNOSIS — J449 Chronic obstructive pulmonary disease, unspecified: Secondary | ICD-10-CM | POA: Diagnosis not present

## 2018-02-05 DIAGNOSIS — G4733 Obstructive sleep apnea (adult) (pediatric): Secondary | ICD-10-CM | POA: Diagnosis not present

## 2018-02-05 DIAGNOSIS — I10 Essential (primary) hypertension: Secondary | ICD-10-CM | POA: Diagnosis not present

## 2018-02-05 DIAGNOSIS — E668 Other obesity: Secondary | ICD-10-CM | POA: Diagnosis not present

## 2018-02-05 DIAGNOSIS — F329 Major depressive disorder, single episode, unspecified: Secondary | ICD-10-CM | POA: Diagnosis not present

## 2018-02-14 ENCOUNTER — Other Ambulatory Visit: Payer: Self-pay

## 2018-02-14 DIAGNOSIS — M79605 Pain in left leg: Principal | ICD-10-CM

## 2018-02-14 DIAGNOSIS — M79604 Pain in right leg: Secondary | ICD-10-CM

## 2018-03-01 ENCOUNTER — Encounter: Payer: Self-pay | Admitting: Vascular Surgery

## 2018-03-01 ENCOUNTER — Ambulatory Visit (HOSPITAL_COMMUNITY)
Admission: RE | Admit: 2018-03-01 | Discharge: 2018-03-01 | Disposition: A | Discharge status: Home / Self Care | Payer: Medicare Other | Source: Ambulatory Visit | Attending: Vascular Surgery | Admitting: Vascular Surgery

## 2018-03-01 ENCOUNTER — Ambulatory Visit (INDEPENDENT_AMBULATORY_CARE_PROVIDER_SITE_OTHER): Payer: Medicare Other | Admitting: Vascular Surgery

## 2018-03-01 ENCOUNTER — Other Ambulatory Visit: Payer: Self-pay

## 2018-03-01 DIAGNOSIS — Z87891 Personal history of nicotine dependence: Secondary | ICD-10-CM | POA: Diagnosis not present

## 2018-03-01 DIAGNOSIS — I70213 Atherosclerosis of native arteries of extremities with intermittent claudication, bilateral legs: Secondary | ICD-10-CM | POA: Diagnosis not present

## 2018-03-01 DIAGNOSIS — M79604 Pain in right leg: Secondary | ICD-10-CM

## 2018-03-01 DIAGNOSIS — I739 Peripheral vascular disease, unspecified: Secondary | ICD-10-CM | POA: Diagnosis not present

## 2018-03-01 DIAGNOSIS — M79605 Pain in left leg: Secondary | ICD-10-CM | POA: Diagnosis not present

## 2018-03-01 NOTE — Progress Notes (Addendum)
Requested by:  [name], [address], [place] [address]  Reason for consultation: BLE pain   History of Present Illness   [name] is a 77 y.o. (12/20/1940) male who presents with chief complaint: BLE leg pain.  Past medical history significant for lung cancer with partial lung resection about 1 year ago by Dr. Roxan Hockey.  Patient states since this surgery he feels that his activity level and strength of his legs has not been able to return to baseline.  On good days he is able to walk about a half a mile before requiring a rest due to symmetrical cramping in bilateral calves.  However, ambulation distance before onset of symptoms is usually around 1-2 blocks.  He denies ever having any rest pain, or slow to heal wounds of bilateral lower extremities.  Patient states this limits his activity however the symptoms do not bother him enough to seek further work-up or invasive treatment.  He is taking a statin daily.  Unable to take aspirin due to growth in bladder and history of hematuria.  Former tobacco smoker.  Patient still follows with oncology for periodic scans of his chest.    Past Medical History:  Diagnosis Date  . Anxiety    pt. reports that he " could swing from the ......" because he is so anxious about the surgery   . Arthritis    R hand tendonitis   . COPD (chronic obstructive pulmonary disease) ([place])   . Depression   . Hyperlipidemia   . Sleep apnea    cannot tolerate cpap    Past Surgical History:  Procedure Laterality Date  . COLONOSCOPY W/ POLYPECTOMY    . cyst back  2011  . CYSTOSCOPY  2012   for bleeding in bladder x 3  . MOUTH SURGERY    . VIDEO ASSISTED THORACOSCOPY (VATS)/ LOBECTOMY Left 12/12/2016   Procedure: VIDEO ASSISTED THORACOSCOPY (VATS)/LEFT UPPER LOBECTOMY;  Surgeon: [name], MD;  Location: [facility];  Service: Thoracic;  Laterality: Left;    Social History   Socioeconomic History  . Marital status: Married    Spouse name: Not on file  . Number of children: Not on file  . Years of education: Not on file  . Highest education level: Not on file  Occupational History  . Occupation: Museum/gallery curator  . Financial resource strain: Not on file  . Food insecurity:    Worry: Not on file    Inability: Not on file  . Transportation needs:    Medical: Not on file    Non-medical: Not on file  Tobacco Use  . Smoking status: Former Smoker    Packs/day: 0.50    Types: Cigarettes    Last attempt to quit: 11/29/2016    Years since quitting: 1.2  . Smokeless tobacco: Never Used  Substance and Sexual Activity  . Alcohol use: No  . Drug use: No  . Sexual activity: Not on file  Lifestyle  . Physical activity:    Days per week: Not on file    Minutes per session: Not on file  . Stress: Not on file  Relationships  . Social connections:    Talks on phone: Not on file    Gets together: Not on file    Attends religious service: Not on file    Active member of club or organization: Not on file    Attends meetings of clubs or organizations: Not on file  Relationship status: Not on file  . Intimate partner violence:    Fear of current or ex partner: Not on file    Emotionally abused: Not on file    Physically abused: Not on file    Forced sexual activity: Not on file  Other Topics Concern  . Not on file  Social History Narrative  . Not on file    Family History  Problem Relation Age of Onset  . Colon cancer Brother   . Emphysema Unknown   . Heart disease Unknown   . Cancer Unknown   . Heart disease Father   . Heart disease Maternal Uncle   . Heart disease Paternal Uncle     Current Outpatient Medications  Medication Sig Dispense Refill  . albuterol (PROVENTIL HFA;VENTOLIN HFA) 108 (90 Base) MCG/ACT inhaler Inhale 2 puffs into the lungs every 6 (six) hours as needed for wheezing or shortness of breath.     Marland Kitchen BREO ELLIPTA 200-25 MCG/INH AEPB Inhale 1 Inhaler into the lungs daily.   2  . fexofenadine (ALLEGRA) 180 MG tablet Take 180 mg by mouth daily as needed for allergies or rhinitis.    Marland Kitchen montelukast (SINGULAIR) 10 MG tablet Take 10 mg by mouth at bedtime.    . sertraline (ZOLOFT) 50 MG tablet Take 1 tablet by mouth at bedtime.  5  . simvastatin (ZOCOR) 20 MG tablet Take 20 mg by mouth every evening.     No current facility-administered medications for this visit.     Allergies  Allergen Reactions  . Nsaids Other (See Comments)    MD RESTRICTS USE OF ANY "BLOOD THINING" MEDS [ "DUE TO GROWTH IN BLADDER" ]   . Gadolinium Derivatives Nausea And Vomiting and Other (See Comments)    CHILLS SHAKING FEELING COLD  . Penicillins Other (See Comments)    "Feels like my skin is crawling"  Has patient had a PCN reaction causing immediate rash, facial/tongue/throat swelling, SOB or lightheadedness with hypotension: No Has patient had a PCN reaction causing severe rash involving mucus membranes or skin necrosis: No Has patient had a PCN reaction that required hospitalization No Has patient had a PCN reaction occurring within the last 10 years: No If all of the above answers are "NO", then may proceed with Cephalosporin use.     REVIEW OF SYSTEMS (negative unless checked):   Cardiac:  []  Chest pain or chest pressure? [x]  Shortness of breath upon activity? []  Shortness of breath when lying flat? []  Irregular heart rhythm?  Vascular:  [x]  Pain in calf, thigh, or hip brought on by walking? []  Pain in feet at night that wakes you up from your sleep? []  Blood clot in your veins? []  Leg swelling?  Pulmonary:  []  Oxygen at home? []  Productive cough? []  Wheezing?  Neurologic:  []  Sudden weakness in arms or legs? []  Sudden numbness in arms or legs? []  Sudden onset of difficult speaking or slurred speech? []  Temporary loss of vision in one eye? []  Problems with dizziness?  Gastrointestinal:  []  Blood in stool? []  Vomited blood?  Genitourinary:  []  Burning  when urinating? []  Blood in urine?  Psychiatric:  []  Major depression  Hematologic:  []  Bleeding problems? []  Problems with blood clotting?  Dermatologic:  []  Rashes or ulcers?  Constitutional:  []  Fever or chills?  Ear/Nose/Throat:  []  Change in hearing? []  Nose bleeds? []  Sore throat?  Musculoskeletal:  []  Back pain? []  Joint pain? []  Muscle pain?   For VQI Use Only  PRE-ADM LIVING Home  AMB STATUS Ambulatory  CAD Sx None  PRIOR CHF None  STRESS TEST No   Physical Examination     Vitals:   03/01/18 1351  BP: (!) 147/83  Pulse: 70  Resp: 20  Temp: 98.1 F (36.7 C)  TempSrc: Oral  SpO2: 93%  Weight: 192 lb (87.1 kg)  Height: 5\' 6"  (1.676 m)   Body mass index is 30.99 kg/m.  General Alert, O x 3, WD, NAD  Head [place]/AT,    Neck Supple, mid-line trachea,    Pulmonary Sym exp, good B air movt, wheezing on expiration  Cardiac ectopy with inspiration, no Murmurs, No rubs, No S3,S4  Vascular Vessel Right Left  Radial Palpable Palpable  Brachial Palpable Palpable  Carotid Palpable, No Bruit Palpable, No Bruit  Aorta Not palpable N/A  Femoral Not examined Not examined  Popliteal Not palpable Not palpable  PT Palpable Palpable  DP Faintly palpable Not palpable    Gastro- intestinal soft, non-distended, no palpable aneurysm however exam limited due to large body habitus  Musculo- skeletal M/S 5/5 throughout  , Extremities without ischemic changes  , No edema present, No visible varicosities , No Lipodermatosclerosis present  Neurologic Cranial nerves 2-12 intact , Pain and light touch intact in extremities , Motor exam as listed above  Psychiatric Judgement intact, Mood & affect appropriate for pt's clinical situation  Dermatologic See M/S exam for extremity exam, No rashes otherwise noted  Lymphatic  Palpable lymph nodes: None    Non-Invasive Vascular imaging   BLE ABI (03/01/18)  R:   ABI: 1.21,   PT: tri  DP: bi  L:   ABI: 1.13,    PT: bi  DP: bi   Medical Decision Making   [name] is a 77 y.o. male who presents with: BLE intermittent claudication.   Dr. Oneida Alar was involved in the evaluation and management plan of this patient  Given patient's symptoms, he likely has some level of atherosclerotic disease in bilateral lower extremities however with relatively normal ABIs and palpable PTA pulses bilaterally, he is perfusing bilateral lower extremities adequately  Patient states his symptoms are not extremely bothersome to his everyday life and thus would not like to pursue more invasive work-up/treatment  Continue statin use  Encouraged to exercise/walking program  Check exercise ABIs in 6 months  Patient will call or return to office sooner if symptoms worsen  Dagoberto Ligas PA-C [facility]: [phone]  03/01/2018, 2:43 PM   History and exam findings as above.  Patient has symptoms of lower extremity claudication in both legs.  He does not currently feel that he is significantly disabled by this.  His ABIs were normal so I do not believe he is currently at risk of limb loss.  I discussed with him the possibility of an arteriogram for further evaluation or bilateral ABIs with exercise to see if there is a pressure drop.  He potentially could have some iliac occlusive disease contributing to this.  He prefers to observe this for now and do a walking program of 30 minutes daily.  When he returns in 6 months we will do exercise ABIs.  If he decides he wishes further evaluation prior to this we could move this appointment up.  Ruta Hinds, MD [facility] Office: [phone] Pager: [phone]

## 2018-03-26 ENCOUNTER — Other Ambulatory Visit: Payer: Self-pay

## 2018-03-26 DIAGNOSIS — M79604 Pain in right leg: Secondary | ICD-10-CM

## 2018-03-26 DIAGNOSIS — I70213 Atherosclerosis of native arteries of extremities with intermittent claudication, bilateral legs: Secondary | ICD-10-CM

## 2018-03-26 DIAGNOSIS — M79605 Pain in left leg: Secondary | ICD-10-CM

## 2018-03-26 DIAGNOSIS — I739 Peripheral vascular disease, unspecified: Secondary | ICD-10-CM

## 2018-04-03 IMAGING — DX DG CHEST 2V
2 series · 2 of 2 positions shown · non-contrast
Comparison: 01/17/2017

CLINICAL DATA: Lung cancer.  Status post VATS.

EXAM:
CHEST  2 VIEW

[dg chest 2 view (1 of 2)]
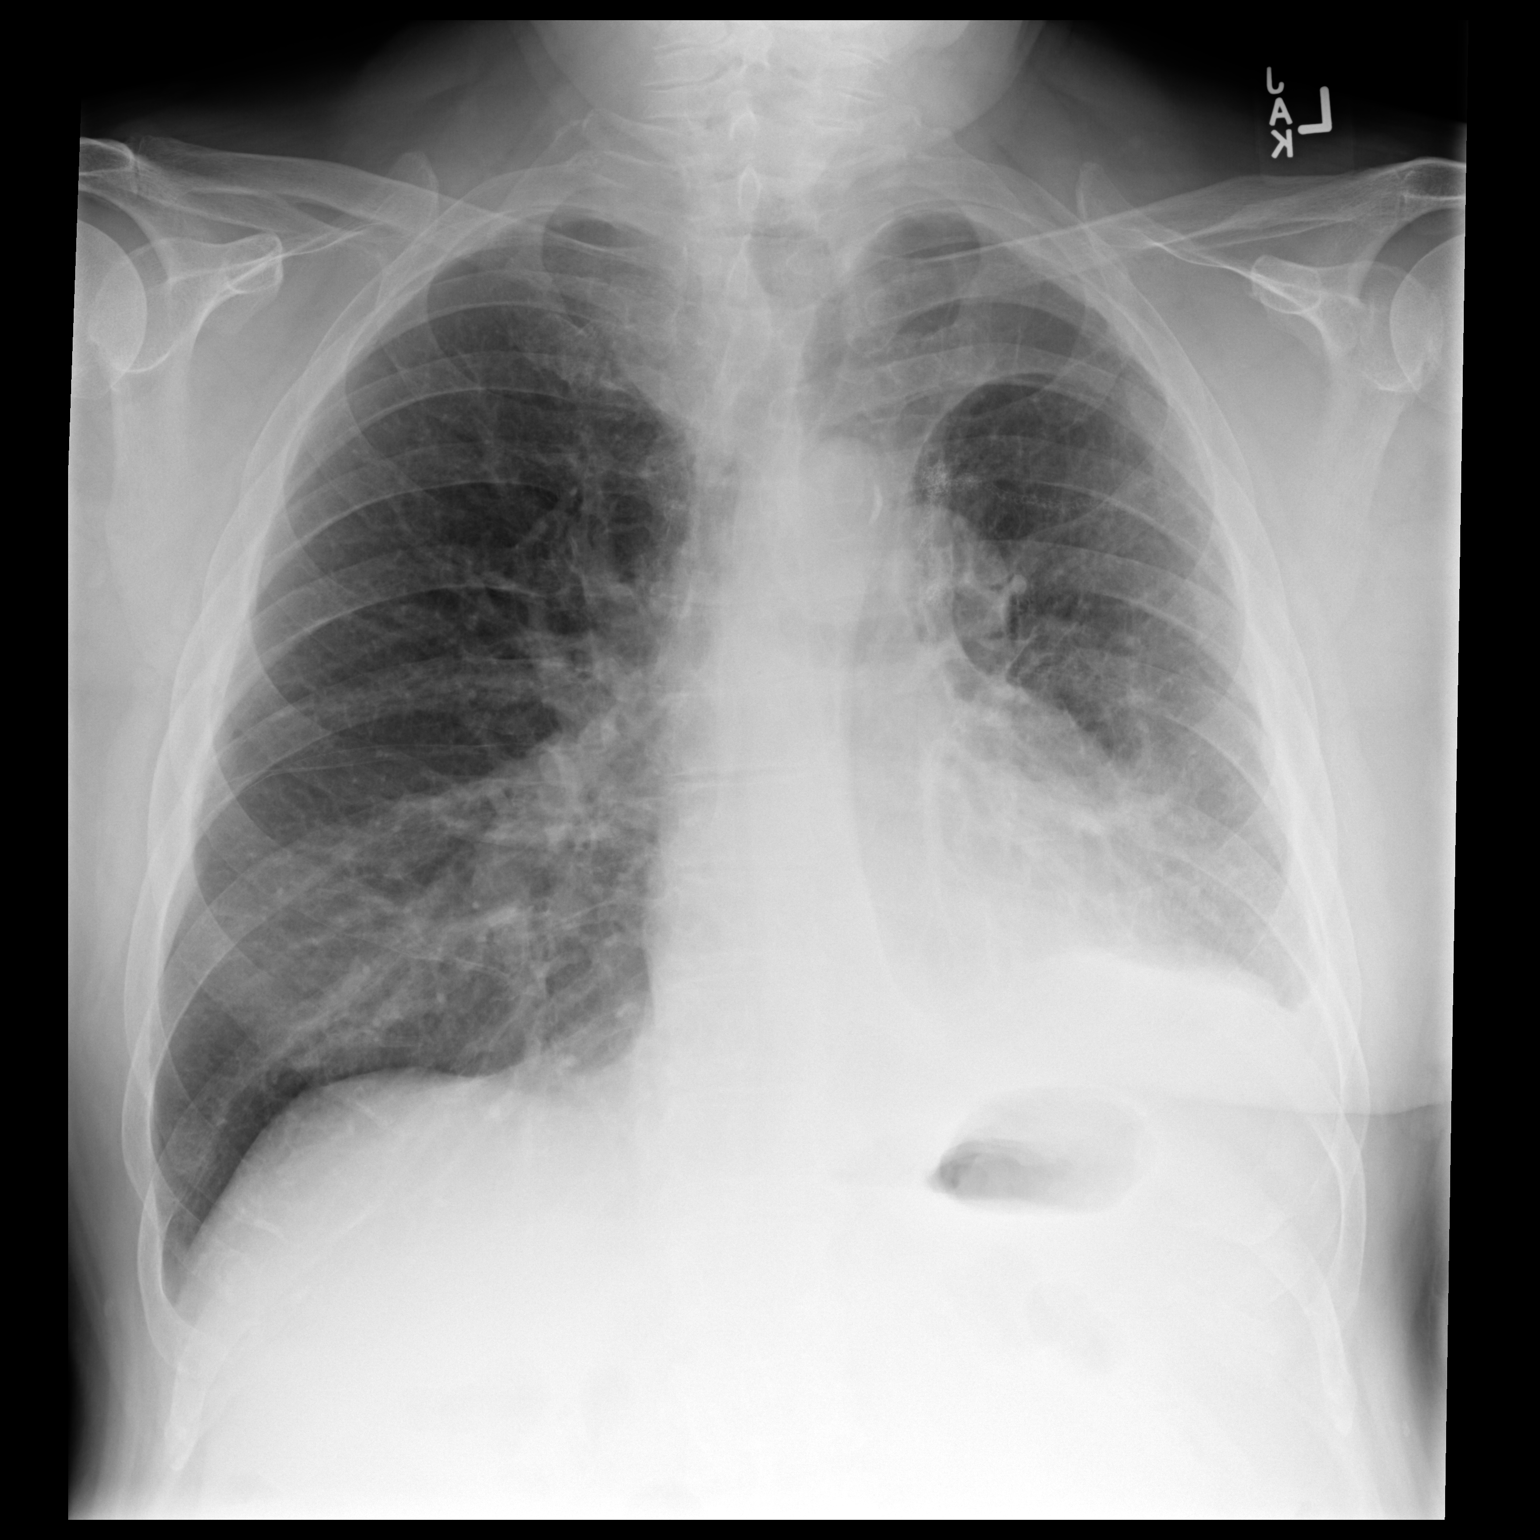

[dg chest 2 view (2 of 2)]
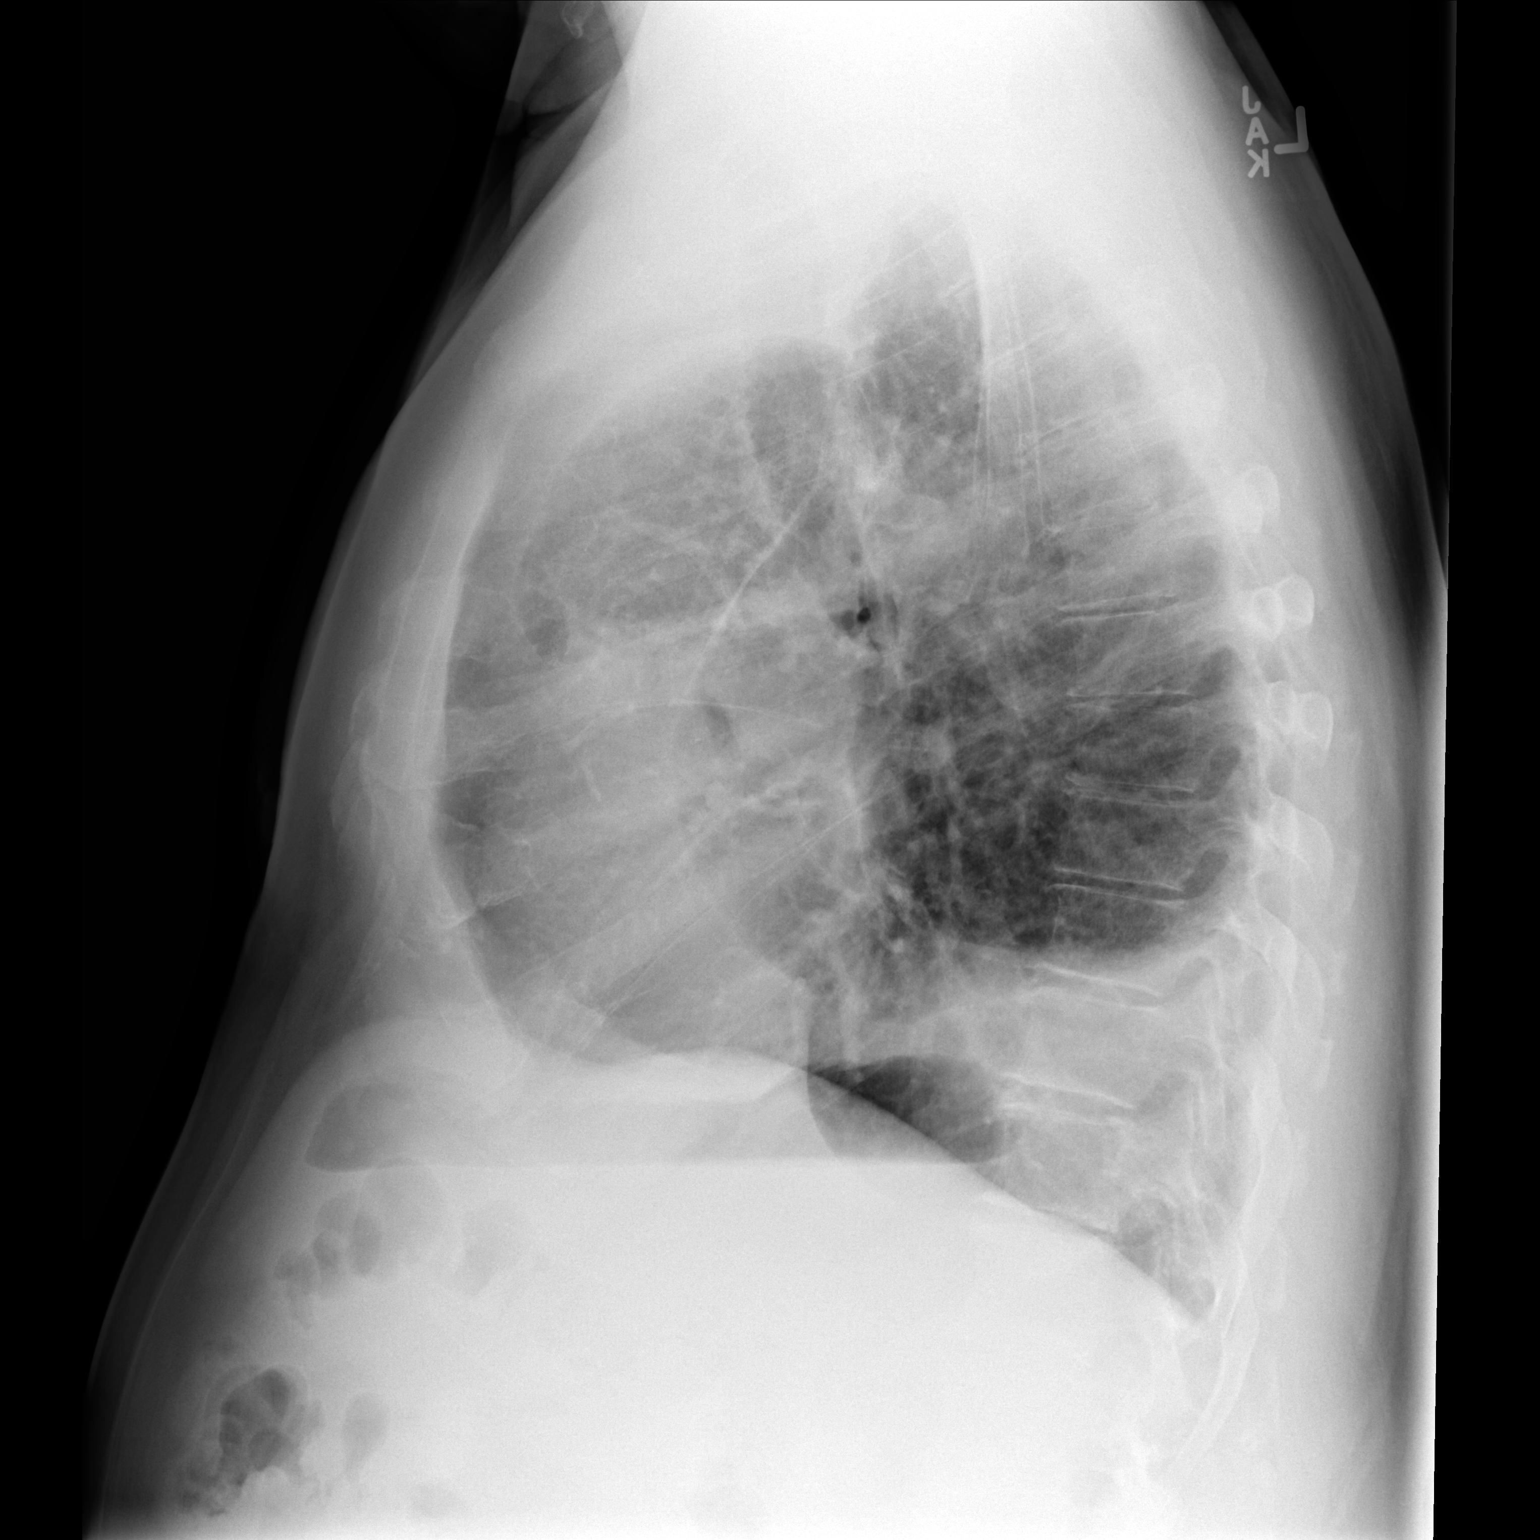

[2 of 2 positions shown; findings below may reference images not displayed]

FINDINGS: The heart size is normal. There is aortic atherosclerosis.
Postsurgical changes and volume loss from the left lung status post
VATS and left upper lobectomy noted. The right lung appears clear.
The visualized osseous structures are unremarkable.
IMPRESSION: 1. Stable appearance of the chest status post left upper lobectomy
and left lung VATS.

## 2018-05-24 DIAGNOSIS — Z23 Encounter for immunization: Secondary | ICD-10-CM | POA: Diagnosis not present

## 2018-07-20 ENCOUNTER — Ambulatory Visit (HOSPITAL_COMMUNITY)
Admission: RE | Admit: 2018-07-20 | Discharge: 2018-07-20 | Disposition: A | Discharge status: Home / Self Care | Payer: Medicare Other | Source: Ambulatory Visit | Attending: Internal Medicine | Admitting: Internal Medicine

## 2018-07-20 ENCOUNTER — Inpatient Hospital Stay: Payer: Medicare Other | Attending: Internal Medicine

## 2018-07-20 ENCOUNTER — Encounter (HOSPITAL_COMMUNITY): Payer: Self-pay

## 2018-07-20 DIAGNOSIS — Z79899 Other long term (current) drug therapy: Secondary | ICD-10-CM | POA: Diagnosis not present

## 2018-07-20 DIAGNOSIS — C3412 Malignant neoplasm of upper lobe, left bronchus or lung: Secondary | ICD-10-CM | POA: Insufficient documentation

## 2018-07-20 DIAGNOSIS — I10 Essential (primary) hypertension: Secondary | ICD-10-CM | POA: Diagnosis not present

## 2018-07-20 DIAGNOSIS — J439 Emphysema, unspecified: Secondary | ICD-10-CM | POA: Diagnosis not present

## 2018-07-20 DIAGNOSIS — C3490 Malignant neoplasm of unspecified part of unspecified bronchus or lung: Secondary | ICD-10-CM

## 2018-07-20 HISTORY — DX: Malignant (primary) neoplasm, unspecified: C80.1

## 2018-07-20 LAB — CMP (CANCER CENTER ONLY)
ALK PHOS: 79 U/L (ref 38–126)
ALT: 15 U/L (ref 0–44)
AST: 16 U/L (ref 15–41)
Albumin: 3.6 g/dL (ref 3.5–5.0)
Anion gap: 8 (ref 5–15)
BUN: 15 mg/dL (ref 8–23)
CALCIUM: 9.3 mg/dL (ref 8.9–10.3)
CHLORIDE: 103 mmol/L (ref 98–111)
CO2: 30 mmol/L (ref 22–32)
Creatinine: 0.81 mg/dL (ref 0.61–1.24)
GFR, Estimated: >60 mL/min (ref >60)
Glucose, Bld: 105 mg/dL — ABNORMAL HIGH (ref 70–99)
Potassium: 4.4 mmol/L (ref 3.5–5.1)
SODIUM: 141 mmol/L (ref 135–145)
Total Bilirubin: 0.6 mg/dL (ref 0.3–1.2)
Total Protein: 6.9 g/dL (ref 6.5–8.1)

## 2018-07-20 LAB — CBC WITH DIFFERENTIAL (CANCER CENTER ONLY)
ABS IMMATURE GRANULOCYTES: 0.09 10*3/uL — AB (ref 0.00–0.07)
Basophils Absolute: 0.1 10*3/uL (ref 0.0–0.1)
Basophils Relative: 1 %
Eosinophils Absolute: 0.3 10*3/uL (ref 0.0–0.5)
Eosinophils Relative: 3 %
HCT: 44.7 % (ref 39.0–52.0)
HEMOGLOBIN: 14.8 g/dL (ref 13.0–17.0)
IMMATURE GRANULOCYTES: 1 %
LYMPHS PCT: 9 %
Lymphs Abs: 0.9 10*3/uL (ref 0.7–4.0)
MCH: 31.7 pg (ref 26.0–34.0)
MCHC: 33.1 g/dL (ref 30.0–36.0)
MCV: 95.7 fL (ref 80.0–100.0)
MONO ABS: 1.4 10*3/uL — AB (ref 0.1–1.0)
MONOS PCT: 15 %
NEUTROS ABS: 7 10*3/uL (ref 1.7–7.7)
NEUTROS PCT: 71 %
Platelet Count: 266 10*3/uL (ref 150–400)
RBC: 4.67 MIL/uL (ref 4.22–5.81)
RDW: 13.3 % (ref 11.5–15.5)
WBC Count: 9.7 10*3/uL (ref 4.0–10.5)
nRBC: 0 % (ref 0.0–0.2)

## 2018-07-20 MED ORDER — IOHEXOL 300 MG/ML  SOLN
85.0000 mL | Freq: Once | INTRAMUSCULAR | Status: AC | PRN
  Administered 2018-07-20: 85 mL via INTRAVENOUS

## 2018-07-20 MED ORDER — SODIUM CHLORIDE (PF) 0.9 % IJ SOLN
INTRAMUSCULAR | Status: AC
  Filled 2018-07-20: qty 50

## 2018-07-25 ENCOUNTER — Encounter: Payer: Self-pay | Admitting: Internal Medicine

## 2018-07-25 ENCOUNTER — Inpatient Hospital Stay (HOSPITAL_BASED_OUTPATIENT_CLINIC_OR_DEPARTMENT_OTHER): Payer: Medicare Other | Admitting: Internal Medicine

## 2018-07-25 ENCOUNTER — Telehealth: Payer: Self-pay | Admitting: Internal Medicine

## 2018-07-25 VITALS — BP 142/75 | HR 64 | Temp 97.6°F | Resp 18 | Ht 66.0 in | Wt 197.9 lb

## 2018-07-25 DIAGNOSIS — C3412 Malignant neoplasm of upper lobe, left bronchus or lung: Secondary | ICD-10-CM

## 2018-07-25 DIAGNOSIS — I10 Essential (primary) hypertension: Secondary | ICD-10-CM | POA: Diagnosis not present

## 2018-07-25 DIAGNOSIS — C3490 Malignant neoplasm of unspecified part of unspecified bronchus or lung: Secondary | ICD-10-CM

## 2018-07-25 DIAGNOSIS — C3492 Malignant neoplasm of unspecified part of left bronchus or lung: Secondary | ICD-10-CM

## 2018-07-25 DIAGNOSIS — J430 Unilateral pulmonary emphysema [MacLeod's syndrome]: Secondary | ICD-10-CM

## 2018-07-25 DIAGNOSIS — Z79899 Other long term (current) drug therapy: Secondary | ICD-10-CM | POA: Diagnosis not present

## 2018-07-25 NOTE — Progress Notes (Signed)
[facility] [phone]   Fax:([phone]) [phone]  OFFICE PROGRESS NOTE  David Solian, MD [place] [address]  DIAGNOSIS: Stage IB (T2a, N0, M0) non-small cell lung cancer, poorly differentiated invasive squamous cell carcinoma diagnosed in April 2018  PRIOR THERAPY: Status post left upper lobectomy with lymph node dissection under the care of Dr. Roxan Hockey on 12/12/2016 with tumor size of 3.2 cm.  CURRENT THERAPY: Observation.  INTERVAL HISTORY: [name] 77 y.o. male returns to the clinic today for follow-up visit.  The patient is feeling fine today with no concerning complaints except for shortness of breath with exertion.  He denied having any chest pain, cough or hemoptysis.  He denied having any recent weight loss or night sweats.  He has no nausea, vomiting, diarrhea or constipation.  He had repeat CT scan of the chest performed recently and is here for evaluation and discussion of his scan results.  MEDICAL HISTORY: Past Medical History:  Diagnosis Date  . Anxiety    pt. reports that he " could swing from the ......" because he is so anxious about the surgery   . Arthritis    R hand tendonitis   . COPD (chronic obstructive pulmonary disease) ([place])   . Depression   . Hyperlipidemia   . lung ca dx'd 10/2016  . Sleep apnea    cannot tolerate cpap    ALLERGIES:  is allergic to nsaids; gadolinium derivatives; and penicillins.  MEDICATIONS:  Current Outpatient Medications  Medication Sig Dispense Refill  . albuterol (PROVENTIL HFA;VENTOLIN HFA) 108 (90 Base) MCG/ACT inhaler Inhale 2 puffs into the lungs every 6 (six) hours as needed for wheezing or shortness of breath.     Marland Kitchen BREO ELLIPTA 200-25 MCG/INH AEPB Inhale 1 Inhaler into the lungs daily.  2  . fexofenadine (ALLEGRA) 180 MG tablet Take 180 mg by mouth daily as needed for allergies or rhinitis.    Marland Kitchen montelukast (SINGULAIR) 10 MG tablet Take 10 mg by mouth at  bedtime.    . sertraline (ZOLOFT) 50 MG tablet Take 1 tablet by mouth at bedtime.  5  . simvastatin (ZOCOR) 20 MG tablet Take 20 mg by mouth every evening.     No current facility-administered medications for this visit.     SURGICAL HISTORY:  Past Surgical History:  Procedure Laterality Date  . COLONOSCOPY W/ POLYPECTOMY    . cyst back  2011  . CYSTOSCOPY  2012   for bleeding in bladder x 3  . MOUTH SURGERY    . VIDEO ASSISTED THORACOSCOPY (VATS)/ LOBECTOMY Left 12/12/2016   Procedure: VIDEO ASSISTED THORACOSCOPY (VATS)/LEFT UPPER LOBECTOMY;  Surgeon: [name], MD;  Location: [facility];  Service: Thoracic;  Laterality: Left;    REVIEW OF SYSTEMS:  A comprehensive review of systems was negative except for: Respiratory: positive for dyspnea on exertion   PHYSICAL EXAMINATION: General appearance: alert, cooperative and no distress Head: Normocephalic, without obvious abnormality, atraumatic Neck: no adenopathy, no JVD, supple, symmetrical, trachea midline and thyroid not enlarged, symmetric, no tenderness/mass/nodules Lymph nodes: Cervical, supraclavicular, and axillary nodes normal. Resp: clear to auscultation bilaterally Back: symmetric, no curvature. ROM normal. No CVA tenderness. Cardio: regular rate and rhythm, S1, S2 normal, no murmur, click, rub or gallop GI: soft, non-tender; bowel sounds normal; no masses,  no organomegaly Extremities: extremities normal, atraumatic, no cyanosis or edema  ECOG PERFORMANCE STATUS: 1 - Symptomatic but completely ambulatory  Blood pressure (!) 142/75, pulse  64, temperature 97.6 F (36.4 C), temperature source Oral, resp. rate 18, height 5\' 6"  (1.676 m), weight 197 lb 14.4 oz (89.8 kg), SpO2 96 %.  LABORATORY DATA: Lab Results  Component Value Date   WBC 9.7 07/20/2018   HGB 14.8 07/20/2018   HCT 44.7 07/20/2018   MCV 95.7 07/20/2018   PLT 266 07/20/2018      Chemistry      Component Value Date/Time   NA 141 07/20/2018 1001     NA 139 07/17/2017 1049   K 4.4 07/20/2018 1001   K 4.4 07/17/2017 1049   CL 103 07/20/2018 1001   CO2 30 07/20/2018 1001   CO2 29 07/17/2017 1049   BUN 15 07/20/2018 1001   BUN 14.8 07/17/2017 1049   CREATININE 0.81 07/20/2018 1001   CREATININE 0.8 07/17/2017 1049      Component Value Date/Time   CALCIUM 9.3 07/20/2018 1001   CALCIUM 9.3 07/17/2017 1049   ALKPHOS 79 07/20/2018 1001   ALKPHOS 81 07/17/2017 1049   AST 16 07/20/2018 1001   AST 16 07/17/2017 1049   ALT 15 07/20/2018 1001   ALT 22 07/17/2017 1049   BILITOT 0.6 07/20/2018 1001   BILITOT 0.55 07/17/2017 1049       RADIOGRAPHIC STUDIES: Ct Chest W Contrast  Result Date: 07/20/2018 CLINICAL DATA:  Patient with history of lung cancer. EXAM: CT CHEST WITH CONTRAST TECHNIQUE: Multidetector CT imaging of the chest was performed during intravenous contrast administration. CONTRAST:  2mL OMNIPAQUE IOHEXOL 300 MG/ML  SOLN COMPARISON:  Chest CT 01/16/2018 FINDINGS: Cardiovascular: Normal heart size. No pericardial effusion. Dilated main pulmonary artery. Mediastinum/Nodes: No enlarged axillary, mediastinal or hilar lymphadenopathy. Lungs/Pleura: Central airways are patent. Centrilobular emphysematous change. Similar-appearing postoperative scarring and volume loss within the left upper lobe. Unchanged 6 mm nodule within the superior segment left lower lobe (image 47; series 7). Stable 4 mm right lower lobe nodule (image 98; series 7). Interval increase in size of 8 mm right upper lobe nodule (image 56; series 7), previously 5 mm. Upper Abdomen: Unchanged focal areas of hyperenhancement within the liver, predominately within the right hepatic lobe. Gallbladder is unremarkable. No acute process. Musculoskeletal: Thoracic spine degenerative changes. No aggressive or acute appearing osseous lesions. IMPRESSION: 1. Interval increase in size of irregular nodule within the right upper lobe when compared to prior exams raising the  possibility of primary malignancy or metastatic disease. 2. Stable postsurgical changes left hemithorax. 3. Aortic Atherosclerosis (ICD10-I70.0) and Emphysema (ICD10-J43.9). 4. These results will be called to the ordering clinician or representative by the Radiologist Assistant, and communication documented in the PACS or zVision Dashboard. Electronically Signed   By: Lovey Newcomer M.D.   On: 07/20/2018 17:41    ASSESSMENT AND PLAN: This is a very pleasant 77 years old white male recently diagnosed with a stage IB non-small cell lung cancer, squamous cell carcinoma presented with left upper lobe lung mass status post left upper lobectomy with lymph node dissection. The tumor size was 3.2 cm. There is no evidence for visceral pleural or lymphovascular invasion. The patient is currently on observation and he is feeling fine except for shortness of breath with exertion. He had repeat CT scan of the chest performed recently.  I personally and independently reviewed the scan images and discussed the result and showed the images to the patient today.  His a scan showed stable disease except for mild increase and right upper lobe pulmonary nodule that is currently measure 8 mm in  size. I recommended for the patient to continue on observation but we will have repeat CT scan of the chest in 4 months for restaging of his disease and close monitoring of this pulmonary nodule. For hypertension, he was advised to monitor his blood pressure closely at home and report to his primary care physician and stayed elevated. He was advised to call immediately if he has any concerning symptoms in the interval. The patient voices understanding of current disease status and treatment options and is in agreement with the current care plan. All questions were answered. The patient knows to call the clinic with any problems, questions or concerns. We can certainly see the patient much sooner if necessary.  Disclaimer: This note was  dictated with voice recognition software. Similar sounding words can inadvertently be transcribed and may not be corrected upon review.

## 2018-07-25 NOTE — Telephone Encounter (Signed)
Printed calendar and avs. °

## 2018-07-30 DIAGNOSIS — N452 Orchitis: Secondary | ICD-10-CM | POA: Diagnosis not present

## 2018-07-31 ENCOUNTER — Other Ambulatory Visit: Payer: Self-pay | Admitting: Internal Medicine

## 2018-07-31 DIAGNOSIS — N452 Orchitis: Secondary | ICD-10-CM | POA: Diagnosis not present

## 2018-07-31 DIAGNOSIS — R188 Other ascites: Secondary | ICD-10-CM | POA: Diagnosis not present

## 2018-07-31 DIAGNOSIS — N433 Hydrocele, unspecified: Secondary | ICD-10-CM | POA: Diagnosis not present

## 2018-08-22 DIAGNOSIS — R82998 Other abnormal findings in urine: Secondary | ICD-10-CM | POA: Diagnosis not present

## 2018-08-22 DIAGNOSIS — E7849 Other hyperlipidemia: Secondary | ICD-10-CM | POA: Diagnosis not present

## 2018-08-22 DIAGNOSIS — Z125 Encounter for screening for malignant neoplasm of prostate: Secondary | ICD-10-CM | POA: Diagnosis not present

## 2018-08-22 DIAGNOSIS — E0590 Thyrotoxicosis, unspecified without thyrotoxic crisis or storm: Secondary | ICD-10-CM | POA: Diagnosis not present

## 2018-08-22 DIAGNOSIS — I10 Essential (primary) hypertension: Secondary | ICD-10-CM | POA: Diagnosis not present

## 2018-08-24 ENCOUNTER — Ambulatory Visit (INDEPENDENT_AMBULATORY_CARE_PROVIDER_SITE_OTHER): Payer: Medicare Other | Admitting: Family

## 2018-08-24 ENCOUNTER — Other Ambulatory Visit: Payer: Self-pay

## 2018-08-24 ENCOUNTER — Encounter (HOSPITAL_COMMUNITY): Payer: Medicare Other

## 2018-08-24 ENCOUNTER — Encounter: Payer: Self-pay | Admitting: Family

## 2018-08-24 ENCOUNTER — Ambulatory Visit (HOSPITAL_COMMUNITY)
Admission: RE | Admit: 2018-08-24 | Discharge: 2018-08-24 | Disposition: A | Discharge status: Home / Self Care | Payer: Medicare Other | Source: Ambulatory Visit | Attending: Internal Medicine | Admitting: Internal Medicine

## 2018-08-24 VITALS — BP 130/77 | HR 87 | Temp 97.0°F | Resp 18 | Ht 66.0 in | Wt 194.0 lb

## 2018-08-24 DIAGNOSIS — M79605 Pain in left leg: Secondary | ICD-10-CM

## 2018-08-24 DIAGNOSIS — Z87891 Personal history of nicotine dependence: Secondary | ICD-10-CM

## 2018-08-24 DIAGNOSIS — I70213 Atherosclerosis of native arteries of extremities with intermittent claudication, bilateral legs: Secondary | ICD-10-CM | POA: Diagnosis not present

## 2018-08-24 DIAGNOSIS — M79604 Pain in right leg: Secondary | ICD-10-CM | POA: Diagnosis not present

## 2018-08-24 DIAGNOSIS — I739 Peripheral vascular disease, unspecified: Secondary | ICD-10-CM | POA: Diagnosis not present

## 2018-08-24 NOTE — Progress Notes (Signed)
[facility] [place]   CC: Follow up intermittent claudication  History of Present Illness [name] is a 78 y.o. male who returns for evaluation of his intermittent claudication of his calves and achilles heels.  His ast medical history significant for lung cancer with partial lung resection in 2018 by Dr. Roxan Hockey.  Patient states since this surgery he feels that his activity level and strength of his legs has not been able to return to baseline.  Patient still follows with oncology for periodic scans of his chest.    Dr. Oneida Alar last evaluated pt on 03-01-18. At that time patient had symptoms of lower extremity claudication in both legs.  He did not feel significantly disabled by this.  His ABIs were normal. Dr. Oneida Alar discussed with pt the possibility of an arteriogram for further evaluation, or bilateral ABIs with exercise to see if there was a pressure drop.  He potentially could have some iliac occlusive disease contributing to this.  Pt preferred to observe this  and do a walking program of 30 minutes daily.  When he returned in 6 months we will do exercise ABIs.    After walking about 5 minutes both heels and calves feel tired, relieved by couple minutes rest.   He denies any known heart problems, denies chest pain, denies abdominal pain, states he feels bloated a great deal with gas. He states his dyspnea is improving slightly.  He denies any known back problems.   He reports known carpal tunnel syndrome in both hands, has tingling and numbness in both hands. He states that he has OSA, but is unable to tolerate C-PAP.    Diabetic: No Tobacco use: former smoker, quit in 2018, started at age 38 years   Pt meds include: Statin :Yes Betablocker: No ASA: No Other anticoagulants/antiplatelets: no  Past Medical History:  Diagnosis Date  . Anxiety    pt. reports that he " could swing from the ......" because he is so anxious about the surgery   .  Arthritis    R hand tendonitis   . COPD (chronic obstructive pulmonary disease) ([place])   . Depression   . Hyperlipidemia   . lung ca dx'd 10/2016  . Sleep apnea    cannot tolerate cpap    Social History Social History   Tobacco Use  . Smoking status: Former Smoker    Packs/day: 0.50    Types: Cigarettes    Last attempt to quit: 11/29/2016    Years since quitting: 1.7  . Smokeless tobacco: Never Used  Substance Use Topics  . Alcohol use: No  . Drug use: No    Family History Family History  Problem Relation Age of Onset  . Colon cancer Brother   . Emphysema Other   . Heart disease Other   . Cancer Other   . Heart disease Father   . Heart disease Maternal Uncle   . Heart disease Paternal Uncle     Past Surgical History:  Procedure Laterality Date  . COLONOSCOPY W/ POLYPECTOMY    . cyst back  2011  . CYSTOSCOPY  2012   for bleeding in bladder x 3  . MOUTH SURGERY    . VIDEO ASSISTED THORACOSCOPY (VATS)/ LOBECTOMY Left 12/12/2016   Procedure: VIDEO ASSISTED THORACOSCOPY (VATS)/LEFT UPPER LOBECTOMY;  Surgeon: [name], MD;  Location: [facility];  Service: Thoracic;  Laterality: Left;    Allergies  Allergen Reactions  . Nsaids Other (See Comments)  MD RESTRICTS USE OF ANY "BLOOD THINING" MEDS [ "DUE TO GROWTH IN BLADDER" ]   . Gadolinium Derivatives Nausea And Vomiting and Other (See Comments)    CHILLS SHAKING FEELING COLD  . Penicillins Other (See Comments)    "Feels like my skin is crawling"  Has patient had a PCN reaction causing immediate rash, facial/tongue/throat swelling, SOB or lightheadedness with hypotension: No Has patient had a PCN reaction causing severe rash involving mucus membranes or skin necrosis: No Has patient had a PCN reaction that required hospitalization No Has patient had a PCN reaction occurring within the last 10 years: No If all of the above answers are "NO", then may proceed with Cephalosporin use.     Current Outpatient  Medications  Medication Sig Dispense Refill  . albuterol (PROVENTIL HFA;VENTOLIN HFA) 108 (90 Base) MCG/ACT inhaler Inhale 2 puffs into the lungs every 6 (six) hours as needed for wheezing or shortness of breath.     Marland Kitchen BREO ELLIPTA 200-25 MCG/INH AEPB Inhale 1 Inhaler into the lungs daily.  2  . fexofenadine (ALLEGRA) 180 MG tablet Take 180 mg by mouth daily as needed for allergies or rhinitis.    Marland Kitchen montelukast (SINGULAIR) 10 MG tablet Take 10 mg by mouth at bedtime.    . sertraline (ZOLOFT) 50 MG tablet Take 1 tablet by mouth at bedtime.  5  . simvastatin (ZOCOR) 20 MG tablet Take 20 mg by mouth every evening.    . traZODone (DESYREL) 50 MG tablet TK 1 T PO HS  1   No current facility-administered medications for this visit.     ROS: See HPI for pertinent positives and negatives.   Physical Examination  Vitals:   08/24/18 1342  BP: 130/77  Pulse: 87  Resp: 18  Temp: (!) 97 F (36.1 C)  TempSrc: Oral  SpO2: 95%  Weight: 194 lb (88 kg)  Height: 5\' 6"  (1.676 m)   Body mass index is 31.31 kg/m.  General: A&O x 3, WDWN, obese male. Gait: normal HENT: Large short neck  Eyes: PERRLA. Pulmonary: Respirations are non labored at rest, limited air movement in all right posterior fields, few wheezes, no rales or rhonchi Cardiac: regular rhythm, no detected murmur.         Carotid Bruits Right Left   Negative Negative   Radial pulses are 2+ palpable bilaterally   Adominal aortic pulse is not palpable                         VASCULAR EXAM: Extremities without ischemic changes, without Gangrene; without open wounds.                                                                                                          LE Pulses Right Left       FEMORAL  2+ palpable  2+ palpable        POPLITEAL  2+ palpable   2+ palpable       POSTERIOR TIBIAL  2+ palpable   2+ palpable  DORSALIS PEDIS      ANTERIOR TIBIAL not palpable  not palpable    Abdomen: soft, NT, no  palpable masses. Skin: no rashes, no cellulitis, no ulcers noted. Musculoskeletal: no muscle wasting or atrophy.  Neurologic: A&O X 3; appropriate affect, Sensation is normal; MOTOR FUNCTION:  moving all extremities equally, motor strength 5/5 throughout. Speech is fluent/normal. CN 2-12 intact. Psychiatric: Thought content is normal, mood appropriate for clinical situation.     ASSESSMENT: [name] is a 78 y.o. male who has bilateral calf and achilles heel pain after walking 5 minutes. Bilateral femoral popliteal, and posterior tibial pedal pulses are 2+ palpable.   Normal bilateral ABI's and TBI's with all triphasic waveforms, no drop in pressure with walking.  His bilateral calf pain after walking 5 minutes is not due to lack of arterial perfusion. Consider back evaluation, or deconditioning as etiology; defer to his PCP.   Dyspnea s/p partial lung resection: followed by oncology.    DATA  ABI (Date: 08/24/2018):  R:   ABI: 1.15 (was 1.21 on 03-01-18),   PT: tri  DP: tri  TBI:  0.75, toe pressure 101, (was 0.82)  L:   ABI: 1.16 (was 1.09),   PT: tri  DP: tri  TBI: 0.75, toe pressure 105, (was 0.77) Pt walked at a brisk pace for 3 minutes before becoming short of breath.  Post exercise ABI's were also >1.0 bilaterally. Normal resting and exercise ABI's with all triphasic waveforms. Normal bilateral TBI.     PLAN:  Based on the patient's vascular studies and examination, pt will return to clinic as needed.   I discussed in depth with the patient the nature of atherosclerosis, and emphasized the importance of maximal medical management including strict control of blood pressure, blood glucose, and lipid levels, obtaining regular exercise, and continued cessation of smoking.  The patient is aware that without maximal medical management the underlying atherosclerotic disease process will progress, limiting the benefit of any interventions.   Clemon Chambers,  RN, MSN, FNP-C [facility] Phone: [phone]  Clinic MD: Donzetta Matters  08/24/18 1:54 PM

## 2018-08-29 DIAGNOSIS — R0901 Asphyxia: Secondary | ICD-10-CM | POA: Diagnosis not present

## 2018-08-29 DIAGNOSIS — J449 Chronic obstructive pulmonary disease, unspecified: Secondary | ICD-10-CM | POA: Diagnosis not present

## 2018-08-29 DIAGNOSIS — E7849 Other hyperlipidemia: Secondary | ICD-10-CM | POA: Diagnosis not present

## 2018-08-29 DIAGNOSIS — G4733 Obstructive sleep apnea (adult) (pediatric): Secondary | ICD-10-CM | POA: Diagnosis not present

## 2018-08-29 DIAGNOSIS — C3490 Malignant neoplasm of unspecified part of unspecified bronchus or lung: Secondary | ICD-10-CM | POA: Diagnosis not present

## 2018-08-29 DIAGNOSIS — F329 Major depressive disorder, single episode, unspecified: Secondary | ICD-10-CM | POA: Diagnosis not present

## 2018-08-29 DIAGNOSIS — I10 Essential (primary) hypertension: Secondary | ICD-10-CM | POA: Diagnosis not present

## 2018-08-29 DIAGNOSIS — E668 Other obesity: Secondary | ICD-10-CM | POA: Diagnosis not present

## 2018-08-29 DIAGNOSIS — Z1389 Encounter for screening for other disorder: Secondary | ICD-10-CM | POA: Diagnosis not present

## 2018-08-29 DIAGNOSIS — Z0000 Encounter for general adult medical examination without abnormal findings: Secondary | ICD-10-CM | POA: Diagnosis not present

## 2018-08-29 DIAGNOSIS — Z6832 Body mass index (BMI) 32.0-32.9, adult: Secondary | ICD-10-CM | POA: Diagnosis not present

## 2018-08-29 DIAGNOSIS — F17200 Nicotine dependence, unspecified, uncomplicated: Secondary | ICD-10-CM | POA: Diagnosis not present

## 2018-09-05 DIAGNOSIS — Z1212 Encounter for screening for malignant neoplasm of rectum: Secondary | ICD-10-CM | POA: Diagnosis not present

## 2018-10-29 ENCOUNTER — Other Ambulatory Visit: Payer: Self-pay

## 2018-10-29 ENCOUNTER — Observation Stay (HOSPITAL_COMMUNITY)
Admission: EM | Admit: 2018-10-29 | Discharge: 2018-10-30 | Disposition: A | Discharge status: Home / Self Care | Payer: Medicare Other | Attending: Internal Medicine | Admitting: Internal Medicine

## 2018-10-29 ENCOUNTER — Encounter (HOSPITAL_COMMUNITY): Payer: Self-pay

## 2018-10-29 ENCOUNTER — Emergency Department (HOSPITAL_COMMUNITY): Payer: Medicare Other

## 2018-10-29 DIAGNOSIS — F17200 Nicotine dependence, unspecified, uncomplicated: Secondary | ICD-10-CM | POA: Diagnosis not present

## 2018-10-29 DIAGNOSIS — R000 Tachycardia, unspecified: Secondary | ICD-10-CM | POA: Diagnosis not present

## 2018-10-29 DIAGNOSIS — J441 Chronic obstructive pulmonary disease with (acute) exacerbation: Secondary | ICD-10-CM | POA: Diagnosis not present

## 2018-10-29 DIAGNOSIS — I4510 Unspecified right bundle-branch block: Secondary | ICD-10-CM | POA: Insufficient documentation

## 2018-10-29 DIAGNOSIS — J101 Influenza due to other identified influenza virus with other respiratory manifestations: Secondary | ICD-10-CM | POA: Diagnosis not present

## 2018-10-29 DIAGNOSIS — R0901 Asphyxia: Secondary | ICD-10-CM | POA: Diagnosis not present

## 2018-10-29 DIAGNOSIS — R0602 Shortness of breath: Secondary | ICD-10-CM | POA: Diagnosis not present

## 2018-10-29 DIAGNOSIS — J9601 Acute respiratory failure with hypoxia: Secondary | ICD-10-CM | POA: Diagnosis present

## 2018-10-29 DIAGNOSIS — Z8249 Family history of ischemic heart disease and other diseases of the circulatory system: Secondary | ICD-10-CM | POA: Insufficient documentation

## 2018-10-29 DIAGNOSIS — F329 Major depressive disorder, single episode, unspecified: Secondary | ICD-10-CM | POA: Diagnosis not present

## 2018-10-29 DIAGNOSIS — J189 Pneumonia, unspecified organism: Secondary | ICD-10-CM | POA: Diagnosis not present

## 2018-10-29 DIAGNOSIS — M1990 Unspecified osteoarthritis, unspecified site: Secondary | ICD-10-CM | POA: Insufficient documentation

## 2018-10-29 DIAGNOSIS — J449 Chronic obstructive pulmonary disease, unspecified: Secondary | ICD-10-CM | POA: Diagnosis not present

## 2018-10-29 DIAGNOSIS — J209 Acute bronchitis, unspecified: Secondary | ICD-10-CM | POA: Diagnosis not present

## 2018-10-29 DIAGNOSIS — I70213 Atherosclerosis of native arteries of extremities with intermittent claudication, bilateral legs: Secondary | ICD-10-CM | POA: Diagnosis not present

## 2018-10-29 DIAGNOSIS — Z85118 Personal history of other malignant neoplasm of bronchus and lung: Secondary | ICD-10-CM | POA: Insufficient documentation

## 2018-10-29 DIAGNOSIS — Z7952 Long term (current) use of systemic steroids: Secondary | ICD-10-CM | POA: Diagnosis not present

## 2018-10-29 DIAGNOSIS — Z9189 Other specified personal risk factors, not elsewhere classified: Secondary | ICD-10-CM | POA: Diagnosis present

## 2018-10-29 DIAGNOSIS — J440 Chronic obstructive pulmonary disease with acute lower respiratory infection: Secondary | ICD-10-CM | POA: Diagnosis not present

## 2018-10-29 DIAGNOSIS — A419 Sepsis, unspecified organism: Principal | ICD-10-CM | POA: Insufficient documentation

## 2018-10-29 DIAGNOSIS — C3490 Malignant neoplasm of unspecified part of unspecified bronchus or lung: Secondary | ICD-10-CM | POA: Diagnosis not present

## 2018-10-29 DIAGNOSIS — Z79899 Other long term (current) drug therapy: Secondary | ICD-10-CM | POA: Diagnosis not present

## 2018-10-29 DIAGNOSIS — G4733 Obstructive sleep apnea (adult) (pediatric): Secondary | ICD-10-CM | POA: Insufficient documentation

## 2018-10-29 DIAGNOSIS — Z902 Acquired absence of lung [part of]: Secondary | ICD-10-CM | POA: Diagnosis not present

## 2018-10-29 DIAGNOSIS — G4730 Sleep apnea, unspecified: Secondary | ICD-10-CM | POA: Diagnosis present

## 2018-10-29 DIAGNOSIS — I10 Essential (primary) hypertension: Secondary | ICD-10-CM | POA: Diagnosis not present

## 2018-10-29 DIAGNOSIS — J09X2 Influenza due to identified novel influenza A virus with other respiratory manifestations: Secondary | ICD-10-CM | POA: Diagnosis not present

## 2018-10-29 DIAGNOSIS — F419 Anxiety disorder, unspecified: Secondary | ICD-10-CM | POA: Diagnosis not present

## 2018-10-29 DIAGNOSIS — E785 Hyperlipidemia, unspecified: Secondary | ICD-10-CM | POA: Diagnosis not present

## 2018-10-29 DIAGNOSIS — R509 Fever, unspecified: Secondary | ICD-10-CM | POA: Diagnosis not present

## 2018-10-29 LAB — URINALYSIS, ROUTINE W REFLEX MICROSCOPIC
Bilirubin Urine: NEGATIVE
Glucose, UA: NEGATIVE mg/dL
Ketones, ur: 5 mg/dL — AB
Nitrite: NEGATIVE
PROTEIN: 30 mg/dL — AB
Specific Gravity, Urine: 1.025 (ref 1.005–1.030)
WBC, UA: >50 WBC/hpf — ABNORMAL HIGH (ref 0–5)
pH: 5 (ref 5.0–8.0)

## 2018-10-29 LAB — PROTIME-INR
INR: 1.1 (ref 0.8–1.2)
Prothrombin Time: 14.2 seconds (ref 11.4–15.2)

## 2018-10-29 LAB — CBC WITH DIFFERENTIAL/PLATELET
ABS IMMATURE GRANULOCYTES: 0.21 10*3/uL — AB (ref 0.00–0.07)
BASOS PCT: 0 %
Basophils Absolute: 0 10*3/uL (ref 0.0–0.1)
Eosinophils Absolute: 0.1 10*3/uL (ref 0.0–0.5)
Eosinophils Relative: 0 %
HEMATOCRIT: 45.9 % (ref 39.0–52.0)
HEMOGLOBIN: 14.8 g/dL (ref 13.0–17.0)
Immature Granulocytes: 1 %
LYMPHS ABS: 0.3 10*3/uL — AB (ref 0.7–4.0)
Lymphocytes Relative: 2 %
MCH: 31.6 pg (ref 26.0–34.0)
MCHC: 32.2 g/dL (ref 30.0–36.0)
MCV: 98.1 fL (ref 80.0–100.0)
MONO ABS: 1.4 10*3/uL — AB (ref 0.1–1.0)
MONOS PCT: 8 %
NEUTROS ABS: 16.4 10*3/uL — AB (ref 1.7–7.7)
Neutrophils Relative %: 89 %
PLATELETS: 248 10*3/uL (ref 150–400)
RBC: 4.68 MIL/uL (ref 4.22–5.81)
RDW: 14 % (ref 11.5–15.5)
WBC: 18.5 10*3/uL — ABNORMAL HIGH (ref 4.0–10.5)
nRBC: 0 % (ref 0.0–0.2)

## 2018-10-29 LAB — COMPREHENSIVE METABOLIC PANEL
ALK PHOS: 60 U/L (ref 38–126)
ALT: 19 U/L (ref 0–44)
AST: 20 U/L (ref 15–41)
Albumin: 3.8 g/dL (ref 3.5–5.0)
Anion gap: 10 (ref 5–15)
BUN: 19 mg/dL (ref 8–23)
CALCIUM: 8.8 mg/dL — AB (ref 8.9–10.3)
CO2: 27 mmol/L (ref 22–32)
CREATININE: 0.76 mg/dL (ref 0.61–1.24)
Chloride: 99 mmol/L (ref 98–111)
Glucose, Bld: 129 mg/dL — ABNORMAL HIGH (ref 70–99)
Potassium: 4 mmol/L (ref 3.5–5.1)
SODIUM: 136 mmol/L (ref 135–145)
Total Bilirubin: 0.7 mg/dL (ref 0.3–1.2)
Total Protein: 7.3 g/dL (ref 6.5–8.1)

## 2018-10-29 LAB — LACTIC ACID, PLASMA
LACTIC ACID, VENOUS: 1 mmol/L (ref 0.5–1.9)
Lactic Acid, Venous: 1.6 mmol/L (ref 0.5–1.9)

## 2018-10-29 LAB — INFLUENZA PANEL BY PCR (TYPE A & B)
INFLAPCR: POSITIVE — AB
Influenza B By PCR: NEGATIVE

## 2018-10-29 MED ORDER — SODIUM CHLORIDE 0.9 % IV SOLN
1.0000 g | INTRAVENOUS | Status: DC
  Filled 2018-10-29: qty 10

## 2018-10-29 MED ORDER — ACETAMINOPHEN 325 MG PO TABS
650.0000 mg | ORAL_TABLET | Freq: Once | ORAL | Status: AC
  Administered 2018-10-29: 650 mg via ORAL
  Filled 2018-10-29: qty 2

## 2018-10-29 MED ORDER — SODIUM CHLORIDE 0.9 % IV SOLN
500.0000 mg | Freq: Once | INTRAVENOUS | Status: AC
  Administered 2018-10-29: 500 mg via INTRAVENOUS
  Filled 2018-10-29: qty 500

## 2018-10-29 MED ORDER — METHYLPREDNISOLONE SODIUM SUCC 40 MG IJ SOLR
40.0000 mg | Freq: Two times a day (BID) | INTRAMUSCULAR | Status: DC
  Administered 2018-10-30: 40 mg via INTRAVENOUS
  Filled 2018-10-29: qty 1

## 2018-10-29 MED ORDER — SERTRALINE HCL 100 MG PO TABS
100.0000 mg | ORAL_TABLET | Freq: Every day | ORAL | Status: DC
  Administered 2018-10-30: 100 mg via ORAL
  Filled 2018-10-29: qty 1

## 2018-10-29 MED ORDER — SODIUM CHLORIDE 0.9 % IV SOLN
1.0000 g | Freq: Once | INTRAVENOUS | Status: AC
  Administered 2018-10-29: 1 g via INTRAVENOUS
  Filled 2018-10-29: qty 10

## 2018-10-29 MED ORDER — ENOXAPARIN SODIUM 40 MG/0.4ML ~~LOC~~ SOLN
40.0000 mg | SUBCUTANEOUS | Status: DC
  Administered 2018-10-29: 40 mg via SUBCUTANEOUS
  Filled 2018-10-29: qty 0.4

## 2018-10-29 MED ORDER — PREDNISONE 20 MG PO TABS
60.0000 mg | ORAL_TABLET | Freq: Once | ORAL | Status: AC
  Administered 2018-10-29: 60 mg via ORAL
  Filled 2018-10-29: qty 3

## 2018-10-29 MED ORDER — LACTATED RINGERS IV BOLUS
1000.0000 mL | Freq: Once | INTRAVENOUS | Status: AC
  Administered 2018-10-29: 1000 mL via INTRAVENOUS

## 2018-10-29 MED ORDER — IPRATROPIUM-ALBUTEROL 0.5-2.5 (3) MG/3ML IN SOLN
3.0000 mL | Freq: Once | RESPIRATORY_TRACT | Status: AC
  Administered 2018-10-29: 3 mL via RESPIRATORY_TRACT
  Filled 2018-10-29: qty 3

## 2018-10-29 MED ORDER — SODIUM CHLORIDE 0.9 % IV SOLN
500.0000 mg | INTRAVENOUS | Status: DC
  Filled 2018-10-29: qty 500

## 2018-10-29 MED ORDER — ALBUTEROL SULFATE (2.5 MG/3ML) 0.083% IN NEBU
2.5000 mg | INHALATION_SOLUTION | RESPIRATORY_TRACT | Status: DC
  Filled 2018-10-29: qty 3

## 2018-10-29 MED ORDER — ACETAMINOPHEN 325 MG PO TABS
650.0000 mg | ORAL_TABLET | Freq: Four times a day (QID) | ORAL | Status: DC | PRN
  Administered 2018-10-30: 650 mg via ORAL
  Filled 2018-10-29: qty 2

## 2018-10-29 MED ORDER — ONDANSETRON HCL 4 MG/2ML IJ SOLN: 4.0000 mg | Freq: Four times a day (QID) | INTRAMUSCULAR | Status: DC | PRN

## 2018-10-29 MED ORDER — MONTELUKAST SODIUM 10 MG PO TABS
10.0000 mg | ORAL_TABLET | Freq: Every day | ORAL | Status: DC
  Administered 2018-10-29: 10 mg via ORAL
  Filled 2018-10-29: qty 1

## 2018-10-29 MED ORDER — ALBUTEROL SULFATE (2.5 MG/3ML) 0.083% IN NEBU: 2.5000 mg | INHALATION_SOLUTION | RESPIRATORY_TRACT | Status: DC | PRN

## 2018-10-29 MED ORDER — SODIUM CHLORIDE 0.9% FLUSH
3.0000 mL | Freq: Once | INTRAVENOUS | Status: AC
  Administered 2018-10-29: 3 mL via INTRAVENOUS

## 2018-10-29 MED ORDER — ACETAMINOPHEN 650 MG RE SUPP: 650.0000 mg | Freq: Four times a day (QID) | RECTAL | Status: DC | PRN

## 2018-10-29 MED ORDER — TRAZODONE HCL 50 MG PO TABS
50.0000 mg | ORAL_TABLET | Freq: Every day | ORAL | Status: DC
  Administered 2018-10-29: 50 mg via ORAL
  Filled 2018-10-29: qty 1

## 2018-10-29 MED ORDER — ALBUTEROL SULFATE HFA 108 (90 BASE) MCG/ACT IN AERS
6.0000 | INHALATION_SPRAY | Freq: Once | RESPIRATORY_TRACT | Status: AC
  Administered 2018-10-29: 6 via RESPIRATORY_TRACT
  Filled 2018-10-29: qty 6.7

## 2018-10-29 MED ORDER — OSELTAMIVIR PHOSPHATE 75 MG PO CAPS
75.0000 mg | ORAL_CAPSULE | Freq: Two times a day (BID) | ORAL | Status: DC
  Administered 2018-10-30: 75 mg via ORAL
  Filled 2018-10-29: qty 1

## 2018-10-29 MED ORDER — SIMVASTATIN 20 MG PO TABS
20.0000 mg | ORAL_TABLET | Freq: Every evening | ORAL | Status: DC
  Administered 2018-10-29: 20 mg via ORAL
  Filled 2018-10-29: qty 1

## 2018-10-29 MED ORDER — OSELTAMIVIR PHOSPHATE 75 MG PO CAPS
75.0000 mg | ORAL_CAPSULE | Freq: Once | ORAL | Status: AC
  Administered 2018-10-29: 75 mg via ORAL
  Filled 2018-10-29: qty 1

## 2018-10-29 MED ORDER — ONDANSETRON HCL 4 MG PO TABS: 4.0000 mg | ORAL_TABLET | Freq: Four times a day (QID) | ORAL | Status: DC | PRN

## 2018-10-29 MED ORDER — FLUTICASONE FUROATE-VILANTEROL 200-25 MCG/INH IN AEPB
1.0000 | INHALATION_SPRAY | Freq: Every day | RESPIRATORY_TRACT | Status: DC
  Administered 2018-10-30: 1 via RESPIRATORY_TRACT
  Filled 2018-10-29: qty 28

## 2018-10-29 NOTE — ED Provider Notes (Signed)
[facility] DEPT Provider Note   CSN: [id number] Arrival date & time: 10/29/18  1613    History   Chief Complaint Chief Complaint  Patient presents with  . Influenza  . Pneumonia    HPI TAJ NEVINS is a 78 y.o. male.     The history is provided by the patient.  Influenza  Presenting symptoms: cough, fever and myalgias   Presenting symptoms: no shortness of breath, no sore throat and no vomiting   Severity:  Mild Onset quality:  Gradual Duration:  4 days Progression:  Unchanged Chronicity:  New Relieved by:  Nothing Worsened by:  Nothing Associated symptoms: chills   Associated symptoms: no ear pain   Risk factors: being elderly   Risk factors comment:  Hx of lung disease, COPD  Pneumonia  Pertinent negatives include no chest pain, no abdominal pain and no shortness of breath.    Past Medical History:  Diagnosis Date  . Anxiety    pt. reports that he " could swing from the ......" because he is so anxious about the surgery   . Arthritis    R hand tendonitis   . COPD (chronic obstructive pulmonary disease) ([place])   . Depression   . Hyperlipidemia   . lung ca dx'd 10/2016  . Sleep apnea    cannot tolerate cpap    Patient Active Problem List   Diagnosis Date Noted  . Atherosclerosis of native artery of both lower extremities with intermittent claudication ([place]) 03/01/2018  . Former tobacco use 03/01/2018  . PVD (peripheral vascular disease) ([place]) 03/01/2018  . Stage I squamous cell carcinoma of left lung ([place]) 01/19/2017  . Mass of upper lobe of left lung 12/12/2016  . Lung nodule 11/07/2016  . Thrush 06/30/2013  . OBSTRUCTIVE SLEEP APNEA 06/11/2008  . TOBACCO USER 04/09/2008  . ALLERGIC RHINITIS 04/09/2008  . ASTHMA 04/09/2008  . COPD (chronic obstructive pulmonary disease) ([place]) 04/09/2008    Past Surgical History:  Procedure Laterality Date  . COLONOSCOPY W/ POLYPECTOMY    . cyst back  2011  . CYSTOSCOPY  2012   for bleeding in bladder x 3  . MOUTH SURGERY    . VIDEO ASSISTED THORACOSCOPY (VATS)/ LOBECTOMY Left 12/12/2016   Procedure: VIDEO ASSISTED THORACOSCOPY (VATS)/LEFT UPPER LOBECTOMY;  Surgeon: [name], MD;  Location: [facility];  Service: Thoracic;  Laterality: Left;        Home Medications    Prior to Admission medications   Medication Sig Start Date End Date Taking? Authorizing Provider  acetaminophen (TYLENOL) 500 MG tablet Take 1,000 mg by mouth daily as needed for mild pain or fever.   Yes [provider]  BREO ELLIPTA 200-25 MCG/INH AEPB Inhale 1 Inhaler into the lungs daily. 09/30/16  Yes Avva, Ravisankar, MD  fexofenadine (ALLEGRA) 180 MG tablet Take 180 mg by mouth daily as needed for allergies or rhinitis.   Yes [provider]  montelukast (SINGULAIR) 10 MG tablet Take 10 mg by mouth at bedtime.   Yes [provider]  predniSONE (DELTASONE) 20 MG tablet Take 20 mg by mouth daily. taper 10/27/18  Yes [provider]  sertraline (ZOLOFT) 100 MG tablet Take 100 mg by mouth daily. 08/27/18  Yes [provider]  simvastatin (ZOCOR) 20 MG tablet Take 20 mg by mouth every evening.   Yes [provider]  traZODone (DESYREL) 50 MG tablet Take 50 mg by mouth at bedtime.  07/18/18  Yes [provider]  Family History Family History  Problem Relation Age of Onset  . Colon cancer Brother   . Emphysema Other   . Heart disease Other   . Cancer Other   . Heart disease Father   . Heart disease Maternal Uncle   . Heart disease Paternal Uncle     Social History Social History   Tobacco Use  . Smoking status: Former Smoker    Packs/day: 0.50    Types: Cigarettes    Last attempt to quit: 11/29/2016    Years since quitting: 1.9  . Smokeless tobacco: Never Used  Substance Use Topics  . Alcohol use: No  . Drug use: No     Allergies   Nsaids; Gadolinium derivatives; and Penicillins   Review of Systems Review  of Systems  Constitutional: Positive for chills and fever.  HENT: Negative for ear pain and sore throat.   Eyes: Negative for pain and visual disturbance.  Respiratory: Positive for cough. Negative for shortness of breath.   Cardiovascular: Negative for chest pain and palpitations.  Gastrointestinal: Negative for abdominal pain and vomiting.  Genitourinary: Negative for dysuria and hematuria.  Musculoskeletal: Positive for myalgias. Negative for arthralgias and back pain.  Skin: Negative for color change and rash.  Neurological: Negative for seizures and syncope.  All other systems reviewed and are negative.    Physical Exam Updated Vital Signs  ED Triage Vitals  Enc Vitals Group     BP 10/29/18 1629 (!) 141/66     Pulse Rate 10/29/18 1629 97     Resp 10/29/18 1629 (!) 22     Temp 10/29/18 1629 (!) 100.4 F (38 C)     Temp src --      SpO2 10/29/18 1621 94 %     Weight 10/29/18 1630 193 lb (87.5 kg)     Height 10/29/18 1630 5\' 6"  (1.676 m)     Head Circumference --      Peak Flow --      Pain Score 10/29/18 1630 8     Pain Loc --      Pain Edu? --      Excl. in [place]? --     Physical Exam Vitals signs and nursing note reviewed.  Constitutional:      General: He is not in acute distress.    Appearance: He is well-developed. He is not ill-appearing.  HENT:     Head: Normocephalic and atraumatic.     Mouth/Throat:     Mouth: Mucous membranes are moist.     Pharynx: No oropharyngeal exudate.  Eyes:     Extraocular Movements: Extraocular movements intact.     Conjunctiva/sclera: Conjunctivae normal.     Pupils: Pupils are equal, round, and reactive to light.  Neck:     Musculoskeletal: Normal range of motion and neck supple.  Cardiovascular:     Rate and Rhythm: Normal rate and regular rhythm.     Pulses: Normal pulses.     Heart sounds: Normal heart sounds. No murmur.  Pulmonary:     Effort: No respiratory distress.     Breath sounds: Wheezing present.      Comments: Increased work of breathing, diminished breath sounds throughout Abdominal:     Palpations: Abdomen is soft.     Tenderness: There is no abdominal tenderness.  Musculoskeletal: Normal range of motion.  Skin:    General: Skin is warm and dry.     Capillary Refill: Capillary refill takes less than 2 seconds.  Neurological:  General: No focal deficit present.     Mental Status: He is alert.  Psychiatric:        Mood and Affect: Mood normal.      ED Treatments / Results  Labs (all labs ordered are listed, but only abnormal results are displayed) Labs Reviewed  COMPREHENSIVE METABOLIC PANEL - Abnormal; Notable for the following components:      Result Value   Glucose, Bld 129 (*)    Calcium 8.8 (*)    All other components within normal limits  CBC WITH DIFFERENTIAL/PLATELET - Abnormal; Notable for the following components:   WBC 18.5 (*)    Neutro Abs 16.4 (*)    Lymphs Abs 0.3 (*)    Monocytes Absolute 1.4 (*)    Abs Immature Granulocytes 0.21 (*)    All other components within normal limits  URINALYSIS, ROUTINE W REFLEX MICROSCOPIC - Abnormal; Notable for the following components:   APPearance HAZY (*)    Hgb urine dipstick MODERATE (*)    Ketones, ur 5 (*)    Protein, ur 30 (*)    Leukocytes,Ua LARGE (*)    WBC, UA >50 (*)    Bacteria, UA RARE (*)    Crystals PRESENT (*)    All other components within normal limits  INFLUENZA PANEL BY PCR (TYPE A & B) - Abnormal; Notable for the following components:   Influenza A By PCR POSITIVE (*)    All other components within normal limits  CULTURE, BLOOD (ROUTINE X 2)  CULTURE, BLOOD (ROUTINE X 2)  URINE CULTURE  LACTIC ACID, PLASMA  PROTIME-INR  LACTIC ACID, PLASMA    EKG EKG Interpretation  Date/Time:  Monday October 29 2018 17:45:28 EDT Ventricular Rate:  93 PR Interval:    QRS Duration: 134 QT Interval:  385 QTC Calculation: 479 R Axis:   56 Text Interpretation:  Sinus rhythm Right bundle branch block  Similar to prior. No STEMI.  Confirmed by Nanda Quinton [phone]) on 10/29/2018 6:05:15 PM   Radiology Dg Chest Port 1 View  Result Date: 10/29/2018 CLINICAL DATA:  Shortness of breath since Friday, nausea, history of influenza EXAM: PORTABLE CHEST 1 VIEW COMPARISON:  CT chest 07/20/2018 FINDINGS: Mild bilateral interstitial thickening. No pleural effusion or pneumothorax. Prior left upper lobectomy with postsurgical changes. No focal consolidation. Stable cardiomegaly. No acute osseous abnormality. IMPRESSION: No acute cardiopulmonary disease. Electronically Signed   By: Kathreen Devoid   On: 10/29/2018 18:19    Procedures .Critical Care Performed by: Lennice Sites, DO Authorized by: Lennice Sites, DO   Critical care provider statement:    Critical care time (minutes):  45   Critical care time was exclusive of:  Separately billable procedures and treating other patients and teaching time   Critical care was necessary to treat or prevent imminent or life-threatening deterioration of the following conditions:  Sepsis and respiratory failure   Critical care was time spent personally by me on the following activities:  Blood draw for specimens, discussions with primary provider, evaluation of patient's response to treatment, development of treatment plan with patient or surrogate, examination of patient, obtaining history from patient or surrogate, ordering and performing treatments and interventions, ordering and review of laboratory studies, ordering and review of radiographic studies, pulse oximetry, re-evaluation of patient's condition and review of old charts   I assumed direction of critical care for this patient from another provider in my specialty: no     (including critical care time)  Medications Ordered in ED Medications  sodium chloride flush (NS) 0.9 % injection 3 mL (has no administration in time range)  ipratropium-albuterol (DUONEB) 0.5-2.5 (3) MG/3ML nebulizer solution 3 mL (has  no administration in time range)  cefTRIAXone (ROCEPHIN) 1 g in sodium chloride 0.9 % 100 mL IVPB (0 g Intravenous Stopped 10/29/18 1849)  azithromycin (ZITHROMAX) 500 mg in sodium chloride 0.9 % 250 mL IVPB (0 mg Intravenous Stopped 10/29/18 1849)  oseltamivir (TAMIFLU) capsule 75 mg (75 mg Oral Given 10/29/18 1746)  lactated ringers bolus 1,000 mL (0 mLs Intravenous Stopped 10/29/18 1849)  acetaminophen (TYLENOL) tablet 650 mg (650 mg Oral Given 10/29/18 1746)  albuterol (PROVENTIL HFA;VENTOLIN HFA) 108 (90 Base) MCG/ACT inhaler 6 puff (6 puffs Inhalation Given 10/29/18 1741)  predniSONE (DELTASONE) tablet 60 mg (60 mg Oral Given 10/29/18 1746)     Initial Impression / Assessment and Plan / ED Course  I have reviewed the triage vital signs and the nursing notes.  Pertinent labs & imaging results that were available during my care of the patient were reviewed by me and considered in my medical decision making (see chart for details).       TYLLER BOWLBY is a 78 year old male with history of lung cancer, COPD who presents to the ED with shortness of breath.  Patient with fever, tachypnea upon arrival.  Pulse ox is normal with patient on his home 3 L of oxygen.  Diagnosed with influenza and possible pneumonia today.  Patient not currently undergoing cancer treatment.  He has had increased shortness of breath, cough, sputum production, fever.  Symptoms have ongoing for the last 3 to 4 days.  He has increased work of breathing on exam.  Coarse breath sounds and wheezing throughout.  Suspect patient likely has influenza causing COPD exacerbation.  Sepsis work-up was initiated.  Patient empirically given Rocephin and Zithromax.  Given Tamiflu, Tylenol, albuterol treatment, prednisone.  Patient positive for influenza a.  Chest x-ray shows no obvious pneumonia.  Urinalysis concerning for possible infection as well.  Urine culture is pending.  Patient with leukocytosis of 18. Lactic acid wnl. Otherwise no  significant electrolyte abnormality, kidney injury.  Overall patient appears to be septic from pneumonia causing COPD exacerbation.  Feels slightly improved following breathing treatment.  Will admit for further care.  Hemodynamically stable throughout my care.  This chart was dictated using voice recognition software.  Despite best efforts to proofread,  errors can occur which can change the documentation meaning.    Final Clinical Impressions(s) / ED Diagnoses   Final diagnoses:  Sepsis, due to unspecified organism, unspecified whether acute organ dysfunction present [facility])  Influenza A  COPD exacerbation [facility])    ED Discharge Orders    None       Lennice Sites, DO 10/29/18 1852

## 2018-10-29 NOTE — ED Notes (Signed)
ED TO INPATIENT HANDOFF REPORT  ED Nurse Name and Phone #: Vicente Males [phone]   S Name/Age/Gender [name] 78 y.o. male Room/Bed: WA25/WA25  Code Status   Code Status: Prior  Home/SNF/Other Home Patient oriented to: self, place, time and situation Is this baseline? Yes   Triage Complete: Triage complete  Chief Complaint Oncology Pt./Flu-A Positive/Pneumonia   Triage Note Per EMS, Pt is coming from PCP. Pt is Flu A pos, and positive for pneumonia in right lower lobe. SOB since Friday, some nausea. Worsening symptoms since then.   Allergies Allergies  Allergen Reactions  . Nsaids Other (See Comments)    MD RESTRICTS USE OF ANY "BLOOD THINING" MEDS [ "DUE TO GROWTH IN BLADDER" ]   . Gadolinium Derivatives Nausea And Vomiting and Other (See Comments)    CHILLS SHAKING FEELING COLD  . Penicillins Other (See Comments)    "Feels like my skin is crawling"  Has patient had a PCN reaction causing immediate rash, facial/tongue/throat swelling, SOB or lightheadedness with hypotension: No Has patient had a PCN reaction causing severe rash involving mucus membranes or skin necrosis: No Has patient had a PCN reaction that required hospitalization No Has patient had a PCN reaction occurring within the last 10 years: No If all of the above answers are "NO", then may proceed with Cephalosporin use.     Level of Care/Admitting Diagnosis ED Disposition    ED Disposition Condition Comment   Admit  Hospital Area: [facility] [[id number]]  Level of Care: Telemetry [5]  Admit to tele based on following criteria: Monitor for Ischemic changes  Diagnosis: Acute bronchitis [466.0.ICD-9-CM]  Admitting Physician: Rise Patience [phone]  Attending Physician: Rise Patience [phone]  PT Class (Do Not Modify): Observation [104]  PT Acc Code (Do Not Modify): Observation [10022]       B Medical/Surgery History Past Medical History:  Diagnosis Date  . Anxiety     pt. reports that he " could swing from the ......" because he is so anxious about the surgery   . Arthritis    R hand tendonitis   . COPD (chronic obstructive pulmonary disease) ([place])   . Depression   . Hyperlipidemia   . lung ca dx'd 10/2016  . Sleep apnea    cannot tolerate cpap   Past Surgical History:  Procedure Laterality Date  . COLONOSCOPY W/ POLYPECTOMY    . cyst back  2011  . CYSTOSCOPY  2012   for bleeding in bladder x 3  . MOUTH SURGERY    . VIDEO ASSISTED THORACOSCOPY (VATS)/ LOBECTOMY Left 12/12/2016   Procedure: VIDEO ASSISTED THORACOSCOPY (VATS)/LEFT UPPER LOBECTOMY;  Surgeon: [name], MD;  Location: [facility];  Service: Thoracic;  Laterality: Left;     A IV Location/Drains/Wounds Patient Lines/Drains/Airways Status   Active Line/Drains/Airways    Name:   Placement date:   Placement time:   Site:   Days:   Peripheral IV 07/20/18 Right Antecubital   07/20/18    1317    Antecubital   101   Peripheral IV 10/29/18 Left Antecubital   10/29/18    1621    Antecubital   less than 1   Peripheral IV 10/29/18 Right Antecubital   10/29/18    1656    Antecubital   less than 1   Incision (Closed) 12/12/16 Chest Left   12/12/16    1133     686          Intake/Output Last  24 hours  Intake/Output Summary (Last 24 hours) at 10/29/2018 2031 Last data filed at 10/29/2018 1849 Gross per 24 hour  Intake 1350 ml  Output -  Net 1350 ml    Labs/Imaging Results for orders placed or performed during the hospital encounter of 10/29/18 (from the past 48 hour(s))  Comprehensive metabolic panel     Status: Abnormal   Collection Time: 10/29/18  4:40 PM  Result Value Ref Range   Sodium 136 135 - 145 mmol/L   Potassium 4.0 3.5 - 5.1 mmol/L   Chloride 99 98 - 111 mmol/L   CO2 27 22 - 32 mmol/L   Glucose, Bld 129 (H) 70 - 99 mg/dL   BUN 19 8 - 23 mg/dL   Creatinine, Ser 0.76 0.61 - 1.24 mg/dL   Calcium 8.8 (L) 8.9 - 10.3 mg/dL   Total Protein 7.3 6.5 - 8.1 g/dL   Albumin  3.8 3.5 - 5.0 g/dL   AST 20 15 - 41 U/L   ALT 19 0 - 44 U/L   Alkaline Phosphatase 60 38 - 126 U/L   Total Bilirubin 0.7 0.3 - 1.2 mg/dL   GFR calc non Af Amer >60 >60 mL/min   GFR calc Af Amer >60 >60 mL/min   Anion gap 10 5 - 15    Comment: Performed at [facility], [place] [address], [place], [place] [address]  Lactic acid, plasma     Status: None   Collection Time: 10/29/18  4:40 PM  Result Value Ref Range   Lactic Acid, Venous 1.6 0.5 - 1.9 mmol/L    Comment: Performed at [facility], [place] [address], [place], [place] [address]  CBC with Differential     Status: Abnormal   Collection Time: 10/29/18  4:40 PM  Result Value Ref Range   WBC 18.5 (H) 4.0 - 10.5 K/uL   RBC 4.68 4.22 - 5.81 MIL/uL   Hemoglobin 14.8 13.0 - 17.0 g/dL   HCT 45.9 39.0 - 52.0 %   MCV 98.1 80.0 - 100.0 fL   MCH 31.6 26.0 - 34.0 pg   MCHC 32.2 30.0 - 36.0 g/dL   RDW 14.0 11.5 - 15.5 %   Platelets 248 150 - 400 K/uL   nRBC 0.0 0.0 - 0.2 %   Neutrophils Relative % 89 %   Neutro Abs 16.4 (H) 1.7 - 7.7 K/uL   Lymphocytes Relative 2 %   Lymphs Abs 0.3 (L) 0.7 - 4.0 K/uL   Monocytes Relative 8 %   Monocytes Absolute 1.4 (H) 0.1 - 1.0 K/uL   Eosinophils Relative 0 %   Eosinophils Absolute 0.1 0.0 - 0.5 K/uL   Basophils Relative 0 %   Basophils Absolute 0.0 0.0 - 0.1 K/uL   Immature Granulocytes 1 %   Abs Immature Granulocytes 0.21 (H) 0.00 - 0.07 K/uL    Comment: Performed at [facility], [place] [address], [place], [place] [address]  Protime-INR     Status: None   Collection Time: 10/29/18  4:40 PM  Result Value Ref Range   Prothrombin Time 14.2 11.4 - 15.2 seconds   INR 1.1 0.8 - 1.2    Comment: (NOTE) INR goal varies based on device and disease states. Performed at [facility], [place] [address], [place], [place] [address]   Urinalysis, Routine w reflex microscopic     Status: Abnormal   Collection Time: 10/29/18  4:41 PM   Result Value Ref Range  Color, Urine YELLOW YELLOW   APPearance HAZY (A) CLEAR   Specific Gravity, Urine 1.025 1.005 - 1.030   pH 5.0 5.0 - 8.0   Glucose, UA NEGATIVE NEGATIVE mg/dL   Hgb urine dipstick MODERATE (A) NEGATIVE   Bilirubin Urine NEGATIVE NEGATIVE   Ketones, ur 5 (A) NEGATIVE mg/dL   Protein, ur 30 (A) NEGATIVE mg/dL   Nitrite NEGATIVE NEGATIVE   Leukocytes,Ua LARGE (A) NEGATIVE   RBC / HPF 6-10 0 - 5 RBC/hpf   WBC, UA >50 (H) 0 - 5 WBC/hpf   Bacteria, UA RARE (A) NONE SEEN   Squamous Epithelial / LPF 0-5 0 - 5   Mucus PRESENT    Crystals PRESENT (A) NEGATIVE    Comment: Performed at [facility], [place] [address], [place], [place] [address]  Influenza panel by PCR (type A & B)     Status: Abnormal   Collection Time: 10/29/18  4:55 PM  Result Value Ref Range   Influenza A By PCR POSITIVE (A) NEGATIVE   Influenza B By PCR NEGATIVE NEGATIVE    Comment: (NOTE) The Xpert Xpress Flu assay is intended as an aid in the diagnosis of  influenza and should not be used as a sole basis for treatment.  This  assay is FDA approved for nasopharyngeal swab specimens only. Nasal  washings and aspirates are unacceptable for Xpert Xpress Flu testing. Performed at [facility], [place] [address], [place], [place] [address]   Lactic acid, plasma     Status: None   Collection Time: 10/29/18  7:27 PM  Result Value Ref Range   Lactic Acid, Venous 1.0 0.5 - 1.9 mmol/L    Comment: Performed at [facility], [place] [address] [place], [place] [address]   Dg Chest Port 1 View  Result Date: 10/29/2018 CLINICAL DATA:  Shortness of breath since Friday, nausea, history of influenza EXAM: PORTABLE CHEST 1 VIEW COMPARISON:  CT chest 07/20/2018 FINDINGS: Mild bilateral interstitial thickening. No pleural effusion or pneumothorax. Prior left upper lobectomy with postsurgical changes. No focal consolidation. Stable cardiomegaly. No acute osseous  abnormality. IMPRESSION: No acute cardiopulmonary disease. Electronically Signed   By: Kathreen Devoid   On: 10/29/2018 18:19    Pending Labs Unresulted Labs (From admission, onward)    Start     Ordered   10/29/18 1655  Urine culture  ONCE - STAT,   STAT     10/29/18 1655   10/29/18 1633  Culture, blood (Routine x 2)  BLOOD CULTURE X 2,   STAT     10/29/18 1632          Vitals/Pain Today's Vitals   10/29/18 1945 10/29/18 2000 10/29/18 2015 10/29/18 2030  BP: 140/70 132/69 135/74   Pulse: 92 91 90 87  Resp: (!) 21 (!) 21 (!) 22 (!) 24  Temp:      TempSrc:      SpO2: 96% 96% 94% 96%  Weight:      Height:      PainSc:        Isolation Precautions Droplet precaution  Medications Medications  sodium chloride flush (NS) 0.9 % injection 3 mL (3 mLs Intravenous Given 10/29/18 1932)  cefTRIAXone (ROCEPHIN) 1 g in sodium chloride 0.9 % 100 mL IVPB (0 g Intravenous Stopped 10/29/18 1849)  azithromycin (ZITHROMAX) 500 mg in sodium chloride 0.9 % 250 mL IVPB (0 mg Intravenous Stopped 10/29/18 1849)  oseltamivir (TAMIFLU) capsule 75 mg (75 mg Oral Given  10/29/18 1746)  lactated ringers bolus 1,000 mL (0 mLs Intravenous Stopped 10/29/18 1849)  acetaminophen (TYLENOL) tablet 650 mg (650 mg Oral Given 10/29/18 1746)  albuterol (PROVENTIL HFA;VENTOLIN HFA) 108 (90 Base) MCG/ACT inhaler 6 puff (6 puffs Inhalation Given 10/29/18 1741)  predniSONE (DELTASONE) tablet 60 mg (60 mg Oral Given 10/29/18 1746)  ipratropium-albuterol (DUONEB) 0.5-2.5 (3) MG/3ML nebulizer solution 3 mL (3 mLs Nebulization Given 10/29/18 1909)    Mobility walks with device Low fall risk   Focused Assessments Pulmonary Assessment Handoff:  Lung sounds: L Breath Sounds: Expiratory wheezes R Breath Sounds: Expiratory wheezes O2 Device: Nasal Cannula O2 Flow Rate (L/min): 4 L/min      R Recommendations: See Admitting Provider Note  Report given to:   Additional Notes: Flu A

## 2018-10-29 NOTE — ED Notes (Signed)
Rocephin switched to Left AC IV site due to compatibility caution.

## 2018-10-29 NOTE — H&P (Signed)
History and Physical    [name] UMP:[id number] DOB: Mar 21, 1941 DOA: 10/29/2018  PCP: [name], MD  Patient coming from: Home.  Chief Complaint: Shortness of breath.  HPI: [name] is a 78 y.o. male with with history of lung cancer status post lobectomy, COPD previous history of tobacco abuse hyperlipidemia has been experiencing shortness of breath for the last 3 days.  Has been having wheezing and productive cough subjective feeling of fever chills.  Denies any chest pain.  Had called up his PCP and had followed up with PCPs clinic today and was found to be wheezing and hypoxic and chest x-ray was showing possible infiltrates patient was referred to the ER.  Patient did cough up some blackish sputum also.  ED Course: In the ER patient was hypoxic and wheezing.  Chest x-ray does not show any infiltrates patient was febrile with temperature of around 100.4 F.  Lab work showed colitis of 18.5.  Blood cultures were sent lactate was normal.  Initially started on empiric antibiotics.  Influenza a turned out to be positive.  Patient started on Tamiflu admitted for acute bronchitis with acute respiratory failure with influenza.  Review of Systems: As per HPI, rest all negative.   Past Medical History:  Diagnosis Date  . Anxiety    pt. reports that he " could swing from the ......" because he is so anxious about the surgery   . Arthritis    R hand tendonitis   . COPD (chronic obstructive pulmonary disease) ([place])   . Depression   . Hyperlipidemia   . lung ca dx'd 10/2016  . Sleep apnea    cannot tolerate cpap    Past Surgical History:  Procedure Laterality Date  . COLONOSCOPY W/ POLYPECTOMY    . cyst back  2011  . CYSTOSCOPY  2012   for bleeding in bladder x 3  . MOUTH SURGERY    . VIDEO ASSISTED THORACOSCOPY (VATS)/ LOBECTOMY Left 12/12/2016   Procedure: VIDEO ASSISTED THORACOSCOPY (VATS)/LEFT UPPER LOBECTOMY;  Surgeon: [name], MD;  Location: [facility];   Service: Thoracic;  Laterality: Left;     reports that he quit smoking about 22 months ago. His smoking use included cigarettes. He smoked 0.50 packs per day. He has never used smokeless tobacco. He reports that he does not drink alcohol or use drugs.  Allergies  Allergen Reactions  . Nsaids Other (See Comments)    MD RESTRICTS USE OF ANY "BLOOD THINING" MEDS [ "DUE TO GROWTH IN BLADDER" ]   . Gadolinium Derivatives Nausea And Vomiting and Other (See Comments)    CHILLS SHAKING FEELING COLD  . Penicillins Other (See Comments)    "Feels like my skin is crawling"  Has patient had a PCN reaction causing immediate rash, facial/tongue/throat swelling, SOB or lightheadedness with hypotension: No Has patient had a PCN reaction causing severe rash involving mucus membranes or skin necrosis: No Has patient had a PCN reaction that required hospitalization No Has patient had a PCN reaction occurring within the last 10 years: No If all of the above answers are "NO", then may proceed with Cephalosporin use.     Family History  Problem Relation Age of Onset  . Colon cancer Brother   . Emphysema Other   . Heart disease Other   . Cancer Other   . Heart disease Father   . Heart disease Maternal Uncle   . Heart disease Paternal Uncle     Prior to Admission  medications   Medication Sig Start Date End Date Taking? Authorizing Provider  acetaminophen (TYLENOL) 500 MG tablet Take 1,000 mg by mouth daily as needed for mild pain or fever.   Yes [provider]  BREO ELLIPTA 200-25 MCG/INH AEPB Inhale 1 Inhaler into the lungs daily. 09/30/16  Yes Avva, Ravisankar, MD  fexofenadine (ALLEGRA) 180 MG tablet Take 180 mg by mouth daily as needed for allergies or rhinitis.   Yes [provider]  montelukast (SINGULAIR) 10 MG tablet Take 10 mg by mouth at bedtime.   Yes [provider]  predniSONE (DELTASONE) 20 MG tablet Take 20 mg by mouth daily. taper 10/27/18  Yes [provider]  sertraline (ZOLOFT) 100 MG tablet Take 100 mg by mouth daily. 08/27/18  Yes [provider]  simvastatin (ZOCOR) 20 MG tablet Take 20 mg by mouth every evening.   Yes [provider]  traZODone (DESYREL) 50 MG tablet Take 50 mg by mouth at bedtime.  07/18/18  Yes [provider]    Physical Exam: Vitals:   10/29/18 2015 10/29/18 2030 10/29/18 2055 10/29/18 2056  BP: 135/74  (!) 142/76   Pulse: 90 87 87   Resp: (!) 22 (!) 24 16   Temp:   98.7 F (37.1 C)   TempSrc:   Oral   SpO2: 94% 96% 98%   Weight:    87.7 kg  Height:    5\' 6"  (1.676 m)      Constitutional: Moderately built and nourished. Vitals:   10/29/18 2015 10/29/18 2030 10/29/18 2055 10/29/18 2056  BP: 135/74  (!) 142/76   Pulse: 90 87 87   Resp: (!) 22 (!) 24 16   Temp:   98.7 F (37.1 C)   TempSrc:   Oral   SpO2: 94% 96% 98%   Weight:    87.7 kg  Height:    5\' 6"  (1.676 m)   Eyes: Anicteric no pallor. ENMT: No discharge from the ears eyes nose and mouth. Neck: No mass felt.  No neck rigidity but no JVD appreciated. Respiratory: Bilateral expiratory wheeze and no crepitations. Cardiovascular: S1-S2 heard. Abdomen: Soft nontender bowel sounds present. Musculoskeletal: No edema.  No joint effusion. Skin: No rash. Neurologic: Alert awake oriented to time place and person.  Moves all extremities. Psychiatric: Appears normal per normal affect.   Labs on Admission: I have personally reviewed following labs and imaging studies  CBC: Recent Labs  Lab 10/29/18 1640  WBC 18.5*  NEUTROABS 16.4*  HGB 14.8  HCT 45.9  MCV 98.1  PLT 242   Basic Metabolic Panel: Recent Labs  Lab 10/29/18 1640  NA 136  K 4.0  CL 99  CO2 27  GLUCOSE 129*  BUN 19  CREATININE 0.76  CALCIUM 8.8*   GFR: Estimated Creatinine Clearance: 77.7 mL/min (by C-G formula based on SCr of 0.76 mg/dL). Liver Function Tests: Recent Labs  Lab 10/29/18 1640  AST 20  ALT 19  ALKPHOS 60   BILITOT 0.7  PROT 7.3  ALBUMIN 3.8   No results for input(s): LIPASE, AMYLASE in the last 168 hours. No results for input(s): AMMONIA in the last 168 hours. Coagulation Profile: Recent Labs  Lab 10/29/18 1640  INR 1.1   Cardiac Enzymes: No results for input(s): CKTOTAL, CKMB, CKMBINDEX, TROPONINI in the last 168 hours. BNP (last 3 results) No results for input(s): PROBNP in the last 8760 hours. HbA1C: No results for input(s): HGBA1C in the last 72 hours. CBG: No  results for input(s): GLUCAP in the last 168 hours. Lipid Profile: No results for input(s): CHOL, HDL, LDLCALC, TRIG, CHOLHDL, LDLDIRECT in the last 72 hours. Thyroid Function Tests: No results for input(s): TSH, T4TOTAL, FREET4, T3FREE, THYROIDAB in the last 72 hours. Anemia Panel: No results for input(s): VITAMINB12, FOLATE, FERRITIN, TIBC, IRON, RETICCTPCT in the last 72 hours. Urine analysis:    Component Value Date/Time   COLORURINE YELLOW 10/29/2018 1641   APPEARANCEUR HAZY (A) 10/29/2018 1641   LABSPEC 1.025 10/29/2018 1641   PHURINE 5.0 10/29/2018 1641   GLUCOSEU NEGATIVE 10/29/2018 1641   HGBUR MODERATE (A) 10/29/2018 1641   BILIRUBINUR NEGATIVE 10/29/2018 1641   KETONESUR 5 (A) 10/29/2018 1641   PROTEINUR 30 (A) 10/29/2018 1641   NITRITE NEGATIVE 10/29/2018 1641   LEUKOCYTESUR LARGE (A) 10/29/2018 1641   Sepsis Labs: @LABRCNTIP (procalcitonin:4,lacticidven:4) )No results found for this or any previous visit (from the past 240 hour(s)).   Radiological Exams on Admission: Dg Chest Port 1 View  Result Date: 10/29/2018 CLINICAL DATA:  Shortness of breath since Friday, nausea, history of influenza EXAM: PORTABLE CHEST 1 VIEW COMPARISON:  CT chest 07/20/2018 FINDINGS: Mild bilateral interstitial thickening. No pleural effusion or pneumothorax. Prior left upper lobectomy with postsurgical changes. No focal consolidation. Stable cardiomegaly. No acute osseous abnormality. IMPRESSION: No acute cardiopulmonary  disease. Electronically Signed   By: Kathreen Devoid   On: 10/29/2018 18:19    EKG: Independently reviewed.  Normal sinus rhythm with RBBB.  Assessment/Plan Principal Problem:   Acute respiratory failure with hypoxia (HCC) Active Problems:   Sleep apnea   Acute bronchitis   Influenza A    1. Acute respiratory failure with hypoxia secondary to acute bronchitis.  Influenza for which patient is started on Tamiflu.  Check sputum cultures inpatient time productive cough follow blood cultures was initially started on empiric antibiotics for which we will continue for now and may discontinue if there is cultures negative.  Continue with steroids nebulizer for bronchitis. 2. Hyperlipidemia on statins. 3. Depression on Zoloft and trazodone. 4. History of lung cancer being followed by Dr. Julien Nordmann.   DVT prophylaxis: Lovenox. Code Status: Full code. Family Communication: Discussed with patient. Disposition Plan: Home. Consults called: None. Admission status: Observation.   Rise Patience MD [facility] Pager [phone].  If 7PM-7AM, please contact night-coverage [email or web address] Password [facility]  10/29/2018, 9:41 PM

## 2018-10-29 NOTE — ED Triage Notes (Signed)
Per EMS, Pt is coming from PCP. Pt is Flu A pos, and positive for pneumonia in right lower lobe. SOB since Friday, some nausea. Worsening symptoms since then.

## 2018-10-30 DIAGNOSIS — J101 Influenza due to other identified influenza virus with other respiratory manifestations: Secondary | ICD-10-CM | POA: Diagnosis not present

## 2018-10-30 DIAGNOSIS — A419 Sepsis, unspecified organism: Secondary | ICD-10-CM | POA: Diagnosis not present

## 2018-10-30 LAB — BASIC METABOLIC PANEL
Anion gap: 7 (ref 5–15)
BUN: 17 mg/dL (ref 8–23)
CALCIUM: 8.8 mg/dL — AB (ref 8.9–10.3)
CO2: 30 mmol/L (ref 22–32)
Chloride: 104 mmol/L (ref 98–111)
Creatinine, Ser: 0.66 mg/dL (ref 0.61–1.24)
GFR calc non Af Amer: >60 mL/min (ref >60)
Glucose, Bld: 113 mg/dL — ABNORMAL HIGH (ref 70–99)
Potassium: 3.8 mmol/L (ref 3.5–5.1)
Sodium: 141 mmol/L (ref 135–145)

## 2018-10-30 LAB — CBC
HCT: 41 % (ref 39.0–52.0)
Hemoglobin: 13.3 g/dL (ref 13.0–17.0)
MCH: 31.8 pg (ref 26.0–34.0)
MCHC: 32.4 g/dL (ref 30.0–36.0)
MCV: 98.1 fL (ref 80.0–100.0)
PLATELETS: 222 10*3/uL (ref 150–400)
RBC: 4.18 MIL/uL — ABNORMAL LOW (ref 4.22–5.81)
RDW: 14.2 % (ref 11.5–15.5)
WBC: 16.1 10*3/uL — ABNORMAL HIGH (ref 4.0–10.5)
nRBC: 0 % (ref 0.0–0.2)

## 2018-10-30 LAB — STREP PNEUMONIAE URINARY ANTIGEN: Strep Pneumo Urinary Antigen: NEGATIVE

## 2018-10-30 MED ORDER — ALBUTEROL SULFATE (2.5 MG/3ML) 0.083% IN NEBU
2.5000 mg | INHALATION_SOLUTION | Freq: Three times a day (TID) | RESPIRATORY_TRACT | Status: DC
  Administered 2018-10-30 (×2): 2.5 mg via RESPIRATORY_TRACT
  Filled 2018-10-30 (×2): qty 3

## 2018-10-30 MED ORDER — IPRATROPIUM-ALBUTEROL 0.5-2.5 (3) MG/3ML IN SOLN: 3.0000 mL | Freq: Four times a day (QID) | RESPIRATORY_TRACT | 1 refills | Status: DC

## 2018-10-30 MED ORDER — PHENOL 1.4 % MT LIQD
1.0000 | OROMUCOSAL | Status: DC | PRN
  Administered 2018-10-30: 1 via OROMUCOSAL
  Filled 2018-10-30: qty 177

## 2018-10-30 MED ORDER — ALBUTEROL SULFATE (2.5 MG/3ML) 0.083% IN NEBU: 2.5000 mg | INHALATION_SOLUTION | RESPIRATORY_TRACT | 3 refills | Status: AC | PRN

## 2018-10-30 MED ORDER — OSELTAMIVIR PHOSPHATE 75 MG PO CAPS: 75.0000 mg | ORAL_CAPSULE | Freq: Two times a day (BID) | ORAL | 0 refills | Status: DC

## 2018-10-30 MED ORDER — ACETAMINOPHEN 325 MG PO TABS: 650.0000 mg | ORAL_TABLET | Freq: Four times a day (QID) | ORAL | Status: AC | PRN

## 2018-10-30 MED ORDER — ZOLPIDEM TARTRATE 5 MG PO TABS
5.0000 mg | ORAL_TABLET | Freq: Once | ORAL | Status: AC
  Administered 2018-10-30: 5 mg via ORAL
  Filled 2018-10-30: qty 1

## 2018-10-30 NOTE — Care Management Obs Status (Signed)
[facility] NOTIFICATION   Patient Details  Name: [name] MRN: [id number] Date of Birth: November 10, 1940   Medicare Observation Status Notification Given:  Yes    Dessa Phi, RN 10/30/2018, 3:51 PM

## 2018-10-30 NOTE — Discharge Instructions (Signed)
Follow with Primary MD Avva, Ravisankar, MD in 7 days   Get CBC, CMP, 2 view Chest X ray checked  by Primary MD next visit.    Activity: As tolerated with Full fall precautions use walker/cane & assistance as needed   Disposition Home    Diet: Heart Healthy  , with feeding assistance and aspiration precautions.  For Heart failure patients - Check your Weight same time everyday, if you gain over 2 pounds, or you develop in leg swelling, experience more shortness of breath or chest pain, call your Primary MD immediately. Follow Cardiac Low Salt Diet and 1.5 lit/day fluid restriction.   On your next visit with your primary care physician please Get Medicines reviewed and adjusted.   Please request your Prim.MD to go over all Hospital Tests and Procedure/Radiological results at the follow up, please get all Hospital records sent to your Prim MD by signing hospital release before you go home.   If you experience worsening of your admission symptoms, develop shortness of breath, life threatening emergency, suicidal or homicidal thoughts you must seek medical attention immediately by calling 911 or calling your MD immediately  if symptoms less severe.  You Must read complete instructions/literature along with all the possible adverse reactions/side effects for all the Medicines you take and that have been prescribed to you. Take any new Medicines after you have completely understood and accpet all the possible adverse reactions/side effects.   Do not drive, operating heavy machinery, perform activities at heights, swimming or participation in water activities or provide baby sitting services if your were admitted for syncope or siezures until you have seen by Primary MD or a Neurologist and advised to do so again.  Do not drive when taking Pain medications.    Do not take more than prescribed Pain, Sleep and Anxiety Medications  Special Instructions: If you have smoked or chewed Tobacco  in  the last 2 yrs please stop smoking, stop any regular Alcohol  and or any Recreational drug use.  Wear Seat belts while driving.   Please note  You were cared for by a hospitalist during your hospital stay. If you have any questions about your discharge medications or the care you received while you were in the hospital after you are discharged, you can call the unit and asked to speak with the hospitalist on call if the hospitalist that took care of you is not available. Once you are discharged, your primary care physician will handle any further medical issues. Please note that NO REFILLS for any discharge medications will be authorized once you are discharged, as it is imperative that you return to your primary care physician (or establish a relationship with a primary care physician if you do not have one) for your aftercare needs so that they can reassess your need for medications and monitor your lab values.

## 2018-10-30 NOTE — Discharge Summary (Signed)
[name], is a 78 y.o. male  DOB 09/16/1940  MRN [id number].  Admission date:  10/29/2018  Admitting Physician  Rise Patience, MD  Discharge Date:  10/30/2018   Primary MD  [name], MD  Recommendations for primary care physician for things to follow:  -Please check CBC, BMP during next visit   Admission Diagnosis  Influenza A [J10.1] COPD exacerbation ([place]) [J44.1] At risk for sepsis [Z91.89] Sepsis, due to unspecified organism, unspecified whether acute organ dysfunction present [facility]) [A41.9]   Discharge Diagnosis  Influenza A [J10.1] COPD exacerbation ([place]) [J44.1] At risk for sepsis [Z91.89] Sepsis, due to unspecified organism, unspecified whether acute organ dysfunction present [facility]) [A41.9]    Principal Problem:   Acute respiratory failure with hypoxia ([place]) Active Problems:   Sleep apnea   Acute bronchitis   Influenza A      Past Medical History:  Diagnosis Date  . Anxiety    pt. reports that he " could swing from the ......" because he is so anxious about the surgery   . Arthritis    R hand tendonitis   . COPD (chronic obstructive pulmonary disease) ([place])   . Depression   . Hyperlipidemia   . lung ca dx'd 10/2016  . Sleep apnea    cannot tolerate cpap    Past Surgical History:  Procedure Laterality Date  . COLONOSCOPY W/ POLYPECTOMY    . cyst back  2011  . CYSTOSCOPY  2012   for bleeding in bladder x 3  . MOUTH SURGERY    . VIDEO ASSISTED THORACOSCOPY (VATS)/ LOBECTOMY Left 12/12/2016   Procedure: VIDEO ASSISTED THORACOSCOPY (VATS)/LEFT UPPER LOBECTOMY;  Surgeon: [name], MD;  Location: [facility];  Service: Thoracic;  Laterality: Left;       History of present illness and  Hospital Course:     Kindly see H&P for history of present illness and admission details, please review complete Labs, Consult reports and Test reports for all details in  brief  HPI  from the history and physical done on the day of admission  HPI: [name] is a 78 y.o. male with with history of lung cancer status post lobectomy, COPD previous history of tobacco abuse hyperlipidemia has been experiencing shortness of breath for the last 3 days.  Has been having wheezing and productive cough subjective feeling of fever chills.  Denies any chest pain.  Had called up his PCP and had followed up with PCPs clinic today and was found to be wheezing and hypoxic and chest x-ray was showing possible infiltrates patient was referred to the ER.  Patient did cough up some blackish sputum also.  ED Course: In the ER patient was hypoxic and wheezing.  Chest x-ray does not show any infiltrates patient was febrile with temperature of around 100.4 F.  Lab work showed colitis of 18.5.  Blood cultures were sent lactate was normal.  Initially started on empiric antibiotics.  Influenza a turned out to be positive.  Patient started on  Tamiflu admitted for acute bronchitis with acute respiratory failure with influenza.  Hospital Course   1. Acute respiratory failure with hypoxia secondary Influenza A, there is no evidence of pneumonia, had some leukocytosis, but this is most likely related to steroids which has been started by his PCP, patient was started on Tamiflu, patient at baseline requiring oxygen on exertion, and sleeping at night, he was ambulated on the hallway, saturating 97% on 3 L, but hypoxic on room air, he will discharged on Tamiflu, with recommendation to continue his steroid course as before by PCP, no indication for antibiotics on discharge, patient will be given prescription for as needed albuterol and DuoNeb scheduled . 2. Hyperlipidemia on statins. 3. Depression on Zoloft and trazodone. 4. History of lung cancer being followed by Dr. Julien Nordmann.    Discharge Condition:  stable   Follow UP  Follow-up Information    Avva, Ravisankar, MD Follow up in 1 week(s).    Specialty:  Internal Medicine Contact information: [address] [place]  [address] [phone]             Discharge Instructions  and  Discharge Medications   Discharge Instructions    Discharge instructions   Complete by:  As directed    Follow with Primary MD Avva, Ravisankar, MD in 7 days   Get CBC, CMP, 2 view Chest X ray checked  by Primary MD next visit.    Activity: As tolerated with Full fall precautions use walker/cane & assistance as needed   Disposition Home    Diet: Heart Healthy  , with feeding assistance and aspiration precautions.  For Heart failure patients - Check your Weight same time everyday, if you gain over 2 pounds, or you develop in leg swelling, experience more shortness of breath or chest pain, call your Primary MD immediately. Follow Cardiac Low Salt Diet and 1.5 lit/day fluid restriction.   On your next visit with your primary care physician please Get Medicines reviewed and adjusted.   Please request your Prim.MD to go over all Hospital Tests and Procedure/Radiological results at the follow up, please get all Hospital records sent to your Prim MD by signing hospital release before you go home.   If you experience worsening of your admission symptoms, develop shortness of breath, life threatening emergency, suicidal or homicidal thoughts you must seek medical attention immediately by calling 911 or calling your MD immediately  if symptoms less severe.  You Must read complete instructions/literature along with all the possible adverse reactions/side effects for all the Medicines you take and that have been prescribed to you. Take any new Medicines after you have completely understood and accpet all the possible adverse reactions/side effects.   Do not drive, operating heavy machinery, perform activities at heights, swimming or participation in water activities or provide baby sitting services if your were admitted for syncope or  siezures until you have seen by Primary MD or a Neurologist and advised to do so again.  Do not drive when taking Pain medications.    Do not take more than prescribed Pain, Sleep and Anxiety Medications  Special Instructions: If you have smoked or chewed Tobacco  in the last 2 yrs please stop smoking, stop any regular Alcohol  and or any Recreational drug use.  Wear Seat belts while driving.   Please note  You were cared for by a hospitalist during your hospital stay. If you have any questions about your discharge medications or the care you received while you were  in the hospital after you are discharged, you can call the unit and asked to speak with the hospitalist on call if the hospitalist that took care of you is not available. Once you are discharged, your primary care physician will handle any further medical issues. Please note that NO REFILLS for any discharge medications will be authorized once you are discharged, as it is imperative that you return to your primary care physician (or establish a relationship with a primary care physician if you do not have one) for your aftercare needs so that they can reassess your need for medications and monitor your lab values.   Increase activity slowly   Complete by:  As directed      Allergies as of 10/30/2018      Reactions   Nsaids Other (See Comments)   MD RESTRICTS USE OF ANY "BLOOD THINING" MEDS [ "DUE TO GROWTH IN BLADDER" ]    Gadolinium Derivatives Nausea And Vomiting, Other (See Comments)   CHILLS SHAKING FEELING COLD   Penicillins Other (See Comments)   "Feels like my skin is crawling" Has patient had a PCN reaction causing immediate rash, facial/tongue/throat swelling, SOB or lightheadedness with hypotension: No Has patient had a PCN reaction causing severe rash involving mucus membranes or skin necrosis: No Has patient had a PCN reaction that required hospitalization No Has patient had a PCN reaction occurring within the  last 10 years: No If all of the above answers are "NO", then may proceed with Cephalosporin use.      Medication List    TAKE these medications   acetaminophen 325 MG tablet Commonly known as:  TYLENOL Take 2 tablets (650 mg total) by mouth every 6 (six) hours as needed for mild pain (or Fever >/= 101). What changed:    medication strength  how much to take  when to take this  reasons to take this   albuterol (2.5 MG/3ML) 0.083% nebulizer solution Commonly known as:  PROVENTIL Take 3 mLs (2.5 mg total) by nebulization every 2 (two) hours as needed for wheezing.   Breo Ellipta 200-25 MCG/INH Aepb Generic drug:  fluticasone furoate-vilanterol Inhale 1 Inhaler into the lungs daily.   fexofenadine 180 MG tablet Commonly known as:  ALLEGRA Take 180 mg by mouth daily as needed for allergies or rhinitis.   ipratropium-albuterol 0.5-2.5 (3) MG/3ML Soln Commonly known as:  DUONEB Take 3 mLs by nebulization 4 (four) times daily for 5 days.   montelukast 10 MG tablet Commonly known as:  SINGULAIR Take 10 mg by mouth at bedtime.   oseltamivir 75 MG capsule Commonly known as:  TAMIFLU Take 1 capsule (75 mg total) by mouth 2 (two) times daily.   predniSONE 20 MG tablet Commonly known as:  DELTASONE Take 20 mg by mouth daily. taper   sertraline 100 MG tablet Commonly known as:  ZOLOFT Take 100 mg by mouth daily.   simvastatin 20 MG tablet Commonly known as:  ZOCOR Take 20 mg by mouth every evening.   traZODone 50 MG tablet Commonly known as:  DESYREL Take 50 mg by mouth at bedtime.         Diet and Activity recommendation: See Discharge Instructions above   Consults obtained -  none   Major procedures and Radiology Reports - PLEASE review detailed and final reports for all details, in brief -      Dg Chest Port 1 View  Result Date: 10/29/2018 CLINICAL DATA:  Shortness of breath since Friday, nausea, history  of influenza EXAM: PORTABLE CHEST 1 VIEW  COMPARISON:  CT chest 07/20/2018 FINDINGS: Mild bilateral interstitial thickening. No pleural effusion or pneumothorax. Prior left upper lobectomy with postsurgical changes. No focal consolidation. Stable cardiomegaly. No acute osseous abnormality. IMPRESSION: No acute cardiopulmonary disease. Electronically Signed   By: Kathreen Devoid   On: 10/29/2018 18:19    Micro Results     Recent Results (from the past 240 hour(s))  Culture, blood (Routine x 2)     Status: None (Preliminary result)   Collection Time: 10/29/18  4:40 PM  Result Value Ref Range Status   Specimen Description   Final    BLOOD RIGHT ANTECUBITAL Performed at [facility] [address] [place], [place] [address]    Special Requests   Final    BOTTLES DRAWN AEROBIC AND ANAEROBIC Blood Culture results may not be optimal due to an inadequate volume of blood received in culture bottles Performed at [facility] [address] [place], [place] [address]    Culture   Final    NO GROWTH < 24 HOURS Performed at [facility] [address] [place], [place] [address]    Report Status PENDING  Incomplete  Culture, blood (Routine x 2)     Status: None (Preliminary result)   Collection Time: 10/29/18  4:41 PM  Result Value Ref Range Status   Specimen Description   Final    BLOOD RIGHT ARM Performed at [facility] [address], [place], [place] [address]    Special Requests   Final    BOTTLES DRAWN AEROBIC AND ANAEROBIC Blood Culture adequate volume Performed at [facility] [address] [place], [place] [address]    Culture   Final    NO GROWTH < 24 HOURS Performed at [facility] [name], [place], [place] [address]    Report Status PENDING  Incomplete       Today   Subjective:   Deepak Bless today has no headache,no chest or abdominal pain, does report generalized weakness, dry cough, ports some dyspnea, but  around his baseline .  Objective:   Blood pressure 122/66, pulse 78, temperature 98.6 F (37 C), temperature source Oral, resp. rate 18, height 5\' 6"  (1.676 m), weight 87.7 kg, SpO2 98 %.   Intake/Output Summary (Last 24 hours) at 10/30/2018 1520 Last data filed at 10/30/2018 1300 Gross per 24 hour  Intake 3075 ml  Output 550 ml  Net 2525 ml    Exam Awake Alert, Oriented x 3, No new F.N deficits, Normal affect Symmetrical Chest wall movement, Good air movement bilaterally, scattered wheezing RRR,No Gallops,Rubs or new Murmurs, No Parasternal Heave +ve B.Sounds, Abd Soft, Non tender, No organomegaly appriciated, No rebound -guarding or rigidity. No Cyanosis, Clubbing or edema, No new Rash or bruise  Data Review   CBC w Diff:  Lab Results  Component Value Date   WBC 16.1 (H) 10/30/2018   HGB 13.3 10/30/2018   HGB 14.8 07/20/2018   HGB 14.9 07/17/2017   HCT 41.0 10/30/2018   HCT 43.6 07/17/2017   PLT 222 10/30/2018   PLT 266 07/20/2018   PLT 282 07/17/2017   LYMPHOPCT 2 10/29/2018   LYMPHOPCT 10.5 (L) 07/17/2017   MONOPCT 8 10/29/2018   MONOPCT 15.4 (H) 07/17/2017   EOSPCT 0 10/29/2018   EOSPCT 3.7 07/17/2017   BASOPCT 0 10/29/2018   BASOPCT 0.5 07/17/2017    CMP:  Lab Results  Component Value Date   NA 141 10/30/2018   NA 139 07/17/2017   K 3.8 10/30/2018   K 4.4 07/17/2017   CL 104 10/30/2018   CO2 30 10/30/2018   CO2 29 07/17/2017   BUN 17 10/30/2018   BUN 14.8 07/17/2017   CREATININE 0.66 10/30/2018   CREATININE 0.81 07/20/2018   CREATININE 0.8 07/17/2017   PROT 7.3 10/29/2018   PROT 6.8 07/17/2017   ALBUMIN 3.8 10/29/2018   ALBUMIN 3.5 07/17/2017   BILITOT 0.7 10/29/2018   BILITOT 0.6 07/20/2018   BILITOT 0.55 07/17/2017   ALKPHOS 60 10/29/2018   ALKPHOS 81 07/17/2017   AST 20 10/29/2018   AST 16 07/20/2018   AST 16 07/17/2017   ALT 19 10/29/2018   ALT 15 07/20/2018   ALT 22 07/17/2017  .   Total Time in preparing paper work, data  evaluation and todays exam - 49 minutes  Phillips Climes M.D on 10/30/2018 at 3:20 PM  [facility]   Office  [phone]

## 2018-10-30 NOTE — Progress Notes (Signed)
   10/30/18 1309  Oxygen Therapy  SpO2 97 %  O2 Device Nasal Cannula  O2 Flow Rate (L/min) 3 L/min  pt up ambulating

## 2018-10-30 NOTE — TOC Transition Note (Signed)
Transition of Care [facility]) - CM/SW Discharge Note   Patient Details  Name: [name] MRN: [id number] Date of Birth: 14-Mar-1941  Transition of Care [facility]) CM/SW Contact:  Dessa Phi, RN Phone Number: 10/30/2018, 3:52 PM   Clinical Narrative:   Patient has home 02 with [facility].Now rewuires home 02 more often. Ordered for home 023l [place] continously. Will fax order to [facility].Patient has trvel tsnk for home. No futhrr CM needs.    Final next level of care: Home/Self Care Barriers to Discharge: No Barriers Identified   Patient Goals and CMS Choice Patient states their goals for this hospitalization and ongoing recovery are:: go home      Discharge Placement                       Discharge Plan and Services                        Social Determinants of Health (SDOH) Interventions     Readmission Risk Interventions No flowsheet data found.

## 2018-10-30 NOTE — Progress Notes (Signed)
Initial Nutrition Assessment  DOCUMENTATION CODES:   Obesity unspecified  INTERVENTION:  - Continue to encourage PO intakes.    NUTRITION DIAGNOSIS:   Increased nutrient needs related to acute illness as evidenced by estimated needs.  GOAL:   Patient will meet greater than or equal to 90% of their needs  MONITOR:   PO intake, Weight trends, Labs, I & O's  REASON FOR ASSESSMENT:   Malnutrition Screening Tool  ASSESSMENT:   78 y.o. male with history of lung cancer s/p lobectomy, COPD, previous history of tobacco abuse, and hyperlipidemia. He had been experiencing SOB for 3 days PTA. This was associated with wheezing and productive cough, fever, and chills. He went to PCP's clinic and CXR showed possible infiltrates and patient was referred to the ED. He was later found to be influenza A positive.78 y.o. male with history of lung cancer s/p lobectomy, COPD, previous history of tobacco abuse, and hyperlipidemia. He had been experiencing SOB for 3 days PTA. This was associated with wheezing and productive cough, fever, and chills. He went to PCP's clinic and CXR showed possible infiltrates and patient was referred to the ED. He was later found to be influenza A positive.  Patient consumed 100% of breakfast this AM (510 kcal, 17 grams of protein) without issue. Patient reports decreased appetite and intakes for the past 3-4 days d/t SOB and other flu-related symptoms. Appetite is now beginning to return.   Patient feels that he has lost 7-8 lb over the past 3-4 days. Per chart review, current weight is 193 lb and weight on 08/24/18 was 194 lb and weight on 07/25/18 was 198 lb. This indicates 5 lb weight loss (2.5% body weight) in the past 3 months; not significant for time frame.    Medications reviewed; 40 mg solu-medrol BID, 75 mg tamiflu BID, 60 mg deltasone x1 dose 3/16. Labs reviewed; Ca: 8.8 mg/dl.      NUTRITION - FOCUSED PHYSICAL EXAM:  Completed; no muscle and no fat  wasting.   Diet Order:   Diet Order            Diet Heart Room service appropriate? Yes; Fluid consistency: Thin  Diet effective now              EDUCATION NEEDS:   No education needs have been identified at this time  Skin:  Skin Assessment: Reviewed RN Assessment  Last BM:  3/17  Height:   Ht Readings from Last 1 Encounters:  10/29/18 5\' 6"  (1.676 m)    Weight:   Wt Readings from Last 1 Encounters:  10/29/18 87.7 kg    Ideal Body Weight:  64.54 kg  BMI:  Body mass index is 31.2 kg/m.  Estimated Nutritional Needs:   Kcal:  1600-1800 kcal  Protein:  60-75 grams  Fluid:  >/= 1.8 L/day     Jarome Matin, MS, RD, LDN, [facility] Inpatient Clinical Dietitian Pager # [phone] After hours/weekend pager # [phone]

## 2018-10-30 NOTE — Progress Notes (Signed)
   10/30/18 1258  Oxygen Therapy  SpO2 97 %  O2 Device Room Air  at rest

## 2018-10-30 NOTE — Progress Notes (Signed)
   10/30/18 1305  Oxygen Therapy  SpO2 (!) 79 %  O2 Device Room Air  up ambulating

## 2018-10-31 LAB — URINE CULTURE

## 2018-11-01 LAB — LEGIONELLA PNEUMOPHILA SEROGP 1 UR AG: L. pneumophila Serogp 1 Ur Ag: NEGATIVE

## 2018-11-03 LAB — CULTURE, BLOOD (ROUTINE X 2)
Culture: NO GROWTH
Culture: NO GROWTH
Special Requests: ADEQUATE

## 2018-11-05 ENCOUNTER — Institutional Professional Consult (permissible substitution): Payer: Medicare Other | Admitting: Pulmonary Disease

## 2018-11-05 ENCOUNTER — Institutional Professional Consult (permissible substitution): Payer: Medicare Other | Admitting: Internal Medicine

## 2018-11-05 DIAGNOSIS — G4733 Obstructive sleep apnea (adult) (pediatric): Secondary | ICD-10-CM | POA: Diagnosis not present

## 2018-11-05 DIAGNOSIS — C3490 Malignant neoplasm of unspecified part of unspecified bronchus or lung: Secondary | ICD-10-CM | POA: Diagnosis not present

## 2018-11-05 DIAGNOSIS — J09X2 Influenza due to identified novel influenza A virus with other respiratory manifestations: Secondary | ICD-10-CM | POA: Diagnosis not present

## 2018-11-05 DIAGNOSIS — J9601 Acute respiratory failure with hypoxia: Secondary | ICD-10-CM | POA: Diagnosis not present

## 2018-11-05 DIAGNOSIS — J441 Chronic obstructive pulmonary disease with (acute) exacerbation: Secondary | ICD-10-CM | POA: Diagnosis not present

## 2018-11-05 DIAGNOSIS — F329 Major depressive disorder, single episode, unspecified: Secondary | ICD-10-CM | POA: Diagnosis not present

## 2018-11-05 DIAGNOSIS — I10 Essential (primary) hypertension: Secondary | ICD-10-CM | POA: Diagnosis not present

## 2018-11-05 DIAGNOSIS — A419 Sepsis, unspecified organism: Secondary | ICD-10-CM | POA: Diagnosis not present

## 2018-11-23 ENCOUNTER — Telehealth: Payer: Self-pay | Admitting: Internal Medicine

## 2018-11-23 ENCOUNTER — Ambulatory Visit (HOSPITAL_COMMUNITY)
Admission: RE | Admit: 2018-11-23 | Discharge: 2018-11-23 | Disposition: A | Discharge status: Home / Self Care | Payer: Medicare Other | Source: Ambulatory Visit | Attending: Internal Medicine | Admitting: Internal Medicine

## 2018-11-23 ENCOUNTER — Other Ambulatory Visit: Payer: Self-pay

## 2018-11-23 ENCOUNTER — Encounter (HOSPITAL_COMMUNITY): Payer: Self-pay

## 2018-11-23 ENCOUNTER — Inpatient Hospital Stay: Payer: Medicare Other | Attending: Internal Medicine

## 2018-11-23 DIAGNOSIS — F419 Anxiety disorder, unspecified: Secondary | ICD-10-CM | POA: Insufficient documentation

## 2018-11-23 DIAGNOSIS — F329 Major depressive disorder, single episode, unspecified: Secondary | ICD-10-CM | POA: Insufficient documentation

## 2018-11-23 DIAGNOSIS — E785 Hyperlipidemia, unspecified: Secondary | ICD-10-CM | POA: Insufficient documentation

## 2018-11-23 DIAGNOSIS — J449 Chronic obstructive pulmonary disease, unspecified: Secondary | ICD-10-CM | POA: Insufficient documentation

## 2018-11-23 DIAGNOSIS — Z886 Allergy status to analgesic agent status: Secondary | ICD-10-CM | POA: Insufficient documentation

## 2018-11-23 DIAGNOSIS — C3490 Malignant neoplasm of unspecified part of unspecified bronchus or lung: Secondary | ICD-10-CM | POA: Insufficient documentation

## 2018-11-23 DIAGNOSIS — Z880 Allergy status to penicillin: Secondary | ICD-10-CM | POA: Insufficient documentation

## 2018-11-23 DIAGNOSIS — Z79899 Other long term (current) drug therapy: Secondary | ICD-10-CM | POA: Insufficient documentation

## 2018-11-23 DIAGNOSIS — R911 Solitary pulmonary nodule: Secondary | ICD-10-CM | POA: Diagnosis not present

## 2018-11-23 DIAGNOSIS — E041 Nontoxic single thyroid nodule: Secondary | ICD-10-CM | POA: Insufficient documentation

## 2018-11-23 DIAGNOSIS — G4730 Sleep apnea, unspecified: Secondary | ICD-10-CM | POA: Diagnosis not present

## 2018-11-23 DIAGNOSIS — Z902 Acquired absence of lung [part of]: Secondary | ICD-10-CM | POA: Diagnosis not present

## 2018-11-23 LAB — CMP (CANCER CENTER ONLY)
ALT: 17 U/L (ref 0–44)
AST: 15 U/L (ref 15–41)
Albumin: 3.6 g/dL (ref 3.5–5.0)
Alkaline Phosphatase: 70 U/L (ref 38–126)
Anion gap: 8 (ref 5–15)
BUN: 13 mg/dL (ref 8–23)
CO2: 28 mmol/L (ref 22–32)
Calcium: 9.2 mg/dL (ref 8.9–10.3)
Chloride: 103 mmol/L (ref 98–111)
Creatinine: 0.75 mg/dL (ref 0.61–1.24)
GFR, Est AFR Am: >60 mL/min (ref >60)
GFR, Estimated: >60 mL/min (ref >60)
Glucose, Bld: 97 mg/dL (ref 70–99)
Potassium: 4.1 mmol/L (ref 3.5–5.1)
Sodium: 139 mmol/L (ref 135–145)
Total Bilirubin: 0.5 mg/dL (ref 0.3–1.2)
Total Protein: 7 g/dL (ref 6.5–8.1)

## 2018-11-23 LAB — CBC WITH DIFFERENTIAL (CANCER CENTER ONLY)
Abs Immature Granulocytes: 0.13 10*3/uL — ABNORMAL HIGH (ref 0.00–0.07)
Basophils Absolute: 0.1 10*3/uL (ref 0.0–0.1)
Basophils Relative: 1 %
Eosinophils Absolute: 0.3 10*3/uL (ref 0.0–0.5)
Eosinophils Relative: 3 %
HCT: 44.6 % (ref 39.0–52.0)
Hemoglobin: 15.1 g/dL (ref 13.0–17.0)
Immature Granulocytes: 2 %
Lymphocytes Relative: 13 %
Lymphs Abs: 1.1 10*3/uL (ref 0.7–4.0)
MCH: 31.5 pg (ref 26.0–34.0)
MCHC: 33.9 g/dL (ref 30.0–36.0)
MCV: 92.9 fL (ref 80.0–100.0)
Monocytes Absolute: 1 10*3/uL (ref 0.1–1.0)
Monocytes Relative: 12 %
Neutro Abs: 5.9 10*3/uL (ref 1.7–7.7)
Neutrophils Relative %: 69 %
Platelet Count: 269 10*3/uL (ref 150–400)
RBC: 4.8 MIL/uL (ref 4.22–5.81)
RDW: 13.7 % (ref 11.5–15.5)
WBC Count: 8.4 10*3/uL (ref 4.0–10.5)
nRBC: 0 % (ref 0.0–0.2)

## 2018-11-23 MED ORDER — IOHEXOL 300 MG/ML  SOLN
75.0000 mL | Freq: Once | INTRAMUSCULAR | Status: AC | PRN
  Administered 2018-11-23: 14:00:00 75 mL via INTRAVENOUS

## 2018-11-23 MED ORDER — SODIUM CHLORIDE (PF) 0.9 % IJ SOLN
INTRAMUSCULAR | Status: AC
  Filled 2018-11-23: qty 50

## 2018-11-23 NOTE — Telephone Encounter (Signed)
Called patient regarding upcoming appointment, patient does not have access for Webex and would like a telephone visit instead for 04/14.

## 2018-11-27 ENCOUNTER — Encounter: Payer: Self-pay | Admitting: Internal Medicine

## 2018-11-27 ENCOUNTER — Inpatient Hospital Stay (HOSPITAL_BASED_OUTPATIENT_CLINIC_OR_DEPARTMENT_OTHER): Payer: Medicare Other | Admitting: Internal Medicine

## 2018-11-27 DIAGNOSIS — C3490 Malignant neoplasm of unspecified part of unspecified bronchus or lung: Secondary | ICD-10-CM

## 2018-11-27 DIAGNOSIS — C3492 Malignant neoplasm of unspecified part of left bronchus or lung: Secondary | ICD-10-CM

## 2018-11-27 NOTE — Progress Notes (Signed)
Virtual Visit via Telephone Note  I connected with [name] on 11/27/18 at 11:15 AM EDT by telephone and verified that I am speaking with the correct person using two identifiers.   I discussed the limitations, risks, security and privacy concerns of performing an evaluation and management service by telephone and the availability of in person appointments. I also discussed with the patient that there may be a patient responsible charge related to this service. The patient expressed understanding and agreed to proceed.       [facility] Telephone:([phone]) [phone]   Fax:(336) [phone]  OFFICE PROGRESS NOTE  [name], MD [place] [address]  DIAGNOSIS: Stage IB (T2a, N0, M0) non-small cell lung cancer, poorly differentiated invasive squamous cell carcinoma diagnosed in April 2018  PRIOR THERAPY: Status post left upper lobectomy with lymph node dissection under the care of Dr. Roxan Hockey on 12/12/2016 with tumor size of 3.2 cm.  CURRENT THERAPY: Observation.  History of Present Illness: [name] 78 y.o. male has a telephone virtual visit with me today for evaluation and discussion of his scan results.  The patient is feeling fine today with no concerning complaints.  He denied having any chest pain but has shortness of breath with exertion with no cough or hemoptysis.  He denied having any fever or chills.  He has no nausea, vomiting, diarrhea or constipation.  He denied having any headache or visual changes.  He had repeat CT scan of the chest performed recently at the visit.  For discussion of his scan results and treatment options.  MEDICAL HISTORY: Past Medical History:  Diagnosis Date   Anxiety    pt. reports that he " could swing from the ......" because he is so anxious about the surgery    Arthritis    R hand tendonitis    COPD (chronic obstructive pulmonary disease) ([place])    Depression    Hyperlipidemia    lung ca dx'd  10/2016   Sleep apnea    cannot tolerate cpap    ALLERGIES:  is allergic to nsaids; gadolinium derivatives; and penicillins.  MEDICATIONS:  Current Outpatient Medications  Medication Sig Dispense Refill   acetaminophen (TYLENOL) 325 MG tablet Take 2 tablets (650 mg total) by mouth every 6 (six) hours as needed for mild pain (or Fever >/= 101).     albuterol (PROVENTIL) (2.5 MG/3ML) 0.083% nebulizer solution Take 3 mLs (2.5 mg total) by nebulization every 2 (two) hours as needed for wheezing. 75 mL 3   BREO ELLIPTA 200-25 MCG/INH AEPB Inhale 1 Inhaler into the lungs daily.  2   fexofenadine (ALLEGRA) 180 MG tablet Take 180 mg by mouth daily as needed for allergies or rhinitis.     ipratropium-albuterol (DUONEB) 0.5-2.5 (3) MG/3ML SOLN Take 3 mLs by nebulization 4 (four) times daily for 5 days. 360 mL 1   montelukast (SINGULAIR) 10 MG tablet Take 10 mg by mouth at bedtime.     oseltamivir (TAMIFLU) 75 MG capsule Take 1 capsule (75 mg total) by mouth 2 (two) times daily. 9 capsule 0   predniSONE (DELTASONE) 20 MG tablet Take 20 mg by mouth daily. taper     sertraline (ZOLOFT) 100 MG tablet Take 100 mg by mouth daily.     simvastatin (ZOCOR) 20 MG tablet Take 20 mg by mouth every evening.     traZODone (DESYREL) 50 MG tablet Take 50 mg by mouth at bedtime.   1   No current  facility-administered medications for this visit.     SURGICAL HISTORY:  Past Surgical History:  Procedure Laterality Date   COLONOSCOPY W/ POLYPECTOMY     cyst back  2011   CYSTOSCOPY  2012   for bleeding in bladder x 3   MOUTH SURGERY     VIDEO ASSISTED THORACOSCOPY (VATS)/ LOBECTOMY Left 12/12/2016   Procedure: VIDEO ASSISTED THORACOSCOPY (VATS)/LEFT UPPER LOBECTOMY;  Surgeon: [name], MD;  Location: [facility];  Service: Thoracic;  Laterality: Left;    REVIEW OF SYSTEMS:  A comprehensive review of systems was negative except for: Respiratory: positive for dyspnea on exertion    LABORATORY DATA: Lab Results  Component Value Date   WBC 8.4 11/23/2018   HGB 15.1 11/23/2018   HCT 44.6 11/23/2018   MCV 92.9 11/23/2018   PLT 269 11/23/2018      Chemistry      Component Value Date/Time   NA 139 11/23/2018 1156   NA 139 07/17/2017 1049   K 4.1 11/23/2018 1156   K 4.4 07/17/2017 1049   CL 103 11/23/2018 1156   CO2 28 11/23/2018 1156   CO2 29 07/17/2017 1049   BUN 13 11/23/2018 1156   BUN 14.8 07/17/2017 1049   CREATININE 0.75 11/23/2018 1156   CREATININE 0.8 07/17/2017 1049      Component Value Date/Time   CALCIUM 9.2 11/23/2018 1156   CALCIUM 9.3 07/17/2017 1049   ALKPHOS 70 11/23/2018 1156   ALKPHOS 81 07/17/2017 1049   AST 15 11/23/2018 1156   AST 16 07/17/2017 1049   ALT 17 11/23/2018 1156   ALT 22 07/17/2017 1049   BILITOT 0.5 11/23/2018 1156   BILITOT 0.55 07/17/2017 1049       RADIOGRAPHIC STUDIES: Ct Chest W Contrast  Result Date: 11/25/2018 CLINICAL DATA:  History of lung cancer status post left upper lobe wedge resection 12/12/2016. EXAM: CT CHEST WITH CONTRAST TECHNIQUE: Multidetector CT imaging of the chest was performed during intravenous contrast administration. CONTRAST:  30mL OMNIPAQUE IOHEXOL 300 MG/ML  SOLN COMPARISON:  CT 07/20/2018 and 01/16/2018. FINDINGS: Cardiovascular: Mild atherosclerosis of the aorta, great vessels and coronary arteries. Stable mild central dilatation of the pulmonary arteries. No acute vascular findings. The heart size is normal. There is no pericardial effusion. Mediastinum/Nodes: There are no enlarged mediastinal, hilar or axillary lymph nodes.Scattered small mediastinal lymph nodes are stable. There is a stable right thyroid nodule measuring approximately 2.6 cm on image 29/3. The trachea and esophagus appear unremarkable. Lungs/Pleura: There is no pleural effusion or pneumothorax. There is mild centrilobular and paraseptal emphysema. There are stable postsurgical changes in the left upper lobe from  previous wedge resection. A 6 mm well-circumscribed nodule in the superior segment of the left lower lobe (image 47/6) is stable from the baseline examination. There is stable mild scarring at the left lung base. Posteriorly in the right upper lobe, there is a spiculated nodule abutting the fissure and measuring 9 x 7 mm on image 59/2 which has progressively enlarged over the last 2 studies. Scattered tiny nodules in the right lower lobe are stable (images 74, 99, 100, 112 and 116. No other enlarging pulmonary nodules. Upper abdomen: The visualized upper abdomen appears stable without acute findings. Hypervascular lesion in the right lobe (image 153/3) is stable. Musculoskeletal/Chest wall: No suspicious or acute osseous findings. Stable dermal and subcutaneous lesions in the upper and mid left back (images 7/3 and 189/8). IMPRESSION: 1. Further enlargement of spiculated nodule posteriorly in the right upper lobe,  most likely representing metachronous primary lung cancer. This is now borderline in size for PET-CT evaluation. 2. Additional small pulmonary nodules bilaterally are stable and considered benign. Stable postsurgical changes in the left upper lobe from previous wedge resection. 3. No mediastinal adenopathy. 4. Stable right thyroid nodule. Electronically Signed   By: Richardean Sale M.D.   On: 11/25/2018 19:05   Dg Chest Port 1 View  Result Date: 10/29/2018 CLINICAL DATA:  Shortness of breath since Friday, nausea, history of influenza EXAM: PORTABLE CHEST 1 VIEW COMPARISON:  CT chest 07/20/2018 FINDINGS: Mild bilateral interstitial thickening. No pleural effusion or pneumothorax. Prior left upper lobectomy with postsurgical changes. No focal consolidation. Stable cardiomegaly. No acute osseous abnormality. IMPRESSION: No acute cardiopulmonary disease. Electronically Signed   By: Kathreen Devoid   On: 10/29/2018 18:19    ASSESSMENT AND PLAN: This is a very pleasant 78 years old white male recently  diagnosed with a stage IB non-small cell lung cancer, squamous cell carcinoma presented with left upper lobe lung mass status post left upper lobectomy with lymph node dissection. The tumor size was 3.2 cm. There is no evidence for visceral pleural or lymphovascular invasion. The patient is currently on observation and he is feeling fine except for mild shortness of breath with exertion. He had repeat CT scan of the chest performed recently.  I personally and independently reviewed the scan images and discussed the results with the patient today.  His scan showed no concerning findings except for a slightly enlarging right upper lobe pulmonary nodules that could be suspicious for metachronous neoplasm. I recommended for the patient to continue on observation with repeat CT scan of the chest in 3 months for further evaluation of this problem.  I may consider him for a PET scan and biopsy at that time if needed. He was advised to call immediately if he has any concerning symptoms in the interval.  Follow Up Instructions: Lab and scan before next MD visit in 3 months.   I discussed the assessment and treatment plan with the patient. The patient was provided an opportunity to ask questions and all were answered. The patient agreed with the plan and demonstrated an understanding of the instructions.   The patient was advised to call back or seek an in-person evaluation if the symptoms worsen or if the condition fails to improve as anticipated.  I provided 12 minutes of non-face-to-face time during this encounter.   Eilleen Kempf, MD

## 2018-11-30 DIAGNOSIS — R3129 Other microscopic hematuria: Secondary | ICD-10-CM | POA: Diagnosis not present

## 2018-11-30 DIAGNOSIS — N390 Urinary tract infection, site not specified: Secondary | ICD-10-CM | POA: Diagnosis not present

## 2018-12-06 DIAGNOSIS — K8020 Calculus of gallbladder without cholecystitis without obstruction: Secondary | ICD-10-CM | POA: Diagnosis not present

## 2018-12-06 DIAGNOSIS — R310 Gross hematuria: Secondary | ICD-10-CM | POA: Diagnosis not present

## 2018-12-06 DIAGNOSIS — N200 Calculus of kidney: Secondary | ICD-10-CM | POA: Diagnosis not present

## 2018-12-25 DIAGNOSIS — R310 Gross hematuria: Secondary | ICD-10-CM | POA: Diagnosis not present

## 2018-12-25 DIAGNOSIS — N420 Calculus of prostate: Secondary | ICD-10-CM | POA: Diagnosis not present

## 2019-02-22 ENCOUNTER — Ambulatory Visit (HOSPITAL_COMMUNITY)
Admission: RE | Admit: 2019-02-22 | Discharge: 2019-02-22 | Disposition: A | Discharge status: Home / Self Care | Payer: Medicare Other | Source: Ambulatory Visit | Attending: Internal Medicine | Admitting: Internal Medicine

## 2019-02-22 ENCOUNTER — Encounter (HOSPITAL_COMMUNITY): Payer: Self-pay

## 2019-02-22 ENCOUNTER — Other Ambulatory Visit: Payer: Self-pay

## 2019-02-22 ENCOUNTER — Inpatient Hospital Stay: Payer: Medicare Other | Attending: Internal Medicine

## 2019-02-22 DIAGNOSIS — C3412 Malignant neoplasm of upper lobe, left bronchus or lung: Secondary | ICD-10-CM | POA: Insufficient documentation

## 2019-02-22 DIAGNOSIS — Z902 Acquired absence of lung [part of]: Secondary | ICD-10-CM | POA: Insufficient documentation

## 2019-02-22 DIAGNOSIS — C3490 Malignant neoplasm of unspecified part of unspecified bronchus or lung: Secondary | ICD-10-CM | POA: Insufficient documentation

## 2019-02-22 DIAGNOSIS — J439 Emphysema, unspecified: Secondary | ICD-10-CM | POA: Diagnosis not present

## 2019-02-22 LAB — CBC WITH DIFFERENTIAL (CANCER CENTER ONLY)
Abs Immature Granulocytes: 0.08 10*3/uL — ABNORMAL HIGH (ref 0.00–0.07)
Basophils Absolute: 0.1 10*3/uL (ref 0.0–0.1)
Basophils Relative: 1 %
Eosinophils Absolute: 0.3 10*3/uL (ref 0.0–0.5)
Eosinophils Relative: 3 %
HCT: 48.1 % (ref 39.0–52.0)
Hemoglobin: 16.1 g/dL (ref 13.0–17.0)
Immature Granulocytes: 1 %
Lymphocytes Relative: 11 %
Lymphs Abs: 1 10*3/uL (ref 0.7–4.0)
MCH: 31.6 pg (ref 26.0–34.0)
MCHC: 33.5 g/dL (ref 30.0–36.0)
MCV: 94.5 fL (ref 80.0–100.0)
Monocytes Absolute: 1.1 10*3/uL — ABNORMAL HIGH (ref 0.1–1.0)
Monocytes Relative: 12 %
Neutro Abs: 6.7 10*3/uL (ref 1.7–7.7)
Neutrophils Relative %: 72 %
Platelet Count: 277 10*3/uL (ref 150–400)
RBC: 5.09 MIL/uL (ref 4.22–5.81)
RDW: 13.4 % (ref 11.5–15.5)
WBC Count: 9.2 10*3/uL (ref 4.0–10.5)
nRBC: 0 % (ref 0.0–0.2)

## 2019-02-22 LAB — CMP (CANCER CENTER ONLY)
ALT: 15 U/L (ref 0–44)
AST: 13 U/L — ABNORMAL LOW (ref 15–41)
Albumin: 3.9 g/dL (ref 3.5–5.0)
Alkaline Phosphatase: 82 U/L (ref 38–126)
Anion gap: 9 (ref 5–15)
BUN: 14 mg/dL (ref 8–23)
CO2: 30 mmol/L (ref 22–32)
Calcium: 9.4 mg/dL (ref 8.9–10.3)
Chloride: 101 mmol/L (ref 98–111)
Creatinine: 0.8 mg/dL (ref 0.61–1.24)
GFR, Est AFR Am: >60 mL/min (ref >60)
GFR, Estimated: >60 mL/min (ref >60)
Glucose, Bld: 99 mg/dL (ref 70–99)
Potassium: 4.7 mmol/L (ref 3.5–5.1)
Sodium: 140 mmol/L (ref 135–145)
Total Bilirubin: 0.6 mg/dL (ref 0.3–1.2)
Total Protein: 7.4 g/dL (ref 6.5–8.1)

## 2019-02-22 MED ORDER — SODIUM CHLORIDE (PF) 0.9 % IJ SOLN
INTRAMUSCULAR | Status: AC
  Filled 2019-02-22: qty 50

## 2019-02-22 MED ORDER — IOHEXOL 300 MG/ML  SOLN
75.0000 mL | Freq: Once | INTRAMUSCULAR | Status: AC | PRN
  Administered 2019-02-22: 13:00:00 75 mL via INTRAVENOUS

## 2019-02-25 DIAGNOSIS — J449 Chronic obstructive pulmonary disease, unspecified: Secondary | ICD-10-CM | POA: Diagnosis not present

## 2019-02-25 DIAGNOSIS — G4733 Obstructive sleep apnea (adult) (pediatric): Secondary | ICD-10-CM | POA: Diagnosis not present

## 2019-02-25 DIAGNOSIS — C3490 Malignant neoplasm of unspecified part of unspecified bronchus or lung: Secondary | ICD-10-CM | POA: Diagnosis not present

## 2019-02-25 DIAGNOSIS — N200 Calculus of kidney: Secondary | ICD-10-CM | POA: Diagnosis not present

## 2019-02-25 DIAGNOSIS — E669 Obesity, unspecified: Secondary | ICD-10-CM | POA: Diagnosis not present

## 2019-02-25 DIAGNOSIS — J9601 Acute respiratory failure with hypoxia: Secondary | ICD-10-CM | POA: Diagnosis not present

## 2019-02-25 DIAGNOSIS — I10 Essential (primary) hypertension: Secondary | ICD-10-CM | POA: Diagnosis not present

## 2019-02-26 ENCOUNTER — Encounter: Payer: Self-pay | Admitting: Internal Medicine

## 2019-02-26 ENCOUNTER — Telehealth: Payer: Self-pay | Admitting: *Deleted

## 2019-02-26 ENCOUNTER — Other Ambulatory Visit: Payer: Self-pay

## 2019-02-26 ENCOUNTER — Inpatient Hospital Stay (HOSPITAL_BASED_OUTPATIENT_CLINIC_OR_DEPARTMENT_OTHER): Payer: Medicare Other | Admitting: Internal Medicine

## 2019-02-26 VITALS — BP 151/67 | HR 70 | Temp 98.2°F | Resp 20 | Ht 66.0 in | Wt 191.9 lb

## 2019-02-26 DIAGNOSIS — C3490 Malignant neoplasm of unspecified part of unspecified bronchus or lung: Secondary | ICD-10-CM

## 2019-02-26 DIAGNOSIS — J430 Unilateral pulmonary emphysema [MacLeod's syndrome]: Secondary | ICD-10-CM

## 2019-02-26 DIAGNOSIS — C3412 Malignant neoplasm of upper lobe, left bronchus or lung: Secondary | ICD-10-CM

## 2019-02-26 DIAGNOSIS — C3492 Malignant neoplasm of unspecified part of left bronchus or lung: Secondary | ICD-10-CM

## 2019-02-26 DIAGNOSIS — Z902 Acquired absence of lung [part of]: Secondary | ICD-10-CM | POA: Diagnosis not present

## 2019-02-26 NOTE — Telephone Encounter (Signed)
TCT wife and spoke with her. She had some questions regarding the scan results. Reviewed them with her, his scan is stable at this time  and reinforced the information that Dr. Julien Nordmann provided the patient earlier today.  He will continue to be on observation and and will receive a repeat scan in 3 months.  Mrs. Poyer satisfied with this and voiced understanding.

## 2019-02-26 NOTE — Telephone Encounter (Signed)
Received call from pt's wife. Her husband was seen today by Dr. Julien Nordmann. Pt received paper work at check out and his wife, [name], has questions about his diagnosis'. She is requesting a call back from Dr. Julien Nordmann for clarification about the visit today.

## 2019-02-26 NOTE — Telephone Encounter (Signed)
Please call her back and find out what is her question.  It is very hard for me to have a phone call for every visit I do during the day.  Thank you.

## 2019-02-26 NOTE — Progress Notes (Signed)
[facility] Telephone:([phone]) [phone]   Fax:(336) [phone]  OFFICE PROGRESS NOTE  David Solian, MD [place] [address]  DIAGNOSIS: Stage IB (T2a, N0, M0) non-small cell lung cancer, poorly differentiated invasive squamous cell carcinoma diagnosed in April 2018  PRIOR THERAPY: Status post left upper lobectomy with lymph node dissection under the care of Dr. Roxan Hockey on 12/12/2016 with tumor size of 3.2 cm.  CURRENT THERAPY: Observation.  INTERVAL HISTORY: [name] 78 y.o. male returns to the clinic today for 3 months follow-up visit.  The patient is feeling fine today with no concerning complaints except for shortness of breath with exertion.  He gained around 8 pounds since he quit smoking.  He denied having any chest pain but has mild cough with no hemoptysis.  He has no fever or chills.  He has no nausea, vomiting, diarrhea or constipation.  He has no headache or visual changes.  The patient had repeat CT scan of the chest performed recently and is here for evaluation and discussion of his scan results.  MEDICAL HISTORY: Past Medical History:  Diagnosis Date  . Anxiety    pt. reports that he " could swing from the ......" because he is so anxious about the surgery   . Arthritis    R hand tendonitis   . COPD (chronic obstructive pulmonary disease) ([place])   . Depression   . Hyperlipidemia   . lung ca dx'd 10/2016  . Sleep apnea    cannot tolerate cpap    ALLERGIES:  is allergic to nsaids; gadolinium derivatives; and penicillins.  MEDICATIONS:  Current Outpatient Medications  Medication Sig Dispense Refill  . acetaminophen (TYLENOL) 325 MG tablet Take 2 tablets (650 mg total) by mouth every 6 (six) hours as needed for mild pain (or Fever >/= 101).    Marland Kitchen albuterol (PROVENTIL) (2.5 MG/3ML) 0.083% nebulizer solution Take 3 mLs (2.5 mg total) by nebulization every 2 (two) hours as needed for wheezing. 75 mL 3  . budesonide-formoterol  (SYMBICORT) 160-4.5 MCG/ACT inhaler Inhale 2 puffs into the lungs 2 (two) times daily.    . fexofenadine (ALLEGRA) 180 MG tablet Take 180 mg by mouth daily as needed for allergies or rhinitis.    Marland Kitchen sertraline (ZOLOFT) 100 MG tablet Take 100 mg by mouth daily.    . simvastatin (ZOCOR) 20 MG tablet Take 20 mg by mouth every evening.    . traZODone (DESYREL) 50 MG tablet Take 50 mg by mouth at bedtime.   1  . zolpidem (AMBIEN) 5 MG tablet TK 1 T PO QHS    . ipratropium-albuterol (DUONEB) 0.5-2.5 (3) MG/3ML SOLN Take 3 mLs by nebulization 4 (four) times daily for 5 days. 360 mL 1   No current facility-administered medications for this visit.     SURGICAL HISTORY:  Past Surgical History:  Procedure Laterality Date  . COLONOSCOPY W/ POLYPECTOMY    . cyst back  2011  . CYSTOSCOPY  2012   for bleeding in bladder x 3  . MOUTH SURGERY    . VIDEO ASSISTED THORACOSCOPY (VATS)/ LOBECTOMY Left 12/12/2016   Procedure: VIDEO ASSISTED THORACOSCOPY (VATS)/LEFT UPPER LOBECTOMY;  Surgeon: [name], MD;  Location: [facility];  Service: Thoracic;  Laterality: Left;    REVIEW OF SYSTEMS:  A comprehensive review of systems was negative except for: Respiratory: positive for cough and dyspnea on exertion   PHYSICAL EXAMINATION: General appearance: alert, cooperative and no distress Head: Normocephalic, without obvious  abnormality, atraumatic Neck: no adenopathy, no JVD, supple, symmetrical, trachea midline and thyroid not enlarged, symmetric, no tenderness/mass/nodules Lymph nodes: Cervical, supraclavicular, and axillary nodes normal. Resp: clear to auscultation bilaterally Back: symmetric, no curvature. ROM normal. No CVA tenderness. Cardio: regular rate and rhythm, S1, S2 normal, no murmur, click, rub or gallop GI: soft, non-tender; bowel sounds normal; no masses,  no organomegaly Extremities: extremities normal, atraumatic, no cyanosis or edema  ECOG PERFORMANCE STATUS: 1 - Symptomatic but  completely ambulatory  Blood pressure (!) 151/67, pulse 70, temperature 98.2 F (36.8 C), temperature source Oral, resp. rate 20, height 5\' 6"  (1.676 m), weight 191 lb 14.4 oz (87 kg), SpO2 95 %.  LABORATORY DATA: Lab Results  Component Value Date   WBC 9.2 02/22/2019   HGB 16.1 02/22/2019   HCT 48.1 02/22/2019   MCV 94.5 02/22/2019   PLT 277 02/22/2019      Chemistry      Component Value Date/Time   NA 140 02/22/2019 1137   NA 139 07/17/2017 1049   K 4.7 02/22/2019 1137   K 4.4 07/17/2017 1049   CL 101 02/22/2019 1137   CO2 30 02/22/2019 1137   CO2 29 07/17/2017 1049   BUN 14 02/22/2019 1137   BUN 14.8 07/17/2017 1049   CREATININE 0.80 02/22/2019 1137   CREATININE 0.8 07/17/2017 1049      Component Value Date/Time   CALCIUM 9.4 02/22/2019 1137   CALCIUM 9.3 07/17/2017 1049   ALKPHOS 82 02/22/2019 1137   ALKPHOS 81 07/17/2017 1049   AST 13 (L) 02/22/2019 1137   AST 16 07/17/2017 1049   ALT 15 02/22/2019 1137   ALT 22 07/17/2017 1049   BILITOT 0.6 02/22/2019 1137   BILITOT 0.55 07/17/2017 1049       RADIOGRAPHIC STUDIES: Ct Chest W Contrast  Result Date: 02/22/2019 CLINICAL DATA:  Lung cancer. EXAM: CT CHEST WITH CONTRAST TECHNIQUE: Multidetector CT imaging of the chest was performed during intravenous contrast administration. CONTRAST:  44mL OMNIPAQUE IOHEXOL 300 MG/ML  SOLN COMPARISON:  11/23/2018 FINDINGS: Cardiovascular: The heart size is normal. No substantial pericardial effusion. Coronary artery calcification is evident. Atherosclerotic calcification is noted in the wall of the thoracic aorta. Pulmonary arterial enlargement suggests pulmonary arterial hypertension. Mediastinum/Nodes: No mediastinal lymphadenopathy. 2.6 cm right thyroid nodule is stable. There is no hilar lymphadenopathy. The esophagus has normal imaging features. There is no axillary lymphadenopathy. Lungs/Pleura: Centrilobular emphsyema noted. Posterior right upper lobe nodule (56/7) measures  minimally larger today at 10 x 8 mm compared to 9 x 7 mm previously. 4 mm subpleural nodule at the right base (113/7) is stable. Volume loss left hemithorax is compatible with the reported history of left upper lobe lung resection. 6 mm left lower lobe nodule (42/7) is stable in the interval. Additional scattered tiny nodular opacities are unchanged. No pleural effusion. Upper Abdomen: Subtle enhancing focus identified in the posterior right liver (134/2), stable since prior. Additional 13 mm hypervascular lesion inferior right liver (152/2) also unchanged. Additional scattered small hypervascular foci noted elsewhere in the right liver. These have all been seen on previous studies as far back as 07/17/2017 and are stable. Musculoskeletal: No worrisome lytic or sclerotic osseous abnormality. IMPRESSION: 1. Posterior right upper lobe perifissural nodule measures minimally larger at 10 mm maximum dimension. This nodule was also increased on the previous exam and, as such, shows continued slow progression. Metachronous primary or metastatic lesion a distinct concern. At this size, PET-CT would likely prove helpful to further evaluate.  2. Other scattered small bilateral pulmonary nodules are stable. 3. Pulmonary arterial enlargement compatible pulmonary arterial hypertension. 4. Stable 2.6 cm right thyroid nodule. 5.  Emphysema. (ICD10-J43.9) 6.  Aortic Atherosclerois (ICD10-170.0) Electronically Signed   By: Misty Stanley M.D.   On: 02/22/2019 16:22    ASSESSMENT AND PLAN: This is a very pleasant 78 years old white male recently diagnosed with a stage IB non-small cell lung cancer, squamous cell carcinoma presented with left upper lobe lung mass status post left upper lobectomy with lymph node dissection. The tumor size was 3.2 cm. There is no evidence for visceral pleural or lymphovascular invasion. The patient has been on observation since that time.  He is feeling fine today with no concerning complaints  except for shortness of breath with exertion. He had repeat CT scan of the chest performed recently.  I personally and independently reviewed the scan images and discussed the result and showed the images to the patient.  He continues to have a slowly growing right upper lobe pulmonary nodule that currently measures around 10 mm in diameter. The changes has been very slow compared to the CT scan 3 months ago.  I recommended for the patient to continue on observation with repeat CT scan of the chest in 3 months for reevaluation of this nodule. The patient was advised to call immediately if he has any concerning symptoms in the interval. The patient voices understanding of current disease status and treatment options and is in agreement with the current care plan. All questions were answered. The patient knows to call the clinic with any problems, questions or concerns. We can certainly see the patient much sooner if necessary.  Disclaimer: This note was dictated with voice recognition software. Similar sounding words can inadvertently be transcribed and may not be corrected upon review.

## 2019-02-27 ENCOUNTER — Telehealth: Payer: Self-pay | Admitting: Internal Medicine

## 2019-02-27 NOTE — Telephone Encounter (Signed)
Scheduled appt per 7/14 los - pt to get an updated schedule next visit.

## 2019-03-27 DIAGNOSIS — N390 Urinary tract infection, site not specified: Secondary | ICD-10-CM | POA: Diagnosis not present

## 2019-03-27 DIAGNOSIS — R310 Gross hematuria: Secondary | ICD-10-CM | POA: Diagnosis not present

## 2019-03-27 DIAGNOSIS — N420 Calculus of prostate: Secondary | ICD-10-CM | POA: Diagnosis not present

## 2019-03-27 DIAGNOSIS — R351 Nocturia: Secondary | ICD-10-CM | POA: Diagnosis not present

## 2019-05-16 DIAGNOSIS — Z23 Encounter for immunization: Secondary | ICD-10-CM | POA: Diagnosis not present

## 2019-05-29 ENCOUNTER — Other Ambulatory Visit: Payer: Medicare Other

## 2019-05-29 ENCOUNTER — Inpatient Hospital Stay: Payer: Medicare Other | Attending: Internal Medicine

## 2019-05-29 ENCOUNTER — Ambulatory Visit (HOSPITAL_COMMUNITY)
Admission: RE | Admit: 2019-05-29 | Discharge: 2019-05-29 | Disposition: A | Discharge status: Home / Self Care | Payer: Medicare Other | Source: Ambulatory Visit | Attending: Internal Medicine | Admitting: Internal Medicine

## 2019-05-29 ENCOUNTER — Other Ambulatory Visit: Payer: Self-pay

## 2019-05-29 DIAGNOSIS — C3490 Malignant neoplasm of unspecified part of unspecified bronchus or lung: Secondary | ICD-10-CM

## 2019-05-29 DIAGNOSIS — Z902 Acquired absence of lung [part of]: Secondary | ICD-10-CM | POA: Insufficient documentation

## 2019-05-29 DIAGNOSIS — C3412 Malignant neoplasm of upper lobe, left bronchus or lung: Secondary | ICD-10-CM | POA: Insufficient documentation

## 2019-05-29 DIAGNOSIS — J449 Chronic obstructive pulmonary disease, unspecified: Secondary | ICD-10-CM | POA: Diagnosis not present

## 2019-05-29 DIAGNOSIS — J439 Emphysema, unspecified: Secondary | ICD-10-CM | POA: Diagnosis not present

## 2019-05-29 LAB — CBC WITH DIFFERENTIAL (CANCER CENTER ONLY)
Abs Immature Granulocytes: 0.1 10*3/uL — ABNORMAL HIGH (ref 0.00–0.07)
Basophils Absolute: 0.1 10*3/uL (ref 0.0–0.1)
Basophils Relative: 1 %
Eosinophils Absolute: 0.4 10*3/uL (ref 0.0–0.5)
Eosinophils Relative: 3 %
HCT: 49 % (ref 39.0–52.0)
Hemoglobin: 16.4 g/dL (ref 13.0–17.0)
Immature Granulocytes: 1 %
Lymphocytes Relative: 10 %
Lymphs Abs: 1.1 10*3/uL (ref 0.7–4.0)
MCH: 31.7 pg (ref 26.0–34.0)
MCHC: 33.5 g/dL (ref 30.0–36.0)
MCV: 94.6 fL (ref 80.0–100.0)
Monocytes Absolute: 1.5 10*3/uL — ABNORMAL HIGH (ref 0.1–1.0)
Monocytes Relative: 13 %
Neutro Abs: 8.4 10*3/uL — ABNORMAL HIGH (ref 1.7–7.7)
Neutrophils Relative %: 72 %
Platelet Count: 283 10*3/uL (ref 150–400)
RBC: 5.18 MIL/uL (ref 4.22–5.81)
RDW: 13.5 % (ref 11.5–15.5)
WBC Count: 11.6 10*3/uL — ABNORMAL HIGH (ref 4.0–10.5)
nRBC: 0 % (ref 0.0–0.2)

## 2019-05-29 LAB — CMP (CANCER CENTER ONLY)
ALT: 17 U/L (ref 0–44)
AST: 14 U/L — ABNORMAL LOW (ref 15–41)
Albumin: 3.9 g/dL (ref 3.5–5.0)
Alkaline Phosphatase: 77 U/L (ref 38–126)
Anion gap: 6 (ref 5–15)
BUN: 17 mg/dL (ref 8–23)
CO2: 33 mmol/L — ABNORMAL HIGH (ref 22–32)
Calcium: 9.8 mg/dL (ref 8.9–10.3)
Chloride: 101 mmol/L (ref 98–111)
Creatinine: 0.83 mg/dL (ref 0.61–1.24)
GFR, Est AFR Am: >60 mL/min (ref >60)
GFR, Estimated: >60 mL/min (ref >60)
Glucose, Bld: 105 mg/dL — ABNORMAL HIGH (ref 70–99)
Potassium: 4.8 mmol/L (ref 3.5–5.1)
Sodium: 140 mmol/L (ref 135–145)
Total Bilirubin: 0.5 mg/dL (ref 0.3–1.2)
Total Protein: 7.3 g/dL (ref 6.5–8.1)

## 2019-05-29 MED ORDER — SODIUM CHLORIDE (PF) 0.9 % IJ SOLN
INTRAMUSCULAR | Status: AC
  Filled 2019-05-29: qty 50

## 2019-05-29 MED ORDER — IOHEXOL 300 MG/ML  SOLN
75.0000 mL | Freq: Once | INTRAMUSCULAR | Status: AC | PRN
  Administered 2019-05-29: 75 mL via INTRAVENOUS

## 2019-06-05 ENCOUNTER — Other Ambulatory Visit: Payer: Self-pay

## 2019-06-05 ENCOUNTER — Inpatient Hospital Stay (HOSPITAL_BASED_OUTPATIENT_CLINIC_OR_DEPARTMENT_OTHER): Payer: Medicare Other | Admitting: Internal Medicine

## 2019-06-05 ENCOUNTER — Encounter: Payer: Self-pay | Admitting: Internal Medicine

## 2019-06-05 VITALS — BP 161/67 | HR 80 | Temp 98.3°F | Resp 19 | Ht 66.0 in | Wt 193.7 lb

## 2019-06-05 DIAGNOSIS — R911 Solitary pulmonary nodule: Secondary | ICD-10-CM | POA: Diagnosis not present

## 2019-06-05 DIAGNOSIS — Z902 Acquired absence of lung [part of]: Secondary | ICD-10-CM | POA: Diagnosis not present

## 2019-06-05 DIAGNOSIS — J449 Chronic obstructive pulmonary disease, unspecified: Secondary | ICD-10-CM | POA: Diagnosis not present

## 2019-06-05 DIAGNOSIS — I70213 Atherosclerosis of native arteries of extremities with intermittent claudication, bilateral legs: Secondary | ICD-10-CM

## 2019-06-05 DIAGNOSIS — C3492 Malignant neoplasm of unspecified part of left bronchus or lung: Secondary | ICD-10-CM

## 2019-06-05 DIAGNOSIS — C3490 Malignant neoplasm of unspecified part of unspecified bronchus or lung: Secondary | ICD-10-CM | POA: Diagnosis not present

## 2019-06-05 DIAGNOSIS — C3412 Malignant neoplasm of upper lobe, left bronchus or lung: Secondary | ICD-10-CM | POA: Diagnosis not present

## 2019-06-05 NOTE — Progress Notes (Signed)
[facility] [phone]   [phone]  OFFICE PROGRESS NOTE  [name], MD [place] [address]  DIAGNOSIS: Stage IB (T2a, N0, M0) non-small cell lung cancer, poorly differentiated invasive squamous cell carcinoma diagnosed in April 2018  PRIOR THERAPY: Status post left upper lobectomy with lymph node dissection under the care of Dr. Roxan Hockey on 12/12/2016 with tumor size of 3.2 cm.  CURRENT THERAPY: Observation.  INTERVAL HISTORY: [name] 78 y.o. male returns to the clinic today for follow-up visit.  The patient is feeling fine today with no concerning complaints except for shortness of breath with exertion.  He denied having any chest pain, cough or hemoptysis.  He denied having any fever or chills.  He has no nausea, vomiting, diarrhea or constipation.  He has no headache or visual changes.  He has no recent weight loss or night sweats.  He is here today for evaluation with repeat CT scan of the chest for restaging of his disease.  MEDICAL HISTORY: Past Medical History:  Diagnosis Date  . Anxiety    pt. reports that he " could swing from the ......" because he is so anxious about the surgery   . Arthritis    R hand tendonitis   . COPD (chronic obstructive pulmonary disease) ([place])   . Depression   . Hyperlipidemia   . lung ca dx'd 10/2016  . Sleep apnea    cannot tolerate cpap    ALLERGIES:  is allergic to nsaids; gadolinium derivatives; and penicillins.  MEDICATIONS:  Current Outpatient Medications  Medication Sig Dispense Refill  . acetaminophen (TYLENOL) 325 MG tablet Take 2 tablets (650 mg total) by mouth every 6 (six) hours as needed for mild pain (or Fever >/= 101).    Marland Kitchen albuterol (PROVENTIL) (2.5 MG/3ML) 0.083% nebulizer solution Take 3 mLs (2.5 mg total) by nebulization every 2 (two) hours as needed for wheezing. 75 mL 3  . budesonide-formoterol (SYMBICORT) 160-4.5 MCG/ACT inhaler Inhale 2 puffs  into the lungs 2 (two) times daily.    . fexofenadine (ALLEGRA) 180 MG tablet Take 180 mg by mouth daily as needed for allergies or rhinitis.    Marland Kitchen ipratropium-albuterol (DUONEB) 0.5-2.5 (3) MG/3ML SOLN Take 3 mLs by nebulization 4 (four) times daily for 5 days. 360 mL 1  . sertraline (ZOLOFT) 100 MG tablet Take 100 mg by mouth daily.    . simvastatin (ZOCOR) 20 MG tablet Take 20 mg by mouth every evening.    . traZODone (DESYREL) 50 MG tablet Take 50 mg by mouth at bedtime.   1  . zolpidem (AMBIEN) 5 MG tablet TK 1 T PO QHS     No current facility-administered medications for this visit.     SURGICAL HISTORY:  Past Surgical History:  Procedure Laterality Date  . COLONOSCOPY W/ POLYPECTOMY    . cyst back  2011  . CYSTOSCOPY  2012   for bleeding in bladder x 3  . MOUTH SURGERY    . VIDEO ASSISTED THORACOSCOPY (VATS)/ LOBECTOMY Left 12/12/2016   Procedure: VIDEO ASSISTED THORACOSCOPY (VATS)/LEFT UPPER LOBECTOMY;  Surgeon: [name], MD;  Location: [facility] OR;  Service: Thoracic;  Laterality: Left;    REVIEW OF SYSTEMS:  A comprehensive review of systems was negative except for: Respiratory: positive for cough and dyspnea on exertion   PHYSICAL EXAMINATION: General appearance: alert, cooperative and no distress Head: Normocephalic, without obvious abnormality, atraumatic Neck: no adenopathy, no JVD, supple, symmetrical,  trachea midline and thyroid not enlarged, symmetric, no tenderness/mass/nodules Lymph nodes: Cervical, supraclavicular, and axillary nodes normal. Resp: clear to auscultation bilaterally Back: symmetric, no curvature. ROM normal. No CVA tenderness. Cardio: regular rate and rhythm, S1, S2 normal, no murmur, click, rub or gallop GI: soft, non-tender; bowel sounds normal; no masses,  no organomegaly Extremities: extremities normal, atraumatic, no cyanosis or edema  ECOG PERFORMANCE STATUS: 1 - Symptomatic but completely ambulatory  Blood pressure (!) 161/67, pulse  80, temperature 98.3 F (36.8 C), temperature source Temporal, resp. rate 19, height 5\' 6"  (1.676 m), weight 193 lb 11.2 oz (87.9 kg), SpO2 92 %.  LABORATORY DATA: Lab Results  Component Value Date   WBC 11.6 (H) 05/29/2019   HGB 16.4 05/29/2019   HCT 49.0 05/29/2019   MCV 94.6 05/29/2019   PLT 283 05/29/2019      Chemistry      Component Value Date/Time   NA 140 05/29/2019 0934   NA 139 07/17/2017 1049   K 4.8 05/29/2019 0934   K 4.4 07/17/2017 1049   CL 101 05/29/2019 0934   CO2 33 (H) 05/29/2019 0934   CO2 29 07/17/2017 1049   BUN 17 05/29/2019 0934   BUN 14.8 07/17/2017 1049   CREATININE 0.83 05/29/2019 0934   CREATININE 0.8 07/17/2017 1049      Component Value Date/Time   CALCIUM 9.8 05/29/2019 0934   CALCIUM 9.3 07/17/2017 1049   ALKPHOS 77 05/29/2019 0934   ALKPHOS 81 07/17/2017 1049   AST 14 (L) 05/29/2019 0934   AST 16 07/17/2017 1049   ALT 17 05/29/2019 0934   ALT 22 07/17/2017 1049   BILITOT 0.5 05/29/2019 0934   BILITOT 0.55 07/17/2017 1049       RADIOGRAPHIC STUDIES: Ct Chest W Contrast  Result Date: 05/29/2019 CLINICAL DATA:  Non-small cell lung cancer restaging EXAM: CT CHEST WITH CONTRAST TECHNIQUE: Multidetector CT imaging of the chest was performed during intravenous contrast administration. CONTRAST:  50mL OMNIPAQUE IOHEXOL 300 MG/ML  SOLN COMPARISON:  02/22/2019 FINDINGS: Cardiovascular: The heart size appears normal. No pericardial effusion. Aortic atherosclerosis. Lad, left circumflex and RCA coronary artery calcifications. Mediastinum/Nodes: No pleural effusion identified. Moderate changes of emphysema. Left upper lobe lung resection the pulmonary nodule within the left lower lobe measures 6 mm, image 40/10. Previously this measured the same. Within the posterior right upper lobe there is a perifissural nodule with spiculated margins measuring 1.2 cm, image 53/10. On the previous exam this measured 1 cm. On more remote study from 01/16/2018 this  nodule measured 5 mm. No additional progressive or new nodules identified bilaterally. Lungs/Pleura: Unchanged heterogeneous low-density nodule arising from inferior pole of right lobe of thyroid gland. The trachea appears patent and is midline. Normal appearance of the esophagus. No mediastinal or hilar adenopathy. Upper Abdomen: The adrenal glands appear normal. Unchanged subtle enhancing area within lateral right lobe of liver, image 131/2, favored to represent a benign abnormality. No acute abnormality. Musculoskeletal: No chest wall abnormality. No acute or significant osseous findings. IMPRESSION: 1. Continued progression of posterior right upper lobe perifissural nodule which now measures 1.2 cm and is suspicious for neoplasm. 2. Other nodules are unchanged in the interval. 3. Aortic Atherosclerosis (ICD10-I70.0) and Emphysema (ICD10-J43.9). Coronary artery calcifications 4. Unchanged right lobe of thyroid gland nodule. Electronically Signed   By: Kerby Moors M.D.   On: 05/29/2019 13:14    ASSESSMENT AND PLAN: This is a very pleasant 78 years old white male recently diagnosed with a stage IB  non-small cell lung cancer, squamous cell carcinoma presented with left upper lobe lung mass status post left upper lobectomy with lymph node dissection. The tumor size was 3.2 cm. There is no evidence for visceral pleural or lymphovascular invasion. The patient has been on observation since that time.  He is feeling fine today with no concerning complaints except for shortness of breath with exertion. The patient had repeat CT scan of the chest performed recently.  It showed continuous progression of the posterior right upper lobe perifissural nodule which now measures 1.2 cm and suspicious for neoplasm. I personally and independently reviewed the scan images and discussed the results with the patient today. I recommended for him to have a PET scan for further evaluation of this nodule and to rule out any  disease recurrence.  I will arrange for the patient to come back for follow-up visit in 2 weeks to the multidisciplinary thoracic oncology clinic for evaluation by surgery, radiation oncology as well as myself based on his PET scan results. The patient was advised to call immediately if he has any concerning symptoms in the interval. The patient voices understanding of current disease status and treatment options and is in agreement with the current care plan. All questions were answered. The patient knows to call the clinic with any problems, questions or concerns. We can certainly see the patient much sooner if necessary.  Disclaimer: This note was dictated with voice recognition software. Similar sounding words can inadvertently be transcribed and may not be corrected upon review.

## 2019-06-06 ENCOUNTER — Telehealth: Payer: Self-pay | Admitting: Internal Medicine

## 2019-06-06 NOTE — Telephone Encounter (Signed)
Per 10/21 los Follow-up visit at [facility] on June 20, 2019.I will ask Hinton Dyer to arrange the visit.

## 2019-06-07 ENCOUNTER — Telehealth: Payer: Self-pay | Admitting: *Deleted

## 2019-06-07 NOTE — Telephone Encounter (Signed)
Oncology Nurse Navigator Documentation  Oncology Nurse Navigator Flowsheets 06/07/2019  Navigator Location [facility]  Referral Date to RadOnc/MedOnc -  Navigator Encounter Type Telephone/I received a message from Dr. Julien Nordmann that he would like patient to be seen at [facility] when Dr. Roxan Hockey is at clinic.  Dr. Roxan Hockey is at clinic next on 07/04/19.  I updated Dr. Julien Nordmann and he is ok waiting until 07/04/19 for patient to be seen with Dr. Roxan Hockey and Rad Onc at [facility].    Telephone Outgoing Call  Abnormal Finding Date -  Confirmed Diagnosis Date -  Expected Surgery Date -  Multidisiplinary Clinic Date -  Patient Visit Type -  Treatment Phase -  Barriers/Navigation Needs Coordination of Care;Education  Education Other  Interventions Coordination of Care;Education  Coordination of Care Appts  Education Method Verbal  Acuity Level 2-Minimal Needs (1-2 Barriers Identified)  Acuity Level 2 -  Time Spent with Patient 30

## 2019-06-12 ENCOUNTER — Ambulatory Visit (HOSPITAL_COMMUNITY)
Admission: RE | Admit: 2019-06-12 | Discharge: 2019-06-12 | Disposition: A | Discharge status: Home / Self Care | Payer: Medicare Other | Source: Ambulatory Visit | Attending: Internal Medicine | Admitting: Internal Medicine

## 2019-06-12 ENCOUNTER — Other Ambulatory Visit: Payer: Self-pay

## 2019-06-12 DIAGNOSIS — Z79899 Other long term (current) drug therapy: Secondary | ICD-10-CM | POA: Insufficient documentation

## 2019-06-12 DIAGNOSIS — R932 Abnormal findings on diagnostic imaging of liver and biliary tract: Secondary | ICD-10-CM | POA: Diagnosis not present

## 2019-06-12 DIAGNOSIS — C3490 Malignant neoplasm of unspecified part of unspecified bronchus or lung: Secondary | ICD-10-CM | POA: Diagnosis not present

## 2019-06-12 LAB — GLUCOSE, CAPILLARY: Glucose-Capillary: 97 mg/dL (ref 70–99)

## 2019-06-12 MED ORDER — FLUDEOXYGLUCOSE F - 18 (FDG) INJECTION
9.5000 | Freq: Once | INTRAVENOUS | Status: AC | PRN
  Administered 2019-06-12: 9.5 via INTRAVENOUS

## 2019-06-18 ENCOUNTER — Telehealth: Payer: Self-pay | Admitting: *Deleted

## 2019-06-18 ENCOUNTER — Telehealth: Payer: Self-pay | Admitting: Internal Medicine

## 2019-06-18 NOTE — Telephone Encounter (Signed)
Scheduled appt per 11/3 sch message - pt is aware of appt date and time   

## 2019-06-18 NOTE — Telephone Encounter (Signed)
Notified pt per MD t" there is a small area of concern, same area that we have been watching" MD will discuss PET results further with pt at next follow up. Pt thanked me for the call and said this put him at ease as "weve been watching this same place for about 1 year." No further concerns.

## 2019-06-25 ENCOUNTER — Other Ambulatory Visit: Payer: Self-pay

## 2019-06-25 ENCOUNTER — Encounter: Payer: Self-pay | Admitting: Physician Assistant

## 2019-06-25 ENCOUNTER — Inpatient Hospital Stay: Payer: Medicare Other | Attending: Internal Medicine | Admitting: Physician Assistant

## 2019-06-25 VITALS — BP 148/78 | HR 88 | Temp 98.3°F | Resp 18 | Ht 66.0 in | Wt 194.7 lb

## 2019-06-25 DIAGNOSIS — I70213 Atherosclerosis of native arteries of extremities with intermittent claudication, bilateral legs: Secondary | ICD-10-CM | POA: Diagnosis not present

## 2019-06-25 DIAGNOSIS — C3492 Malignant neoplasm of unspecified part of left bronchus or lung: Secondary | ICD-10-CM

## 2019-06-25 DIAGNOSIS — J449 Chronic obstructive pulmonary disease, unspecified: Secondary | ICD-10-CM | POA: Diagnosis not present

## 2019-06-25 DIAGNOSIS — Z7189 Other specified counseling: Secondary | ICD-10-CM | POA: Diagnosis not present

## 2019-06-25 DIAGNOSIS — R911 Solitary pulmonary nodule: Secondary | ICD-10-CM | POA: Insufficient documentation

## 2019-06-25 DIAGNOSIS — Z85118 Personal history of other malignant neoplasm of bronchus and lung: Secondary | ICD-10-CM | POA: Diagnosis not present

## 2019-06-25 DIAGNOSIS — Z902 Acquired absence of lung [part of]: Secondary | ICD-10-CM | POA: Diagnosis not present

## 2019-06-25 NOTE — Progress Notes (Signed)
[facility] OFFICE PROGRESS NOTE  [name], MD [place] [address]  DIAGNOSIS: Stage IB (T2a, N0, M0) non-small cell lung cancer, poorly differentiated invasive squamous cell carcinoma diagnosed in April 2018. New right upper lobe 1.2 cm nodule October 2020.   PRIOR THERAPY: Status post left upper lobectomy with lymph node dissection under the care of Dr. Roxan Hockey on 12/12/2016 with tumor size of 3.2 cm.  CURRENT THERAPY: Observation.   INTERVAL HISTORY: [name] 78 y.o. male returns to the clinic for a follow-up visit accompanied by his wife.  The patient has a history of stage Ib non-small cell lung cancer status post a left upper lobectomy with lymph node dissection under the care of Dr. Roxan Hockey in April 2018.  The patient recently returned to clinic to review a routine surveillance CT scan which showed a 1.2 cm right upper lobe nodule.   Today, the patient is feeling well without any concerning complaints.  He reports his baseline shortness of breath with exertion.  He denies any recent fever, chills, night sweats, or weight loss.  He denies any chest pain, cough, or hemoptysis.  He denies any nausea, vomiting, diarrhea, or constipation.  He denies any headache or visual changes.  The patient quit smoking in 2018.  The patient recently had a PET scan performed to further evaluate this right upper lobe nodule.  He is here today for evaluation and recommendations regarding his condition.   MEDICAL HISTORY: Past Medical History:  Diagnosis Date  . Anxiety    pt. reports that he " could swing from the ......" because he is so anxious about the surgery   . Arthritis    R hand tendonitis   . COPD (chronic obstructive pulmonary disease) ([place])   . Depression   . Hyperlipidemia   . lung ca dx'd 10/2016  . Sleep apnea    cannot tolerate cpap    ALLERGIES:  is allergic to nsaids; gadolinium derivatives; and penicillins.  MEDICATIONS:   Current Outpatient Medications  Medication Sig Dispense Refill  . budesonide-formoterol (SYMBICORT) 160-4.5 MCG/ACT inhaler Inhale 2 puffs into the lungs 2 (two) times daily.    . sertraline (ZOLOFT) 100 MG tablet Take 100 mg by mouth daily.    . simvastatin (ZOCOR) 20 MG tablet Take 20 mg by mouth every evening.    . traZODone (DESYREL) 50 MG tablet Take 50 mg by mouth at bedtime.   1  . acetaminophen (TYLENOL) 325 MG tablet Take 2 tablets (650 mg total) by mouth every 6 (six) hours as needed for mild pain (or Fever >/= 101). (Patient not taking: Reported on 06/25/2019)    . albuterol (PROVENTIL) (2.5 MG/3ML) 0.083% nebulizer solution Take 3 mLs (2.5 mg total) by nebulization every 2 (two) hours as needed for wheezing. (Patient not taking: Reported on 06/25/2019) 75 mL 3  . fexofenadine (ALLEGRA) 180 MG tablet Take 180 mg by mouth daily as needed for allergies or rhinitis.    Marland Kitchen ipratropium-albuterol (DUONEB) 0.5-2.5 (3) MG/3ML SOLN Take 3 mLs by nebulization 4 (four) times daily for 5 days. 360 mL 1  . zolpidem (AMBIEN) 5 MG tablet TK 1 T PO QHS     No current facility-administered medications for this visit.     SURGICAL HISTORY:  Past Surgical History:  Procedure Laterality Date  . COLONOSCOPY W/ POLYPECTOMY    . cyst back  2011  . CYSTOSCOPY  2012   for bleeding in bladder x 3  .  MOUTH SURGERY    . VIDEO ASSISTED THORACOSCOPY (VATS)/ LOBECTOMY Left 12/12/2016   Procedure: VIDEO ASSISTED THORACOSCOPY (VATS)/LEFT UPPER LOBECTOMY;  Surgeon: [name], MD;  Location: [facility];  Service: Thoracic;  Laterality: Left;    REVIEW OF SYSTEMS:   Review of Systems  Constitutional: Negative for appetite change, chills, fatigue, fever and unexpected weight change.  HENT: Negative for mouth sores, nosebleeds, sore throat and trouble swallowing.   Eyes: Negative for eye problems and icterus.  Respiratory: Positive for dyspnea on exertion. Negative for cough, hemoptysis, and wheezing.    Cardiovascular: Negative for chest pain and leg swelling.  Gastrointestinal: Negative for abdominal pain, constipation, diarrhea, nausea and vomiting.  Genitourinary: Negative for bladder incontinence, difficulty urinating, dysuria, frequency and hematuria.   Musculoskeletal: Negative for back pain, gait problem, neck pain and neck stiffness.  Skin: Negative for itching and rash.  Neurological: Negative for dizziness, extremity weakness, gait problem, headaches, light-headedness and seizures.  Hematological: Negative for adenopathy. Does not bruise/bleed easily.  Psychiatric/Behavioral: Negative for confusion, depression and sleep disturbance. The patient is not nervous/anxious.     PHYSICAL EXAMINATION:  Blood pressure (!) 148/78, pulse 88, temperature 98.3 F (36.8 C), temperature source Temporal, resp. rate 18, height 5\' 6"  (1.676 m), weight 194 lb 11.2 oz (88.3 kg), SpO2 92 %.  ECOG PERFORMANCE STATUS: 0 - Asymptomatic  Physical Exam  Constitutional: Oriented to person, place, and time and well-developed, well-nourished, and in no distress.  HENT:  Head: Normocephalic and atraumatic.  Mouth/Throat: Oropharynx is clear and moist. No oropharyngeal exudate.  Eyes: Conjunctivae are normal. Right eye exhibits no discharge. Left eye exhibits no discharge. No scleral icterus.  Neck: Normal range of motion. Neck supple.  Cardiovascular: Normal rate, regular rhythm, normal heart sounds and intact distal pulses.   Pulmonary/Chest: Effort normal. Quiet breath sounds. No respiratory distress. No wheezes. No rales.  Abdominal: Soft. Bowel sounds are normal. Exhibits no distension and no mass. There is no tenderness.  Musculoskeletal: Normal range of motion. Exhibits no edema.  Lymphadenopathy:    No cervical adenopathy.  Neurological: Alert and oriented to person, place, and time. Exhibits normal muscle tone. Gait normal. Coordination normal.  Skin: Skin is warm and dry. No rash noted. Not  diaphoretic. No erythema. No pallor.  Psychiatric: Mood, memory and judgment normal.  Vitals reviewed.  LABORATORY DATA: Lab Results  Component Value Date   WBC 11.6 (H) 05/29/2019   HGB 16.4 05/29/2019   HCT 49.0 05/29/2019   MCV 94.6 05/29/2019   PLT 283 05/29/2019      Chemistry      Component Value Date/Time   NA 140 05/29/2019 0934   NA 139 07/17/2017 1049   K 4.8 05/29/2019 0934   K 4.4 07/17/2017 1049   CL 101 05/29/2019 0934   CO2 33 (H) 05/29/2019 0934   CO2 29 07/17/2017 1049   BUN 17 05/29/2019 0934   BUN 14.8 07/17/2017 1049   CREATININE 0.83 05/29/2019 0934   CREATININE 0.8 07/17/2017 1049      Component Value Date/Time   CALCIUM 9.8 05/29/2019 0934   CALCIUM 9.3 07/17/2017 1049   ALKPHOS 77 05/29/2019 0934   ALKPHOS 81 07/17/2017 1049   AST 14 (L) 05/29/2019 0934   AST 16 07/17/2017 1049   ALT 17 05/29/2019 0934   ALT 22 07/17/2017 1049   BILITOT 0.5 05/29/2019 0934   BILITOT 0.55 07/17/2017 1049       RADIOGRAPHIC STUDIES:  Ct Chest W Contrast  Result Date: 05/29/2019 CLINICAL DATA:  Non-small cell lung cancer restaging EXAM: CT CHEST WITH CONTRAST TECHNIQUE: Multidetector CT imaging of the chest was performed during intravenous contrast administration. CONTRAST:  62mL OMNIPAQUE IOHEXOL 300 MG/ML  SOLN COMPARISON:  02/22/2019 FINDINGS: Cardiovascular: The heart size appears normal. No pericardial effusion. Aortic atherosclerosis. Lad, left circumflex and RCA coronary artery calcifications. Mediastinum/Nodes: No pleural effusion identified. Moderate changes of emphysema. Left upper lobe lung resection the pulmonary nodule within the left lower lobe measures 6 mm, image 40/10. Previously this measured the same. Within the posterior right upper lobe there is a perifissural nodule with spiculated margins measuring 1.2 cm, image 53/10. On the previous exam this measured 1 cm. On more remote study from 01/16/2018 this nodule measured 5 mm. No additional  progressive or new nodules identified bilaterally. Lungs/Pleura: Unchanged heterogeneous low-density nodule arising from inferior pole of right lobe of thyroid gland. The trachea appears patent and is midline. Normal appearance of the esophagus. No mediastinal or hilar adenopathy. Upper Abdomen: The adrenal glands appear normal. Unchanged subtle enhancing area within lateral right lobe of liver, image 131/2, favored to represent a benign abnormality. No acute abnormality. Musculoskeletal: No chest wall abnormality. No acute or significant osseous findings. IMPRESSION: 1. Continued progression of posterior right upper lobe perifissural nodule which now measures 1.2 cm and is suspicious for neoplasm. 2. Other nodules are unchanged in the interval. 3. Aortic Atherosclerosis (ICD10-I70.0) and Emphysema (ICD10-J43.9). Coronary artery calcifications 4. Unchanged right lobe of thyroid gland nodule. Electronically Signed   By: Kerby Moors M.D.   On: 05/29/2019 13:14   Nm Pet Image Restag (ps) Skull Base To Thigh  Result Date: 06/12/2019 CLINICAL DATA:  Subsequent treatment strategy for lung cancer. EXAM: NUCLEAR MEDICINE PET SKULL BASE TO THIGH TECHNIQUE: 9.5 mCi F-18 FDG was injected intravenously. Full-ring PET imaging was performed from the skull base to thigh after the radiotracer. CT data was obtained and used for attenuation correction and anatomic localization. Fasting blood glucose: 97 mg/dl COMPARISON:  CT chest 05/29/2019 and PET-CT 11/24/2016 FINDINGS: Mediastinal blood pool activity: SUV max 3.14 Liver activity: SUV max NA NECK: No hypermetabolic lymph nodes in the neck. Incidental CT findings: none CHEST: Persistent nodule within the subpleural aspect of the posterior right upper lobe is again noted. On today's exam this measures 1.1 cm and has SUV max of 2.85. Status post left upper lobe lung resection. Superior segment left lower lobe lung nodule is stable measuring 5 mm. This is too small to reliably  characterize by PET-CT. Incidental CT findings: Multinodular thyroid gland. Aortic atherosclerosis. Coronary artery calcifications identified. ABDOMEN/PELVIS: Within segment 3 of the liver there is a small focus of FDG uptake above liver background activity with SUV max of 5.6 (compared with 4.6 background liver activity). No abnormal uptake within the pancreas, spleen, adrenal glands or lymph nodes. Incidental CT findings: Aortic atherosclerosis. Colonic diverticulosis noted. Prostate gland enlargement and prostate calcifications again noted. SKELETON: No focal hypermetabolic activity to suggest skeletal metastasis. Incidental CT findings: none IMPRESSION: 1. There is mild increased FDG uptake associated with the small posterior right upper lobe lung nodule. Nodule measures 1.1 cm and has an SUV max of 2.85. This is suspicious for either a focus of metastatic disease or new primary lung neoplasm. 2. Small focus of mild increased FDG uptake within segment 3 of the liver. The degree of uptake is above background liver activity. There is no CT correlate on today's exam or on the contrast enhanced CT from 05/29/2019.  This warrants close attention on follow-up imaging. Alternatively, more definitive assessment of the liver may be obtained with a contrast enhanced MRI of the liver. Electronically Signed   By: Kerby Moors M.D.   On: 06/12/2019 09:37     ASSESSMENT/PLAN:  This is a very pleasant 78 year old Caucasian male initially diagnosed with stage Ib non-small cell lung cancer poorly differentiated invasive squamous cell carcinoma.  He is status post a left upper lobectomy with lymph node dissection under the care of Dr. Roxan Hockey in April 2018.  The tumor was 3.2 cm without any evidence of visceral pleural or lymphovascular invasion.  The patient had been on observation and feeling well.  He recently had a repeat CT scan performed which showed continued progression of the left upper lobe perifissural  nodule now measuring 1.2 cm.   The patient had a PET scan performed to further evaluate this nodule.  Dr. Julien Nordmann personally and independently reviewed the scan and discussed the results with the patient today.  His PET scan showed a mildly hypermetabolic activity of this nodule with an SUV max of 2.85.  Dr. Julien Nordmann recommends that the patient meet with Dr. Roxan Hockey in cardiothoracic surgery for consideration of surgical removal of this lesion vs. Continuing on observation vs. Consideration of SBRT.   The patient will be seen at the multidisciplinary conference next week on 11/19.   We will see the patient back in 3-4 months with a repeat CT of the chest.   The patient was advised to call immediately if she has any concerning symptoms in the interval. The patient voices understanding of current disease status and treatment options and is in agreement with the current care plan. All questions were answered. The patient knows to call the clinic with any problems, questions or concerns. We can certainly see the patient much sooner if necessary   Orders Placed This Encounter  Procedures  . CT Chest W Contrast    Standing Status:   Future    Standing Expiration Date:   06/24/2020    Order Specific Question:   ** REASON FOR EXAM (FREE TEXT)    Answer:   Restaging Lung Cancer    Order Specific Question:   If indicated for the ordered procedure, I authorize the administration of contrast media per Radiology protocol    Answer:   Yes    Order Specific Question:   Preferred imaging location?    Answer:   [facility]    Order Specific Question:   Radiology Contrast Protocol - do NOT remove file path    Answer:   [email or web address]     Angalina Ante L Connie Lasater, PA-C 06/25/19  ADDENDUM: Hematology/Oncology Attending: I had a face-to-face encounter with the patient today.  I recommended his care plan.  This is a very pleasant 78 years old white male with  history of stage Ib non-small cell lung cancer, poorly differentiated squamous cell carcinoma status post left upper lobectomy with lymph node dissection under the care of Dr. Roxan Hockey in April 2018 and has been on observation since that time. Recent imaging studies including CT scan of the chest showed suspicious right upper lobe nodule. I ordered a PET scan which was performed recently and that showed mild activity in the suspicious right upper lobe nodule concerning for early malignancy. I personally and independently reviewed the PET scan images and discussed the results with the patient and his wife.  I recommended for him to see with Dr. Roxan Hockey for evaluation and discussion of  surgical option or biopsy. If the patient is not a good surgical candidate, he may be considered for SBRT to this pulmonary nodule or continuous observation and close monitoring. The patient and his wife agreed to the current plan. We will see him back for follow-up visit in around 3 months for reevaluation whether he had any intervention in the time being or not. He was advised to call immediately if he has any other concerning symptoms in the interval.  Disclaimer: This note was dictated with voice recognition software. Similar sounding words can inadvertently be transcribed and may be missed upon review. Eilleen Kempf, MD 06/25/19

## 2019-06-26 ENCOUNTER — Telehealth: Payer: Self-pay | Admitting: Internal Medicine

## 2019-06-26 NOTE — Telephone Encounter (Signed)
Scheduled per los. Called and spoke with patient. Confirmed appt 

## 2019-07-04 ENCOUNTER — Encounter: Payer: Self-pay | Admitting: *Deleted

## 2019-07-04 ENCOUNTER — Other Ambulatory Visit: Payer: Self-pay | Admitting: *Deleted

## 2019-07-04 ENCOUNTER — Ambulatory Visit: Payer: Medicare Other | Admitting: Radiation Oncology

## 2019-07-04 DIAGNOSIS — C3492 Malignant neoplasm of unspecified part of left bronchus or lung: Secondary | ICD-10-CM

## 2019-07-04 NOTE — Progress Notes (Signed)
The proposed treatment plan is for discussion purpose only and is not a binding recommendation.  The patient was not physically examined nor present for their treatment options.  Therefore, final treatment plans cannot be decided.   PFT's ordered per Dr. Julien Nordmann

## 2019-07-16 ENCOUNTER — Encounter: Payer: Self-pay | Admitting: Thoracic Surgery (Cardiothoracic Vascular Surgery)

## 2019-07-16 ENCOUNTER — Institutional Professional Consult (permissible substitution) (INDEPENDENT_AMBULATORY_CARE_PROVIDER_SITE_OTHER): Payer: Medicare Other | Admitting: Thoracic Surgery (Cardiothoracic Vascular Surgery)

## 2019-07-16 ENCOUNTER — Other Ambulatory Visit: Payer: Self-pay

## 2019-07-16 VITALS — BP 161/64 | HR 53 | Temp 97.5°F | Resp 16 | Ht 66.0 in | Wt 194.0 lb

## 2019-07-16 DIAGNOSIS — C3432 Malignant neoplasm of lower lobe, left bronchus or lung: Secondary | ICD-10-CM

## 2019-07-16 DIAGNOSIS — I70213 Atherosclerosis of native arteries of extremities with intermittent claudication, bilateral legs: Secondary | ICD-10-CM | POA: Diagnosis not present

## 2019-07-16 DIAGNOSIS — D381 Neoplasm of uncertain behavior of trachea, bronchus and lung: Secondary | ICD-10-CM | POA: Diagnosis not present

## 2019-07-16 DIAGNOSIS — Z902 Acquired absence of lung [part of]: Secondary | ICD-10-CM | POA: Diagnosis not present

## 2019-07-16 NOTE — Progress Notes (Signed)
PCP is Avva, Steva Ready, MD Referring Provider is Curt Bears, MD  Chief Complaint  Patient presents with  . Lung Lesion    new RULobe..per CT CHEST 05/29/19, PET 06/12/19...Marland KitchenMarland Kitchenh/o LULobectomy 12/12/16    HPI: [name] returns for consultation regarding a right upper lobe lung nodule.  [name] is an 78 year old man with a past medical history significant for tobacco abuse, COPD, lingular sparing left upper lobectomy for stage Ib squamous cell carcinoma, obesity, sleep apnea, anxiety, and depression.  He underwent a lingular sparing left upper lobectomy in April 2018.  He did not have any immediate postoperative complications and went home on day 5.  He says he had a lot of pain initially but by the time I saw him back in follow-up he was doing well.  Of note his pulmonary function testing was marginal prior to that with an FEV1 of 0.89, which improved 1.01 or 41% of predicted with bronchodilators.  He has been followed since that time.  He was noted to have a new nodule in the posterior aspect of the right upper lobe.  That was followed over time and it has slowly grown.  His most recent CT showed the nodule to be 1.2 cm up from 1 cm in July.  On PET CT it was mildly hypermetabolic with an SUV of 1.61.  He quit smoking prior to his previous surgery in 2018.  He has not had repeat pulmonary function testing since then.  He gets short of breath with bending over.  Also gets some shortness of breath with exertion but is less noticeable.  He has a good appetite and has been gaining not losing weight.  He notices occasional wheezing.  He is not having any chest pain, pressure, or tightness.  Zubrod Score: At the time of surgery this patient's most appropriate activity status/level should be described as: []     0    Normal activity, no symptoms [x]     1    Restricted in physical strenuous activity but ambulatory, able to do out light work []     2    Ambulatory and capable of self care,  unable to do work activities, up and about >50 % of waking hours                              []     3    Only limited self care, in bed greater than 50% of waking hours []     4    Completely disabled, no self care, confined to bed or chair []     5    Moribund  Past Medical History:  Diagnosis Date  . Anxiety    pt. reports that he " could swing from the ......" because he is so anxious about the surgery   . Arthritis    R hand tendonitis   . COPD (chronic obstructive pulmonary disease) ([place])   . Depression   . Hyperlipidemia   . lung ca dx'd 10/2016  . Sleep apnea    cannot tolerate cpap    Past Surgical History:  Procedure Laterality Date  . COLONOSCOPY W/ POLYPECTOMY    . cyst back  2011  . CYSTOSCOPY  2012   for bleeding in bladder x 3  . MOUTH SURGERY    . VIDEO ASSISTED THORACOSCOPY (VATS)/ LOBECTOMY Left 12/12/2016   Procedure: VIDEO ASSISTED THORACOSCOPY (VATS)/LEFT UPPER LOBECTOMY;  Surgeon: [name], MD;  Location:  [facility] OR;  Service: Thoracic;  Laterality: Left;    Family History  Problem Relation Age of Onset  . Colon cancer Brother   . Emphysema Other   . Heart disease Other   . Cancer Other   . Heart disease Father   . Heart disease Maternal Uncle   . Heart disease Paternal Uncle     Social History Social History   Tobacco Use  . Smoking status: Former Smoker    Packs/day: 0.50    Types: Cigarettes    Quit date: 11/29/2016    Years since quitting: 2.6  . Smokeless tobacco: Never Used  Substance Use Topics  . Alcohol use: No  . Drug use: No    Current Outpatient Medications  Medication Sig Dispense Refill  . acetaminophen (TYLENOL) 325 MG tablet Take 2 tablets (650 mg total) by mouth every 6 (six) hours as needed for mild pain (or Fever >/= 101).    Marland Kitchen albuterol (PROVENTIL) (2.5 MG/3ML) 0.083% nebulizer solution Take 3 mLs (2.5 mg total) by nebulization every 2 (two) hours as needed for wheezing. 75 mL 3  . budesonide-formoterol (SYMBICORT)  160-4.5 MCG/ACT inhaler Inhale 2 puffs into the lungs 2 (two) times daily.    . fexofenadine (ALLEGRA) 180 MG tablet Take 180 mg by mouth daily as needed for allergies or rhinitis.    Marland Kitchen sertraline (ZOLOFT) 100 MG tablet Take 100 mg by mouth daily.    . simvastatin (ZOCOR) 20 MG tablet Take 20 mg by mouth every evening.    . traZODone (DESYREL) 50 MG tablet Take 50 mg by mouth at bedtime.   1   No current facility-administered medications for this visit.     Allergies  Allergen Reactions  . Nsaids Other (See Comments)    MD RESTRICTS USE OF ANY "BLOOD THINING" MEDS [ "DUE TO GROWTH IN BLADDER" ]   . Gadolinium Derivatives Nausea And Vomiting and Other (See Comments)    CHILLS SHAKING FEELING COLD  . Penicillins Other (See Comments)    "Feels like my skin is crawling"  Has patient had a PCN reaction causing immediate rash, facial/tongue/throat swelling, SOB or lightheadedness with hypotension: No Has patient had a PCN reaction causing severe rash involving mucus membranes or skin necrosis: No Has patient had a PCN reaction that required hospitalization No Has patient had a PCN reaction occurring within the last 10 years: No If all of the above answers are "NO", then may proceed with Cephalosporin use.     Review of Systems  Constitutional: Negative for activity change, appetite change and unexpected weight change.  HENT: Negative for trouble swallowing and voice change.   Respiratory: Positive for shortness of breath and wheezing. Negative for cough.   Cardiovascular: Negative for chest pain and leg swelling.  Gastrointestinal: Negative for abdominal distention and abdominal pain.  Genitourinary: Negative for difficulty urinating and dysuria.  Musculoskeletal: Positive for arthralgias.       Leg cramps  Skin:       Itching  Neurological: Negative for seizures, syncope and weakness.  Hematological: Negative for adenopathy. Bruises/bleeds easily.  Psychiatric/Behavioral:  Positive for dysphoric mood. The patient is nervous/anxious.     BP (!) 161/64 (BP Location: Right Arm, Patient Position: Sitting, Cuff Size: Normal)   Pulse (!) 53   Temp (!) 97.5 F (36.4 C)   Resp 16   Ht 5\' 6"  (1.676 m)   Wt 194 lb (88 kg)   SpO2 94% Comment: RA  BMI 31.31 kg/m  Physical Exam Vitals signs reviewed.  Constitutional:      General: He is not in acute distress.    Appearance: He is obese.  HENT:     Head: Normocephalic and atraumatic.  Eyes:     General: No scleral icterus.    Extraocular Movements: Extraocular movements intact.  Neck:     Musculoskeletal: Neck supple.  Cardiovascular:     Rate and Rhythm: Normal rate and regular rhythm.     Heart sounds: Normal heart sounds. No murmur. No friction rub. No gallop.   Pulmonary:     Effort: Pulmonary effort is normal. No respiratory distress.     Breath sounds: No wheezing or rales.  Abdominal:     General: There is no distension.     Palpations: Abdomen is soft.     Tenderness: There is no abdominal tenderness.  Musculoskeletal:        General: No swelling.  Lymphadenopathy:     Cervical: No cervical adenopathy.  Skin:    General: Skin is warm and dry.  Neurological:     General: No focal deficit present.     Mental Status: He is alert and oriented to person, place, and time.     Cranial Nerves: No cranial nerve deficit.     Motor: No weakness.    Diagnostic Tests: CT CHEST WITH CONTRAST  TECHNIQUE: Multidetector CT imaging of the chest was performed during intravenous contrast administration.  CONTRAST:  94mL OMNIPAQUE IOHEXOL 300 MG/ML  SOLN  COMPARISON:  02/22/2019  FINDINGS: Cardiovascular: The heart size appears normal. No pericardial effusion. Aortic atherosclerosis. Lad, left circumflex and RCA coronary artery calcifications.  Mediastinum/Nodes: No pleural effusion identified. Moderate changes of emphysema. Left upper lobe lung resection the pulmonary nodule within the left  lower lobe measures 6 mm, image 40/10. Previously this measured the same. Within the posterior right upper lobe there is a perifissural nodule with spiculated margins measuring 1.2 cm, image 53/10. On the previous exam this measured 1 cm. On more remote study from 01/16/2018 this nodule measured 5 mm.  No additional progressive or new nodules identified bilaterally.  Lungs/Pleura: Unchanged heterogeneous low-density nodule arising from inferior pole of right lobe of thyroid gland. The trachea appears patent and is midline. Normal appearance of the esophagus. No mediastinal or hilar adenopathy.  Upper Abdomen: The adrenal glands appear normal. Unchanged subtle enhancing area within lateral right lobe of liver, image 131/2, favored to represent a benign abnormality. No acute abnormality.  Musculoskeletal: No chest wall abnormality. No acute or significant osseous findings.  IMPRESSION: 1. Continued progression of posterior right upper lobe perifissural nodule which now measures 1.2 cm and is suspicious for neoplasm. 2. Other nodules are unchanged in the interval. 3. Aortic Atherosclerosis (ICD10-I70.0) and Emphysema (ICD10-J43.9). Coronary artery calcifications 4. Unchanged right lobe of thyroid gland nodule.   Electronically Signed   By: Kerby Moors M.D.   On: 05/29/2019 13:14 NUCLEAR MEDICINE PET SKULL BASE TO THIGH  TECHNIQUE: 9.5 mCi F-18 FDG was injected intravenously. Full-ring PET imaging was performed from the skull base to thigh after the radiotracer. CT data was obtained and used for attenuation correction and anatomic localization.  Fasting blood glucose: 97 mg/dl  COMPARISON:  CT chest 05/29/2019 and PET-CT 11/24/2016  FINDINGS: Mediastinal blood pool activity: SUV max 3.14  Liver activity: SUV max NA  NECK: No hypermetabolic lymph nodes in the neck.  Incidental CT findings: none  CHEST: Persistent nodule within the subpleural aspect of  the posterior right  upper lobe is again noted. On today's exam this measures 1.1 cm and has SUV max of 2.85.  Status post left upper lobe lung resection.  Superior segment left lower lobe lung nodule is stable measuring 5 mm. This is too small to reliably characterize by PET-CT.  Incidental CT findings: Multinodular thyroid gland. Aortic atherosclerosis. Coronary artery calcifications identified.  ABDOMEN/PELVIS: Within segment 3 of the liver there is a small focus of FDG uptake above liver background activity with SUV max of 5.6 (compared with 4.6 background liver activity). No abnormal uptake within the pancreas, spleen, adrenal glands or lymph nodes.  Incidental CT findings: Aortic atherosclerosis. Colonic diverticulosis noted. Prostate gland enlargement and prostate calcifications again noted.  SKELETON: No focal hypermetabolic activity to suggest skeletal metastasis.  Incidental CT findings: none  IMPRESSION: 1. There is mild increased FDG uptake associated with the small posterior right upper lobe lung nodule. Nodule measures 1.1 cm and has an SUV max of 2.85. This is suspicious for either a focus of metastatic disease or new primary lung neoplasm. 2. Small focus of mild increased FDG uptake within segment 3 of the liver. The degree of uptake is above background liver activity. There is no CT correlate on today's exam or on the contrast enhanced CT from 05/29/2019. This warrants close attention on follow-up imaging. Alternatively, more definitive assessment of the liver may be obtained with a contrast enhanced MRI of the liver.   Electronically Signed   By: Kerby Moors M.D.   On: 06/12/2019 09:37 I personally reviewed the CT and PET/CT images and concur with the findings noted above.  Impression: [name] is an 78 year old man with a past medical history significant for tobacco abuse, COPD, obesity, dyslipidemia, anxiety, depression, and sleep apnea.   He had a stage Ib squamous cell carcinoma removed from the left upper lobe with a lingular sparing left upper lobectomy in April 2018.  He now has a slowly enlarging right upper lobe nodule that measures 1.2 cm.  It also has activity on PET with an SUV max of 2.85.  This is highly suspicious for a second primary.  Metastatic disease is not out of the question but I would expect to see more rapid growth if that were the case.  Infectious and inflammatory nodules are also in the differential diagnosis, but are far less likely given his history.  We discussed 3 options.  The first would be continued radiographic follow-up, second would be stereotactic radiation, and third would be surgical resection.  We discussed the pros and cons of each of those approaches.  From a surgical standpoint we would plan to do a right VATS for wedge resection or posterior segmentectomy.  I do not think he would tolerate a lobectomy given his PFTs from 2018.fortunately, the nodule is small and peripheral, so I think we can get an adequate margin without having to do a lobectomy.   I described the general nature of the procedure to him.  He is familiar with it due to his similar operation on the other side.  He understands the need for general anesthesia, the incisions to be used, the use of drains to use postoperatively, the expected hospital stay, and the overall recovery.  I informed him of the indications, risks, benefits, and alternatives.  He understands the risks include, but are not limited to death, MI, DVT, PE, bleeding, possible need for transfusion, infection, prolonged air leak, cardiac arrhythmias, as well as the possibility of other unforeseeable complications.  He  wishes to meet with radiation oncology before making a decision.  He also is leaning towards waiting and getting another CT scan in 3 months.  I do not think that is going to change our opinion and recommended that he go ahead with either surgery or  stereotactic radiation once he is made up his mind.  If he does decide to pursue surgery, we will need to repeat his pulmonary function testing preoperatively  Plan: Meet with radiation oncology He will call and let us know how he would like to proceed.  [name], MD [facility] [phone]

## 2019-08-01 ENCOUNTER — Telehealth: Payer: Self-pay | Admitting: *Deleted

## 2019-08-01 NOTE — Telephone Encounter (Signed)
Oncology Nurse Navigator Documentation  Oncology Nurse Navigator Flowsheets 08/01/2019  Abnormal Finding Date -  Confirmed Diagnosis Date -  Expected Surgery Date -  Navigator Location [facility]  Referral Date to RadOnc/MedOnc -  Navigator Encounter Type Telephone/I followed up with Dr. Roxan Hockey about [name] treatment plan and was updated to help coordinate appt with rad onc.  I called [name] and we spoke about his wishes for plan of care.  He states he would like to get a follow up ct scan in February and then see Dr. Julien Nordmann.  He does not want to see Rad Onc at this time.  I will update Dr. Roxan Hockey and Dr. Julien Nordmann.   Telephone Outgoing Call  [facility] Date -  Multidisiplinary Clinic Type -  Patient Visit Type Other  Treatment Phase Post-Tx Follow-up  Barriers/Navigation Needs Coordination of Care;Education  Education Other  Interventions Coordination of Care;Education  Acuity Level 2-Minimal Needs (1-2 Barriers Identified)  Coordination of Care Other  Education Method Verbal  Time Spent with Patient 30

## 2019-09-06 ENCOUNTER — Telehealth: Payer: Self-pay | Admitting: Internal Medicine

## 2019-09-06 NOTE — Telephone Encounter (Signed)
Returned patient's phone call regarding rescheduling 02/09 appointment, per 01/22 scheduled message appointment has moved to 02/10.

## 2019-09-06 NOTE — Telephone Encounter (Signed)
Patient returned phone call regarding voicemail that was left, per patient's request 02/11 appointment has moved to 02/15.

## 2019-09-10 DIAGNOSIS — Z125 Encounter for screening for malignant neoplasm of prostate: Secondary | ICD-10-CM | POA: Diagnosis not present

## 2019-09-10 DIAGNOSIS — E0590 Thyrotoxicosis, unspecified without thyrotoxic crisis or storm: Secondary | ICD-10-CM | POA: Diagnosis not present

## 2019-09-10 DIAGNOSIS — E7849 Other hyperlipidemia: Secondary | ICD-10-CM | POA: Diagnosis not present

## 2019-09-11 ENCOUNTER — Encounter: Payer: Self-pay | Admitting: Internal Medicine

## 2019-09-11 DIAGNOSIS — I10 Essential (primary) hypertension: Secondary | ICD-10-CM | POA: Diagnosis not present

## 2019-09-11 DIAGNOSIS — R82998 Other abnormal findings in urine: Secondary | ICD-10-CM | POA: Diagnosis not present

## 2019-09-16 DIAGNOSIS — Z0000 Encounter for general adult medical examination without abnormal findings: Secondary | ICD-10-CM | POA: Diagnosis not present

## 2019-09-16 DIAGNOSIS — I10 Essential (primary) hypertension: Secondary | ICD-10-CM | POA: Diagnosis not present

## 2019-09-16 DIAGNOSIS — K219 Gastro-esophageal reflux disease without esophagitis: Secondary | ICD-10-CM | POA: Diagnosis not present

## 2019-09-16 DIAGNOSIS — N200 Calculus of kidney: Secondary | ICD-10-CM | POA: Diagnosis not present

## 2019-09-16 DIAGNOSIS — M545 Low back pain: Secondary | ICD-10-CM | POA: Diagnosis not present

## 2019-09-16 DIAGNOSIS — C3490 Malignant neoplasm of unspecified part of unspecified bronchus or lung: Secondary | ICD-10-CM | POA: Diagnosis not present

## 2019-09-16 DIAGNOSIS — M79606 Pain in leg, unspecified: Secondary | ICD-10-CM | POA: Diagnosis not present

## 2019-09-16 DIAGNOSIS — E785 Hyperlipidemia, unspecified: Secondary | ICD-10-CM | POA: Diagnosis not present

## 2019-09-16 DIAGNOSIS — E669 Obesity, unspecified: Secondary | ICD-10-CM | POA: Diagnosis not present

## 2019-09-16 DIAGNOSIS — J449 Chronic obstructive pulmonary disease, unspecified: Secondary | ICD-10-CM | POA: Diagnosis not present

## 2019-09-16 DIAGNOSIS — F329 Major depressive disorder, single episode, unspecified: Secondary | ICD-10-CM | POA: Diagnosis not present

## 2019-09-16 DIAGNOSIS — Z1331 Encounter for screening for depression: Secondary | ICD-10-CM | POA: Diagnosis not present

## 2019-09-16 DIAGNOSIS — G4733 Obstructive sleep apnea (adult) (pediatric): Secondary | ICD-10-CM | POA: Diagnosis not present

## 2019-09-19 DIAGNOSIS — Z1212 Encounter for screening for malignant neoplasm of rectum: Secondary | ICD-10-CM | POA: Diagnosis not present

## 2019-09-23 ENCOUNTER — Other Ambulatory Visit: Payer: Self-pay | Admitting: Medical Oncology

## 2019-09-23 DIAGNOSIS — C3492 Malignant neoplasm of unspecified part of left bronchus or lung: Secondary | ICD-10-CM

## 2019-09-24 ENCOUNTER — Other Ambulatory Visit: Payer: Medicare Other

## 2019-09-25 ENCOUNTER — Encounter (HOSPITAL_COMMUNITY): Payer: Self-pay | Admitting: Radiology

## 2019-09-25 ENCOUNTER — Ambulatory Visit (HOSPITAL_COMMUNITY)
Admission: RE | Admit: 2019-09-25 | Discharge: 2019-09-25 | Disposition: A | Discharge status: Home / Self Care | Payer: Medicare Other | Source: Ambulatory Visit | Attending: Physician Assistant | Admitting: Physician Assistant

## 2019-09-25 ENCOUNTER — Other Ambulatory Visit: Payer: Medicare Other

## 2019-09-25 ENCOUNTER — Inpatient Hospital Stay: Payer: Medicare Other | Attending: Internal Medicine

## 2019-09-25 ENCOUNTER — Other Ambulatory Visit: Payer: Self-pay

## 2019-09-25 DIAGNOSIS — I10 Essential (primary) hypertension: Secondary | ICD-10-CM | POA: Diagnosis not present

## 2019-09-25 DIAGNOSIS — Z85118 Personal history of other malignant neoplasm of bronchus and lung: Secondary | ICD-10-CM | POA: Diagnosis not present

## 2019-09-25 DIAGNOSIS — C3492 Malignant neoplasm of unspecified part of left bronchus or lung: Secondary | ICD-10-CM | POA: Insufficient documentation

## 2019-09-25 DIAGNOSIS — C3490 Malignant neoplasm of unspecified part of unspecified bronchus or lung: Secondary | ICD-10-CM | POA: Diagnosis not present

## 2019-09-25 DIAGNOSIS — Z902 Acquired absence of lung [part of]: Secondary | ICD-10-CM | POA: Insufficient documentation

## 2019-09-25 LAB — CBC WITH DIFFERENTIAL (CANCER CENTER ONLY)
Abs Immature Granulocytes: 0.09 10*3/uL — ABNORMAL HIGH (ref 0.00–0.07)
Basophils Absolute: 0.1 10*3/uL (ref 0.0–0.1)
Basophils Relative: 1 %
Eosinophils Absolute: 0.3 10*3/uL (ref 0.0–0.5)
Eosinophils Relative: 4 %
HCT: 50.2 % (ref 39.0–52.0)
Hemoglobin: 17.1 g/dL — ABNORMAL HIGH (ref 13.0–17.0)
Immature Granulocytes: 1 %
Lymphocytes Relative: 10 %
Lymphs Abs: 0.9 10*3/uL (ref 0.7–4.0)
MCH: 31.5 pg (ref 26.0–34.0)
MCHC: 34.1 g/dL (ref 30.0–36.0)
MCV: 92.6 fL (ref 80.0–100.0)
Monocytes Absolute: 1 10*3/uL (ref 0.1–1.0)
Monocytes Relative: 13 %
Neutro Abs: 6 10*3/uL (ref 1.7–7.7)
Neutrophils Relative %: 71 %
Platelet Count: 263 10*3/uL (ref 150–400)
RBC: 5.42 MIL/uL (ref 4.22–5.81)
RDW: 13.3 % (ref 11.5–15.5)
WBC Count: 8.3 10*3/uL (ref 4.0–10.5)
nRBC: 0 % (ref 0.0–0.2)

## 2019-09-25 LAB — CMP (CANCER CENTER ONLY)
ALT: 17 U/L (ref 0–44)
AST: 13 U/L — ABNORMAL LOW (ref 15–41)
Albumin: 3.9 g/dL (ref 3.5–5.0)
Alkaline Phosphatase: 79 U/L (ref 38–126)
Anion gap: 7 (ref 5–15)
BUN: 16 mg/dL (ref 8–23)
CO2: 32 mmol/L (ref 22–32)
Calcium: 9.4 mg/dL (ref 8.9–10.3)
Chloride: 102 mmol/L (ref 98–111)
Creatinine: 0.79 mg/dL (ref 0.61–1.24)
GFR, Est AFR Am: >60 mL/min (ref >60)
GFR, Estimated: >60 mL/min (ref >60)
Glucose, Bld: 105 mg/dL — ABNORMAL HIGH (ref 70–99)
Potassium: 4.6 mmol/L (ref 3.5–5.1)
Sodium: 141 mmol/L (ref 135–145)
Total Bilirubin: 0.5 mg/dL (ref 0.3–1.2)
Total Protein: 7.5 g/dL (ref 6.5–8.1)

## 2019-09-25 MED ORDER — IOHEXOL 300 MG/ML  SOLN
75.0000 mL | Freq: Once | INTRAMUSCULAR | Status: AC | PRN
  Administered 2019-09-25: 75 mL via INTRAVENOUS

## 2019-09-25 MED ORDER — SODIUM CHLORIDE (PF) 0.9 % IJ SOLN
INTRAMUSCULAR | Status: AC
  Filled 2019-09-25: qty 50

## 2019-09-26 ENCOUNTER — Ambulatory Visit: Payer: Medicare Other | Admitting: Internal Medicine

## 2019-09-30 ENCOUNTER — Encounter: Payer: Self-pay | Admitting: Internal Medicine

## 2019-09-30 ENCOUNTER — Other Ambulatory Visit: Payer: Self-pay

## 2019-09-30 ENCOUNTER — Inpatient Hospital Stay (HOSPITAL_BASED_OUTPATIENT_CLINIC_OR_DEPARTMENT_OTHER): Payer: Medicare Other | Admitting: Internal Medicine

## 2019-09-30 VITALS — BP 141/64 | HR 79 | Temp 98.3°F | Resp 20 | Wt 198.0 lb

## 2019-09-30 DIAGNOSIS — I10 Essential (primary) hypertension: Secondary | ICD-10-CM | POA: Diagnosis not present

## 2019-09-30 DIAGNOSIS — C3492 Malignant neoplasm of unspecified part of left bronchus or lung: Secondary | ICD-10-CM

## 2019-09-30 DIAGNOSIS — C3490 Malignant neoplasm of unspecified part of unspecified bronchus or lung: Secondary | ICD-10-CM

## 2019-09-30 DIAGNOSIS — Z85118 Personal history of other malignant neoplasm of bronchus and lung: Secondary | ICD-10-CM | POA: Diagnosis not present

## 2019-09-30 DIAGNOSIS — Z902 Acquired absence of lung [part of]: Secondary | ICD-10-CM | POA: Diagnosis not present

## 2019-09-30 NOTE — Progress Notes (Signed)
[name] [phone]   [phone]  OFFICE PROGRESS NOTE  David Solian, MD [place] [address]  DIAGNOSIS: Stage IB (T2a, N0, M0) non-small cell lung cancer, poorly differentiated invasive squamous cell carcinoma diagnosed in April 2018  PRIOR THERAPY: Status post left upper lobectomy with lymph node dissection under the care of Dr. Roxan Hockey on 12/12/2016 with tumor size of 3.2 cm.  CURRENT THERAPY: Observation.  INTERVAL HISTORY: [name] 79 y.o. male returns to the clinic today for follow-up visit.  The patient is feeling fine today with no concerning complaints except for the baseline shortness of breath increased with exertion.  He denied having any recent weight loss or night sweats.  He has no nausea, vomiting, diarrhea or constipation.  He denied having any headache or visual changes.  He has no fever or chills.  The patient had repeat CT scan of the chest performed recently and is here for evaluation and discussion of his scan results.  MEDICAL HISTORY: Past Medical History:  Diagnosis Date  . Anxiety    pt. reports that he " could swing from the ......" because he is so anxious about the surgery   . Arthritis    R hand tendonitis   . COPD (chronic obstructive pulmonary disease) ([place])   . Depression   . Hyperlipidemia   . lung ca dx'd 10/2016  . Sleep apnea    cannot tolerate cpap    ALLERGIES:  is allergic to nsaids; gadolinium derivatives; and penicillins.  MEDICATIONS:  Current Outpatient Medications  Medication Sig Dispense Refill  . budesonide-formoterol (SYMBICORT) 160-4.5 MCG/ACT inhaler Inhale 2 puffs into the lungs 2 (two) times daily.    . fexofenadine (ALLEGRA) 180 MG tablet Take 180 mg by mouth daily as needed for allergies or rhinitis.    Marland Kitchen sertraline (ZOLOFT) 100 MG tablet Take 100 mg by mouth daily.    . simvastatin (ZOCOR) 20 MG tablet Take 20 mg by mouth every evening.    .  traZODone (DESYREL) 50 MG tablet Take 50 mg by mouth at bedtime.   1  . acetaminophen (TYLENOL) 325 MG tablet Take 2 tablets (650 mg total) by mouth every 6 (six) hours as needed for mild pain (or Fever >/= 101). (Patient not taking: Reported on 09/30/2019)    . albuterol (PROVENTIL) (2.5 MG/3ML) 0.083% nebulizer solution Take 3 mLs (2.5 mg total) by nebulization every 2 (two) hours as needed for wheezing. (Patient not taking: Reported on 09/30/2019) 75 mL 3   No current facility-administered medications for this visit.    SURGICAL HISTORY:  Past Surgical History:  Procedure Laterality Date  . COLONOSCOPY W/ POLYPECTOMY    . cyst back  2011  . CYSTOSCOPY  2012   for bleeding in bladder x 3  . MOUTH SURGERY    . VIDEO ASSISTED THORACOSCOPY (VATS)/ LOBECTOMY Left 12/12/2016   Procedure: VIDEO ASSISTED THORACOSCOPY (VATS)/LEFT UPPER LOBECTOMY;  Surgeon: [name], MD;  Location: [facility];  Service: Thoracic;  Laterality: Left;    REVIEW OF SYSTEMS:  Constitutional: positive for fatigue Eyes: negative Ears, nose, mouth, throat, and face: negative Respiratory: positive for dyspnea on exertion Cardiovascular: negative Gastrointestinal: negative Genitourinary:negative Integument/breast: negative Hematologic/lymphatic: negative Musculoskeletal:negative Neurological: negative Behavioral/Psych: negative Endocrine: negative Allergic/Immunologic: negative   PHYSICAL EXAMINATION: General appearance: alert, cooperative, fatigued and no distress Head: Normocephalic, without obvious abnormality, atraumatic Neck: no adenopathy, no JVD, supple, symmetrical, trachea midline and thyroid not enlarged, symmetric,  no tenderness/mass/nodules Lymph nodes: Cervical, supraclavicular, and axillary nodes normal. Resp: clear to auscultation bilaterally Back: symmetric, no curvature. ROM normal. No CVA tenderness. Cardio: regular rate and rhythm, S1, S2 normal, no murmur, click, rub or gallop GI:  soft, non-tender; bowel sounds normal; no masses,  no organomegaly Extremities: extremities normal, atraumatic, no cyanosis or edema Neurologic: Alert and oriented X 3, normal strength and tone. Normal symmetric reflexes. Normal coordination and gait  ECOG PERFORMANCE STATUS: 1 - Symptomatic but completely ambulatory  Blood pressure (!) 141/64, pulse 79, temperature 98.3 F (36.8 C), temperature source Oral, resp. rate 20, weight 198 lb 0.3 oz (89.8 kg), SpO2 96 %.  LABORATORY DATA: Lab Results  Component Value Date   WBC 8.3 09/25/2019   HGB 17.1 (H) 09/25/2019   HCT 50.2 09/25/2019   MCV 92.6 09/25/2019   PLT 263 09/25/2019      Chemistry      Component Value Date/Time   NA 141 09/25/2019 0940   NA 139 07/17/2017 1049   K 4.6 09/25/2019 0940   K 4.4 07/17/2017 1049   CL 102 09/25/2019 0940   CO2 32 09/25/2019 0940   CO2 29 07/17/2017 1049   BUN 16 09/25/2019 0940   BUN 14.8 07/17/2017 1049   CREATININE 0.79 09/25/2019 0940   CREATININE 0.8 07/17/2017 1049      Component Value Date/Time   CALCIUM 9.4 09/25/2019 0940   CALCIUM 9.3 07/17/2017 1049   ALKPHOS 79 09/25/2019 0940   ALKPHOS 81 07/17/2017 1049   AST 13 (L) 09/25/2019 0940   AST 16 07/17/2017 1049   ALT 17 09/25/2019 0940   ALT 22 07/17/2017 1049   BILITOT 0.5 09/25/2019 0940   BILITOT 0.55 07/17/2017 1049       RADIOGRAPHIC STUDIES: CT Chest W Contrast  Result Date: 09/25/2019 CLINICAL DATA:  Lung cancer.  New right upper lobe nodule. EXAM: CT CHEST WITH CONTRAST TECHNIQUE: Multidetector CT imaging of the chest was performed during intravenous contrast administration. CONTRAST:  74mL OMNIPAQUE IOHEXOL 300 MG/ML  SOLN COMPARISON:  PET 06/12/2019 and CT chest 05/29/2019,, 11/23/2018 and 07/17/2017. FINDINGS: Cardiovascular: Atherosclerotic calcification of the aorta and coronary arteries. Pulmonic trunk is enlarged. Heart size within normal limits. No pericardial effusion. Mediastinum/Nodes: Low-attenuation  right thyroid nodule measures 2.3 cm, as before. No pathologically enlarged mediastinal, hilar or axillary lymph nodes. Esophagus is grossly unremarkable. Lungs/Pleura: Centrilobular and paraseptal emphysema. Irregular nodule in the posterior segment right upper lobe has enlarged, now measuring 10 x 15 mm (7/38), previously 8 x 12 mm on 05/29/2019 and 7 x 9 mm on 11/23/2018. Postoperative volume loss in the medial left upper lobe. 6 mm left lower lobe nodule (7/26), stable from 07/17/2017 and considered benign. No pleural fluid. Debris is seen at the orifice of the left mainstem bronchus Upper Abdomen: Blushes of hyper attenuation in the right hepatic lobe may represent perfusion anomalies or flash fill hemangiomas. Visualized portions of the liver, gallbladder and right adrenal gland are unremarkable. Slight thickening of the left adrenal gland. Visualized portions of the kidneys, spleen, pancreas, stomach and bowel are unremarkable. Duodenal diverticulum is incidentally noted. No upper abdominal adenopathy. Musculoskeletal: Degenerative changes in the spine. No worrisome lytic or sclerotic lesions. IMPRESSION: 1. Enlarging irregular posterior segment right upper lobe nodule, highly worrisome for bronchogenic carcinoma. 2. Right thyroid nodule. Recommend thyroid US (ref: J Am Coll Radiol. 2015 Feb;12(2): 143-50). 3. Aortic atherosclerosis (ICD10-I70.0). Coronary artery calcification. 4. Pulmonic trunk is enlarged, indicative of pulmonary arterial hypertension. 5.  Emphysema (ICD10-J43.9). Electronically Signed   By: Lorin Picket M.D.   On: 09/25/2019 14:18    ASSESSMENT AND PLAN: This is a very pleasant 79 years old white male recently diagnosed with a stage IB non-small cell lung cancer, squamous cell carcinoma presented with left upper lobe lung mass status post left upper lobectomy with lymph node dissection. The tumor size was 3.2 cm. There is no evidence for visceral pleural or lymphovascular invasion.  The patient has been in observation since that time and he has been doing fine. Repeated imaging studies over the last year showed evidence for enlarging posterior segment right upper lobe nodule worrisome for bronchogenic carcinoma and it showed activity on the previous PET scan.  The patient was evaluated by Dr. Roxan Hockey and he consider him for surgical resection versus SBRT versus continuous observation.  The patient chose continuous observation at that time but the recent scan showed further enlargement of the nodule. I personally and independently reviewed the scans and discussed the results with the patient today. I recommended for him to see Dr. Roxan Hockey again for discussion of the surgical option and if the patient is not a good surgical candidate, he may benefit from SBRT to this nodule. I will see him back for follow-up visit in 4 months for evaluation after either the surgical intervention or radiation with restaging scan of the chest. For hypertension he was advised to take his blood pressure medication as prescribed and to monitor it closely at home. The patient was advised to call immediately if he has any concerning symptoms in the interval.   The patient voices understanding of current disease status and treatment options and is in agreement with the current care plan. All questions were answered. The patient knows to call the clinic with any problems, questions or concerns. We can certainly see the patient much sooner if necessary.  Disclaimer: This note was dictated with voice recognition software. Similar sounding words can inadvertently be transcribed and may not be corrected upon review.

## 2019-10-02 ENCOUNTER — Other Ambulatory Visit: Payer: Self-pay

## 2019-10-02 ENCOUNTER — Institutional Professional Consult (permissible substitution) (INDEPENDENT_AMBULATORY_CARE_PROVIDER_SITE_OTHER): Payer: Medicare Other | Admitting: Thoracic Surgery (Cardiothoracic Vascular Surgery)

## 2019-10-02 ENCOUNTER — Encounter: Payer: Self-pay | Admitting: Thoracic Surgery (Cardiothoracic Vascular Surgery)

## 2019-10-02 VITALS — BP 145/73 | HR 90 | Temp 87.7°F | Resp 16 | Ht 66.0 in | Wt 194.0 lb

## 2019-10-02 DIAGNOSIS — Z902 Acquired absence of lung [part of]: Secondary | ICD-10-CM

## 2019-10-02 DIAGNOSIS — D381 Neoplasm of uncertain behavior of trachea, bronchus and lung: Secondary | ICD-10-CM

## 2019-10-02 DIAGNOSIS — C3432 Malignant neoplasm of lower lobe, left bronchus or lung: Secondary | ICD-10-CM

## 2019-10-02 NOTE — Progress Notes (Signed)
PCP is Avva, Steva Ready, MD Referring Provider is Curt Bears, MD  Chief Complaint  Patient presents with  . Lung Lesion    RULobe...enlarging per recent CT CHEST 09/25/19    HPI: [name] returns to discuss treatment of his right upper lobe lung nodule.  [name] is an 79 year old man with a past history of tobacco abuse, COPD, lingular sparing left upper lobectomy, stage Ib squamous cell carcinoma, obesity, sleep apnea, anxiety, arthritis, and depression.  He had a long history of tobacco abuse prior to quitting smoking in 2018 just prior to his lingular sparing lobectomy.  His FEV1 at the time was 0.89 which improved to 1.01 with bronchodilators.  He did extremely well and went home on day 5.  A follow-up CT showed a new nodule in the posterior aspect of the right upper lobe.  On follow-up that nodule has continued to slowly increase in size.  A PET in October showed the nodule was hypermetabolic with an SUV of 6.26 which is not insignificant for a 1.1 cm nodule.  I saw him in December and recommended wedge resection but also offered the option of stereotactic radiation.  He did not want any intervention at that time and wished to wait until after his CT which was scheduled for February.  He saw Dr. Julien Nordmann and now is referred back.  He says he recently stopped taking his inhalers, both Symbicort and Proventil.  He was feeling congested and short of breath and thought those were making it worse.  He has noted some improvement in his breathing since he stopped those medications  Zubrod Score: At the time of surgery this patient's most appropriate activity status/level should be described as: []     0    Normal activity, no symptoms [x]     1    Restricted in physical strenuous activity but ambulatory, able to do out light work []     2    Ambulatory and capable of self care, unable to do work activities, up and about >50 % of waking hours                              []     3    Only  limited self care, in bed greater than 50% of waking hours []     4    Completely disabled, no self care, confined to bed or chair []     5    Moribund   Past Medical History:  Diagnosis Date  . Anxiety    pt. reports that he " could swing from the ......" because he is so anxious about the surgery   . Arthritis    R hand tendonitis   . COPD (chronic obstructive pulmonary disease) ([place])   . Depression   . Hyperlipidemia   . lung ca dx'd 10/2016  . Sleep apnea    cannot tolerate cpap    Past Surgical History:  Procedure Laterality Date  . COLONOSCOPY W/ POLYPECTOMY    . cyst back  2011  . CYSTOSCOPY  2012   for bleeding in bladder x 3  . MOUTH SURGERY    . VIDEO ASSISTED THORACOSCOPY (VATS)/ LOBECTOMY Left 12/12/2016   Procedure: VIDEO ASSISTED THORACOSCOPY (VATS)/LEFT UPPER LOBECTOMY;  Surgeon: [name], MD;  Location: [facility];  Service: Thoracic;  Laterality: Left;    Family History  Problem Relation Age of Onset  . Colon cancer Brother   .  Emphysema Other   . Heart disease Other   . Cancer Other   . Heart disease Father   . Heart disease Maternal Uncle   . Heart disease Paternal Uncle     Social History Social History   Tobacco Use  . Smoking status: Former Smoker    Packs/day: 0.50    Types: Cigarettes    Quit date: 11/29/2016    Years since quitting: 2.8  . Smokeless tobacco: Never Used  Substance Use Topics  . Alcohol use: No  . Drug use: No    Current Outpatient Medications  Medication Sig Dispense Refill  . fexofenadine (ALLEGRA) 180 MG tablet Take 180 mg by mouth daily as needed for allergies or rhinitis.    Marland Kitchen sertraline (ZOLOFT) 100 MG tablet Take 100 mg by mouth daily.    . simvastatin (ZOCOR) 20 MG tablet Take 20 mg by mouth every evening.    . traZODone (DESYREL) 50 MG tablet Take 50 mg by mouth at bedtime.   1  . acetaminophen (TYLENOL) 325 MG tablet Take 2 tablets (650 mg total) by mouth every 6 (six) hours as needed for mild pain (or  Fever >/= 101). (Patient not taking: Reported on 09/30/2019)    . albuterol (PROVENTIL) (2.5 MG/3ML) 0.083% nebulizer solution Take 3 mLs (2.5 mg total) by nebulization every 2 (two) hours as needed for wheezing. (Patient not taking: Reported on 09/30/2019) 75 mL 3  . budesonide-formoterol (SYMBICORT) 160-4.5 MCG/ACT inhaler Inhale 2 puffs into the lungs 2 (two) times daily.     No current facility-administered medications for this visit.    Allergies  Allergen Reactions  . Nsaids Other (See Comments)    MD RESTRICTS USE OF ANY "BLOOD THINING" MEDS [ "DUE TO GROWTH IN BLADDER" ]   . Gadolinium Derivatives Nausea And Vomiting and Other (See Comments)    CHILLS SHAKING FEELING COLD  . Penicillins Other (See Comments)    "Feels like my skin is crawling"  Has patient had a PCN reaction causing immediate rash, facial/tongue/throat swelling, SOB or lightheadedness with hypotension: No Has patient had a PCN reaction causing severe rash involving mucus membranes or skin necrosis: No Has patient had a PCN reaction that required hospitalization No Has patient had a PCN reaction occurring within the last 10 years: No If all of the above answers are "NO", then may proceed with Cephalosporin use.     Review of Systems  Constitutional: Negative for activity change and unexpected weight change.  HENT: Positive for hearing loss.   Respiratory: Positive for apnea, shortness of breath (Better after stopping Symbicort) and wheezing (Better after stopping Symbicort).   Cardiovascular: Negative for chest pain, palpitations and leg swelling.  Genitourinary: Positive for frequency and urgency.  Musculoskeletal: Positive for arthralgias and gait problem.  Skin: Positive for rash.  Neurological: Positive for numbness.  Hematological: Negative for adenopathy. Bruises/bleeds easily.  Psychiatric/Behavioral: Positive for dysphoric mood.  All other systems reviewed and are negative.   BP (!) 145/73 (BP  Location: Right Arm, Patient Position: Sitting, Cuff Size: Normal)   Pulse 90   Temp (!) 87.7 F (30.9 C)   Resp 16   Ht 5\' 6"  (1.676 m)   Wt 194 lb (88 kg)   SpO2 92% Comment: RA  BMI 31.31 kg/m  Physical Exam Vitals reviewed.  Constitutional:      General: He is not in acute distress.    Appearance: Normal appearance.  HENT:     Head: Normocephalic and atraumatic.  Eyes:     General: No scleral icterus.    Extraocular Movements: Extraocular movements intact.  Cardiovascular:     Rate and Rhythm: Normal rate and regular rhythm.     Heart sounds: No murmur. No gallop.   Pulmonary:     Effort: Pulmonary effort is normal. No respiratory distress.     Breath sounds: No wheezing or rales.  Abdominal:     General: There is no distension.     Palpations: Abdomen is soft.     Tenderness: There is no abdominal tenderness.  Musculoskeletal:        General: No swelling.     Cervical back: Neck supple.  Lymphadenopathy:     Cervical: No cervical adenopathy.  Skin:    General: Skin is warm and dry.  Neurological:     General: No focal deficit present.     Mental Status: He is alert and oriented to person, place, and time.     Cranial Nerves: No cranial nerve deficit.     Motor: No weakness.    Diagnostic Tests: CT CHEST WITH CONTRAST  TECHNIQUE: Multidetector CT imaging of the chest was performed during intravenous contrast administration.  CONTRAST:  34mL OMNIPAQUE IOHEXOL 300 MG/ML  SOLN  COMPARISON:  PET 06/12/2019 and CT chest 05/29/2019,, 11/23/2018 and 07/17/2017.  FINDINGS: Cardiovascular: Atherosclerotic calcification of the aorta and coronary arteries. Pulmonic trunk is enlarged. Heart size within normal limits. No pericardial effusion.  Mediastinum/Nodes: Low-attenuation right thyroid nodule measures 2.3 cm, as before. No pathologically enlarged mediastinal, hilar or axillary lymph nodes. Esophagus is grossly unremarkable.  Lungs/Pleura:  Centrilobular and paraseptal emphysema. Irregular nodule in the posterior segment right upper lobe has enlarged, now measuring 10 x 15 mm (7/38), previously 8 x 12 mm on 05/29/2019 and 7 x 9 mm on 11/23/2018. Postoperative volume loss in the medial left upper lobe. 6 mm left lower lobe nodule (7/26), stable from 07/17/2017 and considered benign. No pleural fluid. Debris is seen at the orifice of the left mainstem bronchus  Upper Abdomen: Blushes of hyper attenuation in the right hepatic lobe may represent perfusion anomalies or flash fill hemangiomas. Visualized portions of the liver, gallbladder and right adrenal gland are unremarkable. Slight thickening of the left adrenal gland. Visualized portions of the kidneys, spleen, pancreas, stomach and bowel are unremarkable. Duodenal diverticulum is incidentally noted. No upper abdominal adenopathy.  Musculoskeletal: Degenerative changes in the spine. No worrisome lytic or sclerotic lesions.  IMPRESSION: 1. Enlarging irregular posterior segment right upper lobe nodule, highly worrisome for bronchogenic carcinoma. 2. Right thyroid nodule. Recommend thyroid US (ref: J Am Coll Radiol. 2015 Feb;12(2): 143-50). 3. Aortic atherosclerosis (ICD10-I70.0). Coronary artery calcification. 4. Pulmonic trunk is enlarged, indicative of pulmonary arterial hypertension. 5.  Emphysema (ICD10-J43.9).   Electronically Signed   By: Lorin Picket M.D.   On: 09/25/2019 14:18 I personally reviewed the CT images and concur with the findings noted above.  Of note his thyroid nodule has been stable over time.  Impression: [name] is an 79 year old man with a past history of tobacco abuse, COPD, lingular sparing left upper lobectomy, stage Ib squamous cell carcinoma, obesity, sleep apnea, anxiety, arthritis, and depression.  He had a lingular sparing left upper lobectomy in 2018 for a stage Ib squamous cell carcinoma.  He has been followed since that  time.  On multiple recent CT scans he has had a slowly enlarging spiculated nodule in the posterior aspect of the right upper lobe.  On PET CT that  nodule was hypermetabolic.  Given his age, appearance of the nodule, smoking history, and history of lung cancer this almost certainly is a new primary bronchogenic carcinoma.  Infectious and inflammatory nodules are also in the differential, but are far less likely.  I emphasized the importance of Mr. Whitford that it was time to treat this nodule.  Surgery and radiation both are reasonable options that have relative advantages and disadvantages.  We discussed those in detail.  The proposed surgical procedure would be a robotic right VATS for wedge resection and node sampling.  I informed him of the general nature of the procedure.  He is familiar given his previous VATS resection.  He understands the need for general anesthesia, the incisions to be used, the use of a drainage tube postoperatively, the expected hospital stay, and the overall recovery.  I informed him of the indications, risk, benefits, and alternatives.  He understands the risk include, but not limited to death, MI, DVT, PE, bleeding, possible need for transfusion, infection, prolonged air leak, cardiac arrhythmias, as well as the possibility of other unforeseeable complications.  He is leaning toward surgery but would like to talk to radiation oncology before making a final decision.   Plan: Arrange for radiation oncology consultation Return in 2 weeks  [name], MD [facility] [phone]

## 2019-10-03 ENCOUNTER — Encounter: Payer: Medicare Other | Admitting: Thoracic Surgery (Cardiothoracic Vascular Surgery)

## 2019-10-07 NOTE — Progress Notes (Signed)
Thoracic Location of Tumor / Histology: Right upper lobe lung nodule  Patient presented for follow-up scans and was found to have a new nodule in the right upper lobe.  CT Chest 09/25/2019: Enlarging irregular posterior segment right upper lobe nodule, highly worrisome for bronchogenic carcinoma.  Right thyroid nodule, recommend thyroid US.  PET 06/12/2019: There is mild increased FDG uptake associated with the small posterior right upper lobe lung nodule.  Nodule measures 1.1 cm and has an SUV max of 2.85.  This is suspicious for either a focus of metastatic disease or new primary lung neoplasm.  Small focus of mild increased FDG uptake within segment 3 of the liver.  The degree of uptake is above background liver activity.  CT Chest 05/29/2019:Continued progression of posterior right upper lobe perifissural nodule which now measures 1.2 cm and is suspicious for neoplasm.  Biopsies of   Tobacco/Marijuana/Snuff/ETOH use: Former smoker, quit in 2018.  Past/Anticipated interventions by cardiothoracic surgery, if any:  Dr. Roxan Hockey 10/02/2019 -I saw him in December and recommended wedge resection but also offered the option of stereotactic radiation.  He did not want any intervention at that time and wished to wait until after his CT which was scheduled for February.  He saw Dr. Julien Nordmann and now is referred back. -He had a lingular sparing left upper lobectomy in 2018 for a stage Ib squamous cell carcinoma.  He has been followed since that time. -On multiple recent CT scans he has had a slowly enlarging spiculated nodule in the posterior aspect of the right upper lobe. -Given his age, appearance of the nodule, smoking history, and history of lung cancer this almost certainly is a new primary bronchogenic carcinoma. -I emphasized the importance to Mr. Sebesta that it is time to treat this nodule.  Surgery and radiation both are reasonable options that have relative advantages and disadvantages. -The  proposed surgical procedure would be a robotic right VATS for wedge resection and node sampling. -He is leaning toward surgery but would like to talk to radiation oncology before making a final decision.  Past/Anticipated interventions by medical oncology, if any:  Dr. Julien Nordmann 09/30/2019 -This is a very pleasant 79 years old white male recently diagnosed with a stage IB non-small cell lung cancer, squamous cell carcinoma presented with left upper lobe lung mass status post left upper lobectomy with lymph node dissection.  The tumor size was 3.2 cm. There is no evidence for visceral pleural or lymphovascular invasion. The patient has been in observation since that time and he has been doing fine.   -Repeated imaging studies over the last year showed evidence for enlarging posterior segment right upper lobe nodule worrisome for bronchogenic carcinoma and it showed activity on the previous PET scan.  The patient was evaluated by Dr. Roxan Hockey and he consider him for surgical resection versus SBRT versus continuous observation.  The patient chose continuous observation at that time but the recent scan showed further enlargement of the nodule. -I personally and independently reviewed the scans and discussed the results with the patient today. -I recommended for him to see Dr. Roxan Hockey again for discussion of the surgical option and if the patient is not a good surgical candidate, he may benefit from SBRT to this nodule. -I will see him back for follow-up visit in 4 months for evaluation after either the surgical intervention or radiation with restaging scan of the chest    Signs/Symptoms  Weight changes, if any:  Weight has been stable.  Respiratory complaints,  if any: Has some SOB with exertion.  Hemoptysis, if any: Productive cough, pale colored phlegm.  No blood noted.  Pain issues, if any: No   SAFETY ISSUES:  Prior radiation? No  Pacemaker/ICD? No  Possible current pregnancy?  n/a  Is the patient on methotrexate? No  Current Complaints / other details:   -Previous stage Ib squamous cell carcinoma of left upper lobe, s/p lingular sparing left upper lobectomy.

## 2019-10-08 ENCOUNTER — Encounter: Payer: Self-pay | Admitting: *Deleted

## 2019-10-08 ENCOUNTER — Ambulatory Visit
Admission: RE | Admit: 2019-10-08 | Discharge: 2019-10-08 | Disposition: A | Discharge status: Home / Self Care | Payer: Medicare Other | Source: Ambulatory Visit | Attending: Radiation Oncology | Admitting: Radiation Oncology

## 2019-10-08 ENCOUNTER — Other Ambulatory Visit: Payer: Self-pay

## 2019-10-08 ENCOUNTER — Encounter: Payer: Self-pay | Admitting: Radiation Oncology

## 2019-10-08 VITALS — Ht 66.0 in | Wt 194.0 lb

## 2019-10-08 DIAGNOSIS — Z902 Acquired absence of lung [part of]: Secondary | ICD-10-CM | POA: Diagnosis not present

## 2019-10-08 DIAGNOSIS — C3412 Malignant neoplasm of upper lobe, left bronchus or lung: Secondary | ICD-10-CM | POA: Diagnosis not present

## 2019-10-08 DIAGNOSIS — R911 Solitary pulmonary nodule: Secondary | ICD-10-CM | POA: Diagnosis not present

## 2019-10-08 DIAGNOSIS — C3492 Malignant neoplasm of unspecified part of left bronchus or lung: Secondary | ICD-10-CM

## 2019-10-08 DIAGNOSIS — Z87891 Personal history of nicotine dependence: Secondary | ICD-10-CM | POA: Diagnosis not present

## 2019-10-08 NOTE — Progress Notes (Signed)
Radiation Oncology         [phone] ________________________________  Initial Outpatient Consultation - Conducted via telephone due to current COVID-19 concerns for limiting patient exposure  I spoke with the patient to conduct this consult visit via telephone to spare the patient unnecessary potential exposure in the healthcare setting during the current COVID-19 pandemic. The patient was notified in advance and was offered a [facility] meeting to allow for face to face communication but unfortunately reported that they did not have the appropriate resources/technology to support such a visit and instead preferred to proceed with a telephone consult.  ________________________________  Name: [name]        MRN: [id number]  Date of Service: 10/08/2019 DOB: [birth year]  WU:JWJX, Ravisankar, MD  [name], *     REFERRING PHYSICIAN: [name], *   DIAGNOSIS: The primary encounter diagnosis was Stage I squamous cell carcinoma of left lung ([place]). A diagnosis of Nodule of upper lobe of right lung was also pertinent to this visit.   HISTORY OF PRESENT ILLNESS: [name] is a 79 y.o. male seen at the request of Dr. Roxan Hockey for a history of early stage lung cancer. He was found to have a mass in the LUL in 2018, and underwent VATS left upper lobectomy. Final pathology revealed pT2N0 poorly differentiated squamous cell carcinoma. He has been followed by Dr. Julien Nordmann in surveillance since. He did have a RUL nodule that was seen during surveillance intervals. In June 2019, a RUL nodule was noted in 2019, and on 07/20/18 this measured 8 mm, previously 5 mm. It measured 9 x 7 mm on scan in April 2020, and in July 2020 this was 10 x 8 mm. In October 2020, this measured 12 mm. A PET on 06/12/2019 measured the lesion at 11 mm with an SUV of 2.85, and blood pool of 3.14. He was offered surgical resection in December 2020, but declined, and then agreed to repeat imaging which was  performed on 09/25/19 measuring 10 x 15 mm, and again no progressive changes were seen elsewhere. He met back with Dr. Roxan Hockey, and was offered robotic right VATs for wedge resection. He is planning to follow up next week to make further decisions with Dr. Roxan Hockey, but is contacted by phone today to discuss alternatives.    PREVIOUS RADIATION THERAPY: No   PAST MEDICAL HISTORY:  Past Medical History:  Diagnosis Date  . Anxiety    pt. reports that he " could swing from the ......" because he is so anxious about the surgery   . Arthritis    R hand tendonitis   . COPD (chronic obstructive pulmonary disease) ([place])   . Depression   . Hyperlipidemia   . lung ca dx'd 10/2016  . Sleep apnea    cannot tolerate cpap       PAST SURGICAL HISTORY: Past Surgical History:  Procedure Laterality Date  . COLONOSCOPY W/ POLYPECTOMY    . cyst back  2011  . CYSTOSCOPY  2012   for bleeding in bladder x 3  . MOUTH SURGERY    . VIDEO ASSISTED THORACOSCOPY (VATS)/ LOBECTOMY Left 12/12/2016   Procedure: VIDEO ASSISTED THORACOSCOPY (VATS)/LEFT UPPER LOBECTOMY;  Surgeon: [name], MD;  Location: [facility];  Service: Thoracic;  Laterality: Left;     FAMILY HISTORY:  Family History  Problem Relation Age of Onset  . Colon cancer Brother   . Emphysema Other   . Heart disease Other   .  Cancer Other   . Heart disease Father   . Heart disease Maternal Uncle   . Heart disease Paternal Uncle      SOCIAL HISTORY:  reports that he quit smoking about 2 years ago. His smoking use included cigarettes. He smoked 0.50 packs per day. He has never used smokeless tobacco. He reports that he does not drink alcohol or use drugs.   ALLERGIES: Nsaids, Gadolinium derivatives, and Penicillins   MEDICATIONS:  Current Outpatient Medications  Medication Sig Dispense Refill  . acetaminophen (TYLENOL) 325 MG tablet Take 2 tablets (650 mg total) by mouth every 6 (six) hours as needed for mild pain (or  Fever >/= 101).    . budesonide-formoterol (SYMBICORT) 160-4.5 MCG/ACT inhaler Inhale 2 puffs into the lungs 2 (two) times daily.    . fexofenadine (ALLEGRA) 180 MG tablet Take 180 mg by mouth daily as needed for allergies or rhinitis.    Marland Kitchen levofloxacin (LEVAQUIN) 500 MG tablet Take 500 mg by mouth daily.    . sertraline (ZOLOFT) 100 MG tablet Take 100 mg by mouth daily.    . simvastatin (ZOCOR) 20 MG tablet Take 20 mg by mouth every evening.    . traZODone (DESYREL) 50 MG tablet Take 50 mg by mouth at bedtime.   1  . albuterol (PROVENTIL) (2.5 MG/3ML) 0.083% nebulizer solution Take 3 mLs (2.5 mg total) by nebulization every 2 (two) hours as needed for wheezing. (Patient not taking: Reported on 09/30/2019) 75 mL 3   No current facility-administered medications for this encounter.     REVIEW OF SYSTEMS: On review of systems, the patient reports that he is doing well overall. He denies any chest pain, on occasion, he notes exertional shortness of breath. He also has productive cough at times with phlegm but no hemoptysis, fevers, chills, night sweats, or unintended weight changes. He denies any bowel or bladder disturbances, and denies abdominal pain, nausea or vomiting. He denies any new musculoskeletal or joint aches or pains. A complete review of systems is obtained and is otherwise negative.     PHYSICAL EXAM:  Wt Readings from Last 3 Encounters:  10/08/19 194 lb (88 kg)  10/02/19 194 lb (88 kg)  09/30/19 198 lb 0.3 oz (89.8 kg)   Unable to assess due to encounter type.  ECOG = 0  0 - Asymptomatic (Fully active, able to carry on all predisease activities without restriction)  1 - Symptomatic but completely ambulatory (Restricted in physically strenuous activity but ambulatory and able to carry out work of a light or sedentary nature. For example, light housework, office work)  2 - Symptomatic, <50% in bed during the day (Ambulatory and capable of all self care but unable to carry out  any work activities. Up and about more than 50% of waking hours)  3 - Symptomatic, >50% in bed, but not bedbound (Capable of only limited self-care, confined to bed or chair 50% or more of waking hours)  4 - Bedbound (Completely disabled. Cannot carry on any self-care. Totally confined to bed or chair)  5 - Death   Eustace Pen MM, Creech RH, Tormey DC, et al. [phone]). "Toxicity and response criteria of the [facility]". [place] Oncol. 5 (6): 649-55    LABORATORY DATA:  Lab Results  Component Value Date   WBC 8.3 09/25/2019   HGB 17.1 (H) 09/25/2019   HCT 50.2 09/25/2019   MCV 92.6 09/25/2019   PLT 263 09/25/2019   Lab Results  Component Value  Date   NA 141 09/25/2019   K 4.6 09/25/2019   CL 102 09/25/2019   CO2 32 09/25/2019   Lab Results  Component Value Date   ALT 17 09/25/2019   AST 13 (L) 09/25/2019   ALKPHOS 79 09/25/2019   BILITOT 0.5 09/25/2019      RADIOGRAPHY: CT Chest W Contrast  Result Date: 09/25/2019 CLINICAL DATA:  Lung cancer.  New right upper lobe nodule. EXAM: CT CHEST WITH CONTRAST TECHNIQUE: Multidetector CT imaging of the chest was performed during intravenous contrast administration. CONTRAST:  15m OMNIPAQUE IOHEXOL 300 MG/ML  SOLN COMPARISON:  PET 06/12/2019 and CT chest 05/29/2019,, 11/23/2018 and 07/17/2017. FINDINGS: Cardiovascular: Atherosclerotic calcification of the aorta and coronary arteries. Pulmonic trunk is enlarged. Heart size within normal limits. No pericardial effusion. Mediastinum/Nodes: Low-attenuation right thyroid nodule measures 2.3 cm, as before. No pathologically enlarged mediastinal, hilar or axillary lymph nodes. Esophagus is grossly unremarkable. Lungs/Pleura: Centrilobular and paraseptal emphysema. Irregular nodule in the posterior segment right upper lobe has enlarged, now measuring 10 x 15 mm (7/38), previously 8 x 12 mm on 05/29/2019 and 7 x 9 mm on 11/23/2018. Postoperative volume loss in the medial left  upper lobe. 6 mm left lower lobe nodule (7/26), stable from 07/17/2017 and considered benign. No pleural fluid. Debris is seen at the orifice of the left mainstem bronchus Upper Abdomen: Blushes of hyper attenuation in the right hepatic lobe may represent perfusion anomalies or flash fill hemangiomas. Visualized portions of the liver, gallbladder and right adrenal gland are unremarkable. Slight thickening of the left adrenal gland. Visualized portions of the kidneys, spleen, pancreas, stomach and bowel are unremarkable. Duodenal diverticulum is incidentally noted. No upper abdominal adenopathy. Musculoskeletal: Degenerative changes in the spine. No worrisome lytic or sclerotic lesions. IMPRESSION: 1. Enlarging irregular posterior segment right upper lobe nodule, highly worrisome for bronchogenic carcinoma. 2. Right thyroid nodule. Recommend thyroid [place](ref: J Am Coll Radiol. 2015 Feb;12(2): 143-50). 3. Aortic atherosclerosis (ICD10-I70.0). Coronary artery calcification. 4. Pulmonic trunk is enlarged, indicative of pulmonary arterial hypertension. 5.  Emphysema (ICD10-J43.9). Electronically Signed   By: MLorin PicketM.D.   On: 09/25/2019 14:18       IMPRESSION/PLAN: 1. History of Stage IB, pT2N0M0, NSCLC, SCC of the LUL, with a progressive RUL nodule suspicious for Stage IA2, cT1bN0M0 NSCLC. Dr. MLisbeth Renshawdiscusses the patient's previous history and the progressive findings in the RUL nodule. It is suspicious by growth interval for another synchronous NSCLC. Dr. MLisbeth Renshawreviews that surgery is still considered the gold standard of treatment, but that in patients who do not proceed with surgery, that stereotactic body radiotherapy (SBRT) is an acceptable alternative. We reviewed the most standard approach would be to proceed with a biopsy to confirm the diagnosis, followed by 3-5 fractions of SBRT. We discussed the risks, benefits, short, and long term effects of radiotherapy, and the patient will follow up next  week to make final decisions about treatment. We will follow along as he decides.   Given current concerns for patient exposure during the COVID-19 pandemic, this encounter was conducted via telephone.  The patient has provided two factor identification and has given verbal consent for this type of encounter and has been advised to only accept a meeting of this type in a secure network environment. The time spent during this encounter was 45 minutes including preparation, discussion, and coordination of the patient's care. The attendants for this meeting include LBlenda Nicely RN, Dr. MLisbeth Renshaw AHayden Pedro and JKaren Chafe  Justice.  During the encounter,  Blenda Nicely, RN, Dr. Lisbeth Renshaw, and Hayden Pedro were located at [facility] Radiation Oncology Department.  [name] was located at home.   The above documentation reflects my direct findings during this shared patient visit. Please see the separate note by Dr. Lisbeth Renshaw on this date for the remainder of the patient's plan of care.    Carola Rhine, PAC

## 2019-10-08 NOTE — Progress Notes (Signed)
[place] Psychosocial Distress Screening Clinical Social Work  Clinical Social Work was referred by distress screening protocol.  The patient scored a 6 on the Psychosocial Distress Thermometer which indicates moderate distress. Clinical Social Worker contacted patient at home to assess for distress and other psychosocial needs.  Patient expressed some anxiety associated with the unknown of his treatment plan.  Patient stated he was meeting with the surgeon next week and planned to make a treatment plan decision.  CSW and patient discussed common and appropriate feelings and emotions when being diagnosed with cancer, and the importance of support.  Patient and patients wife have been through treatment previously and are familiar with [facility] support services.  CSW briefly reviewed resources and encouraged patient and family to call with questions or concerns.        ONCBCN DISTRESS SCREENING 10/08/2019  Screening Type Initial Screening  Distress experienced in past week (1-10) 6  Family Problem type   Emotional problem type Nervousness/Anxiety;Adjusting to illness  Information Concerns Type Lack of info about diagnosis;Lack of info about treatment  Other Contact via phone     Johnnye Lana, MSW, LCSW, OSW-C Clinical Social Worker [facility] [phone]

## 2019-10-15 ENCOUNTER — Other Ambulatory Visit: Payer: Self-pay

## 2019-10-15 ENCOUNTER — Other Ambulatory Visit: Payer: Self-pay | Admitting: *Deleted

## 2019-10-15 ENCOUNTER — Encounter: Payer: Self-pay | Admitting: Internal Medicine

## 2019-10-15 ENCOUNTER — Encounter: Payer: Self-pay | Admitting: Thoracic Surgery (Cardiothoracic Vascular Surgery)

## 2019-10-15 ENCOUNTER — Ambulatory Visit (INDEPENDENT_AMBULATORY_CARE_PROVIDER_SITE_OTHER): Payer: Medicare Other | Admitting: Thoracic Surgery (Cardiothoracic Vascular Surgery)

## 2019-10-15 VITALS — BP 160/80 | HR 95 | Resp 20 | Ht 66.0 in | Wt 194.0 lb

## 2019-10-15 DIAGNOSIS — C3432 Malignant neoplasm of lower lobe, left bronchus or lung: Secondary | ICD-10-CM

## 2019-10-15 DIAGNOSIS — R911 Solitary pulmonary nodule: Secondary | ICD-10-CM

## 2019-10-15 DIAGNOSIS — D381 Neoplasm of uncertain behavior of trachea, bronchus and lung: Secondary | ICD-10-CM

## 2019-10-15 NOTE — Progress Notes (Signed)
[address]       [place] [address]             [phone]       HPI: [name] returns to discuss management of his right upper lobe lung nodule  [name] is an 79 year old man with a past history of tobacco abuse, COPD, lingular sparing left upper lobectomy for stage Ib squamous cell carcinoma in 2018, obesity, sleep apnea, arthritis, anxiety, and depression.  He had a history of heavy tobacco abuse prior to quitting smoking in 2018 just prior to his lung resection.  His FEV1 of the time was 0.89 which improved 1.01 with bronchodilators.  He did well with surgery and went home on day 5.  He has been followed with CT scans since his resection and was found to have a new nodule in the posterior right upper lobe.  On follow-up that nodule has slowly increased in size.  On PET in October it had an SUV of 2.85.  I recommended we intervene at that time but he did not want to do so and wanted to wait for a follow-up scan in February.  On that scan the nodule had increased in size further.  I saw him on 10/02/2019 and offered him the option of a wedge resection versus stereotactic radiation.  We discussed the relative advantages and disadvantages.  He was leaning toward surgery but wanted to talk to radiation oncology before making a final decision.  He saw [name] and also spoke to Dr. Lisbeth Butler.  He says he has been feeling very congested.  He was started on antibiotics by Dr. Dagmar Butler.  He has noted some improvement but is not completely resolved.  Past Medical History:  Diagnosis Date  . Anxiety    pt. reports that he " could swing from the ......" because he is so anxious about the surgery   . Arthritis    R hand tendonitis   . COPD (chronic obstructive pulmonary disease) ([place])   . Depression   . Hyperlipidemia   . lung ca dx'd 10/2016  . Sleep apnea    cannot tolerate cpap    Current Outpatient Medications  Medication Sig Dispense Refill  . acetaminophen  (TYLENOL) 325 MG tablet Take 2 tablets (650 mg total) by mouth every 6 (six) hours as needed for mild pain (or Fever >/= 101).    Marland Kitchen albuterol (PROVENTIL) (2.5 MG/3ML) 0.083% nebulizer solution Take 3 mLs (2.5 mg total) by nebulization every 2 (two) hours as needed for wheezing. 75 mL 3  . budesonide-formoterol (SYMBICORT) 160-4.5 MCG/ACT inhaler Inhale 2 puffs into the lungs 2 (two) times daily.    . fexofenadine (ALLEGRA) 180 MG tablet Take 180 mg by mouth daily as needed for allergies or rhinitis.    Marland Kitchen sertraline (ZOLOFT) 100 MG tablet Take 100 mg by mouth daily.    . simvastatin (ZOCOR) 20 MG tablet Take 20 mg by mouth every evening.    . traZODone (DESYREL) 50 MG tablet Take 50 mg by mouth at bedtime.   1  . levofloxacin (LEVAQUIN) 500 MG tablet Take 500 mg by mouth daily.     No current facility-administered medications for this visit.    Physical Exam BP (!) 160/80   Pulse 95   Resp 20   Ht 5\' 6"  (1.676 m)   Wt 194 lb (88 kg)   SpO2 91% Comment: RA  BMI 31.55 kg/m  79 year old man in no  acute distress HEENT unremarkable Alert and oriented x3 with no focal deficits Regular rate and rhythm with no murmur No respiratory distress, breath sounds diminished but otherwise clear See note from 10/02/2019 for other details  Diagnostic Tests: CT CHEST WITH CONTRAST  TECHNIQUE: Multidetector CT imaging of the chest was performed during intravenous contrast administration.  CONTRAST:  29mL OMNIPAQUE IOHEXOL 300 MG/ML  SOLN  COMPARISON:  PET 06/12/2019 and CT chest 05/29/2019,, 11/23/2018 and 07/17/2017.  FINDINGS: Cardiovascular: Atherosclerotic calcification of the aorta and coronary arteries. Pulmonic trunk is enlarged. Heart size within normal limits. No pericardial effusion.  Mediastinum/Nodes: Low-attenuation right thyroid nodule measures 2.3 cm, as before. No pathologically enlarged mediastinal, hilar or axillary lymph nodes. Esophagus is grossly  unremarkable.  Lungs/Pleura: Centrilobular and paraseptal emphysema. Irregular nodule in the posterior segment right upper lobe has enlarged, now measuring 10 x 15 mm (7/38), previously 8 x 12 mm on 05/29/2019 and 7 x 9 mm on 11/23/2018. Postoperative volume loss in the medial left upper lobe. 6 mm left lower lobe nodule (7/26), stable from 07/17/2017 and considered benign. No pleural fluid. Debris is seen at the orifice of the left mainstem bronchus  Upper Abdomen: Blushes of hyper attenuation in the right hepatic lobe may represent perfusion anomalies or flash fill hemangiomas. Visualized portions of the liver, gallbladder and right adrenal gland are unremarkable. Slight thickening of the left adrenal gland. Visualized portions of the kidneys, spleen, pancreas, stomach and bowel are unremarkable. Duodenal diverticulum is incidentally noted. No upper abdominal adenopathy.  Musculoskeletal: Degenerative changes in the spine. No worrisome lytic or sclerotic lesions.  IMPRESSION: 1. Enlarging irregular posterior segment right upper lobe nodule, highly worrisome for bronchogenic carcinoma. 2. Right thyroid nodule. Recommend thyroid US (ref: J Am Coll Radiol. 2015 Feb;12(2): 143-50). 3. Aortic atherosclerosis (ICD10-I70.0). Coronary artery calcification. 4. Pulmonic trunk is enlarged, indicative of pulmonary arterial hypertension. 5.  Emphysema (ICD10-J43.9).   Electronically Signed   By: Lorin Picket M.D.   On: 09/25/2019 14:18 I personally reviewed the CT images and concur with the findings noted above  Impression: [name] is an 79 year old man with a past history of tobacco abuse, COPD, lingular sparing left upper lobectomy for stage Ib squamous cell carcinoma in 2018, obesity, sleep apnea, arthritis, anxiety, and depression.  He now has an enlarging spiculated nodule in the posterior aspect of his right upper lobe that is hypermetabolic on PET/CT.  Differential  diagnosis includes primary bronchogenic carcinoma, metastatic disease from his prior lung cancer, infectious and inflammatory nodules.  The appearance is most consistent with a new primary bronchogenic carcinoma, and the nodule has to be considered that unless it can be proven otherwise.  I again discussed with him the treatment options of surgery versus stereotactic radiation.  He is very concerned about the possibility of a rib fracture with stereotactic radiation.  I did emphasize to him that it is possible to have surgical complications including chronic pain as well.  We again discussed the proposed procedure which would be a navigational bronchoscopy to mark the nodule followed by a wedge resection and lymph node dissection.  He is aware of the general nature of the procedure having had a similar procedure on the other side.  Again informed him of the indications, risks, benefits, and alternatives.  He understands the risks include, but are not limited to death, MI, DVT, PE, bleeding, possible need for transfusion, infection, prolonged air leak, cardiac arrhythmias, as well as possibility of other unforeseeable complications.  He wishes to  proceed with surgical resection.  We will obtain pulmonary function testing.  Plan: Navigational bronchoscopy for marking and robotic right upper lobe wedge resection on Monday, 10/28/2019.  [name], MD [facility] [phone]

## 2019-10-15 NOTE — H&P (View-Only) (Signed)
[address]       [place] [address]             [phone]       HPI: [name] returns to discuss management of his right upper lobe lung nodule  [name] is an 79 year old man with a past history of tobacco abuse, COPD, lingular sparing left upper lobectomy for stage Ib squamous cell carcinoma in 2018, obesity, sleep apnea, arthritis, anxiety, and depression.  He had a history of heavy tobacco abuse prior to quitting smoking in 2018 just prior to his lung resection.  His FEV1 of the time was 0.89 which improved 1.01 with bronchodilators.  He did well with surgery and went home on day 5.  He has been followed with CT scans since his resection and was found to have a new nodule in the posterior right upper lobe.  On follow-up that nodule has slowly increased in size.  On PET in October it had an SUV of 2.85.  I recommended we intervene at that time but he did not want to do so and wanted to wait for a follow-up scan in February.  On that scan the nodule had increased in size further.  I saw him on 10/02/2019 and offered him the option of a wedge resection versus stereotactic radiation.  We discussed the relative advantages and disadvantages.  He was leaning toward surgery but wanted to talk to radiation oncology before making a final decision.  He saw [name] and also spoke to Dr. Lisbeth Butler.  He says he has been feeling very congested.  He was started on antibiotics by Dr. Dagmar Butler.  He has noted some improvement but is not completely resolved.  Past Medical History:  Diagnosis Date  . Anxiety    pt. reports that he " could swing from the ......" because he is so anxious about the surgery   . Arthritis    R hand tendonitis   . COPD (chronic obstructive pulmonary disease) ([place])   . Depression   . Hyperlipidemia   . lung ca dx'd 10/2016  . Sleep apnea    cannot tolerate cpap    Current Outpatient Medications  Medication Sig Dispense Refill  . acetaminophen  (TYLENOL) 325 MG tablet Take 2 tablets (650 mg total) by mouth every 6 (six) hours as needed for mild pain (or Fever >/= 101).    Marland Kitchen albuterol (PROVENTIL) (2.5 MG/3ML) 0.083% nebulizer solution Take 3 mLs (2.5 mg total) by nebulization every 2 (two) hours as needed for wheezing. 75 mL 3  . budesonide-formoterol (SYMBICORT) 160-4.5 MCG/ACT inhaler Inhale 2 puffs into the lungs 2 (two) times daily.    . fexofenadine (ALLEGRA) 180 MG tablet Take 180 mg by mouth daily as needed for allergies or rhinitis.    Marland Kitchen sertraline (ZOLOFT) 100 MG tablet Take 100 mg by mouth daily.    . simvastatin (ZOCOR) 20 MG tablet Take 20 mg by mouth every evening.    . traZODone (DESYREL) 50 MG tablet Take 50 mg by mouth at bedtime.   1  . levofloxacin (LEVAQUIN) 500 MG tablet Take 500 mg by mouth daily.     No current facility-administered medications for this visit.    Physical Exam BP (!) 160/80   Pulse 95   Resp 20   Ht 5\' 6"  (1.676 m)   Wt 194 lb (88 kg)   SpO2 91% Comment: RA  BMI 31.2 kg/m  79 year old man in no  acute distress HEENT unremarkable Alert and oriented x3 with no focal deficits Regular rate and rhythm with no murmur No respiratory distress, breath sounds diminished but otherwise clear See note from 10/02/2019 for other details  Diagnostic Tests: CT CHEST WITH CONTRAST  TECHNIQUE: Multidetector CT imaging of the chest was performed during intravenous contrast administration.  CONTRAST:  64mL OMNIPAQUE IOHEXOL 300 MG/ML  SOLN  COMPARISON:  PET 06/12/2019 and CT chest 05/29/2019,, 11/23/2018 and 07/17/2017.  FINDINGS: Cardiovascular: Atherosclerotic calcification of the aorta and coronary arteries. Pulmonic trunk is enlarged. Heart size within normal limits. No pericardial effusion.  Mediastinum/Nodes: Low-attenuation right thyroid nodule measures 2.3 cm, as before. No pathologically enlarged mediastinal, hilar or axillary lymph nodes. Esophagus is grossly unremarkable.   Lungs/Pleura: Centrilobular and paraseptal emphysema. Irregular nodule in the posterior segment right upper lobe has enlarged, now measuring 10 x 15 mm (7/38), previously 8 x 12 mm on 05/29/2019 and 7 x 9 mm on 11/23/2018. Postoperative volume loss in the medial left upper lobe. 6 mm left lower lobe nodule (7/26), stable from 07/17/2017 and considered benign. No pleural fluid. Debris is seen at the orifice of the left mainstem bronchus  Upper Abdomen: Blushes of hyper attenuation in the right hepatic lobe may represent perfusion anomalies or flash fill hemangiomas. Visualized portions of the liver, gallbladder and right adrenal gland are unremarkable. Slight thickening of the left adrenal gland. Visualized portions of the kidneys, spleen, pancreas, stomach and bowel are unremarkable. Duodenal diverticulum is incidentally noted. No upper abdominal adenopathy.  Musculoskeletal: Degenerative changes in the spine. No worrisome lytic or sclerotic lesions.  IMPRESSION: 1. Enlarging irregular posterior segment right upper lobe nodule, highly worrisome for bronchogenic carcinoma. 2. Right thyroid nodule. Recommend thyroid US (ref: J Am Coll Radiol. 2015 Feb;12(2): 143-50). 3. Aortic atherosclerosis (ICD10-I70.0). Coronary artery calcification. 4. Pulmonic trunk is enlarged, indicative of pulmonary arterial hypertension. 5.  Emphysema (ICD10-J43.9).   Electronically Signed   By: [name] M.D.   On: 09/25/2019 14:18 I personally reviewed the CT images and concur with the findings noted above  Impression: [name] is an 79 year old man with a past history of tobacco abuse, COPD, lingular sparing left upper lobectomy for stage Ib squamous cell carcinoma in 2018, obesity, sleep apnea, arthritis, anxiety, and depression.  He now has an enlarging spiculated nodule in the posterior aspect of his right upper lobe that is hypermetabolic on PET/CT.  Differential diagnosis includes  primary bronchogenic carcinoma, metastatic disease from his prior lung cancer, infectious and inflammatory nodules.  The appearance is most consistent with a new primary bronchogenic carcinoma, and the nodule has to be considered that unless it can be proven otherwise.  I again discussed with him the treatment options of surgery versus stereotactic radiation.  He is very concerned about the possibility of a rib fracture with stereotactic radiation.  I did emphasize to him that it is possible to have surgical complications including chronic pain as well.  We again discussed the proposed procedure which would be a navigational bronchoscopy to mark the nodule followed by a wedge resection and lymph node dissection.  He is aware of the general nature of the procedure having had a similar procedure on the other side.  Again informed him of the indications, risks, benefits, and alternatives.  He understands the risks include, but are not limited to death, MI, DVT, PE, bleeding, possible need for transfusion, infection, prolonged air leak, cardiac arrhythmias, as well as possibility of other unforeseeable complications.  He wishes to  proceed with surgical resection.  We will obtain pulmonary function testing.  Plan: Navigational bronchoscopy for marking and robotic right upper lobe wedge resection on Monday, 10/28/2019.  [name], MD [facility] [phone]

## 2019-10-16 ENCOUNTER — Encounter: Payer: Self-pay | Admitting: *Deleted

## 2019-10-16 NOTE — Progress Notes (Unsigned)
f °

## 2019-10-21 ENCOUNTER — Other Ambulatory Visit (HOSPITAL_COMMUNITY)
Admission: RE | Admit: 2019-10-21 | Discharge: 2019-10-21 | Disposition: A | Discharge status: Home / Self Care | Payer: Medicare Other | Source: Ambulatory Visit | Attending: Thoracic Surgery (Cardiothoracic Vascular Surgery) | Admitting: Thoracic Surgery (Cardiothoracic Vascular Surgery)

## 2019-10-21 DIAGNOSIS — Z01812 Encounter for preprocedural laboratory examination: Secondary | ICD-10-CM | POA: Insufficient documentation

## 2019-10-21 DIAGNOSIS — Z20822 Contact with and (suspected) exposure to covid-19: Secondary | ICD-10-CM | POA: Diagnosis not present

## 2019-10-21 DIAGNOSIS — R911 Solitary pulmonary nodule: Secondary | ICD-10-CM

## 2019-10-21 LAB — SARS CORONAVIRUS 2 (TAT 6-24 HRS): SARS Coronavirus 2: NEGATIVE

## 2019-10-23 NOTE — Progress Notes (Signed)
[facility] [place], [place] - [address] [address] Phone: [phone] Fax: [phone]  [facility] [place], [place] [address] Phone: [phone] Fax: [phone]    Your procedure is scheduled on Monday, March 15th.  Report to [facility] Main Entrance "A" at 5:30 A.M., and check in at the Admitting office.  Call this number if you have problems the morning of surgery:  [phone]  Call [phone] if you have any questions prior to your surgery date Monday-Friday 8am-4pm   Remember:  Do not eat or drink after midnight the night before your surgery    Take these medicines the morning of surgery with A SIP OF WATER  budesonide-formoterol (SYMBICORT)  sertraline (ZOLOFT)   If needed - acetaminophen (TYLENOL), albuterol (PROVENTIL), fexofenadine (ALLEGRA),   As of today, STOP taking any Aspirin (unless otherwise instructed by your surgeon), Aleve, Naproxen, Ibuprofen, Motrin, Advil, Goody's, BC's, all herbal medications, fish oil, and all vitamins.  The Morning of Surgery  Do not wear jewelry.  Do not wear lotions, powders, colognes, deodorant  Men may shave face and neck.  Do not bring valuables to the hospital.  [facility] is not responsible for any belongings or valuables.  If you are a smoker, DO NOT Smoke 24 hours prior to surgery  If you wear a CPAP at night please bring your mask the morning of surgery   Remember that you must have someone to transport you home after your surgery, and remain with you for 24 hours if you are discharged the same day.  Please bring cases for contacts, glasses, hearing aids, dentures or bridgework because it cannot be worn into surgery.   Leave your suitcase in the car.  After surgery it may be brought to your room.  For patients admitted  to the hospital, discharge time will be determined by your treatment team.  Patients discharged the day of surgery will not be allowed to drive home.   Special instructions:   [facility]- Preparing For Surgery  Before surgery, you can play an important role. Because skin is not sterile, your skin needs to be as free of germs as possible. You can reduce the number of germs on your skin by washing with CHG (chlorahexidine gluconate) Soap before surgery.  CHG is an antiseptic cleaner which kills germs and bonds with the skin to continue killing germs even after washing.    Oral Hygiene is also important to reduce your risk of infection.  Remember - BRUSH YOUR TEETH THE MORNING OF SURGERY WITH YOUR REGULAR TOOTHPASTE  Please do not use if you have an allergy to CHG or antibacterial soaps. If your skin becomes reddened/irritated stop using the CHG.  Do not shave (including legs and underarms) for at least 48 hours prior to first CHG shower. It is OK to shave your face.  Please follow these instructions carefully.   1. Shower the NIGHT BEFORE SURGERY and the MORNING OF SURGERY with CHG Soap.   2. If you chose to wash your hair, wash your hair first as usual with your normal shampoo.  3. After you shampoo, rinse your hair and body thoroughly to remove the shampoo.  4. Use CHG as you would any other liquid soap. You can apply CHG directly to the skin and wash gently with a scrungie  or a clean washcloth.   5. Apply the CHG Soap to your body ONLY FROM THE NECK DOWN.  Do not use on open wounds or open sores. Avoid contact with your eyes, ears, mouth and genitals (private parts). Wash Face and genitals (private parts)  with your normal soap.   6. Wash thoroughly, paying special attention to the area where your surgery will be performed.  7. Thoroughly rinse your body with warm water from the neck down.  8. DO NOT shower/wash with your normal soap after using and rinsing off the CHG Soap.  9. Pat  yourself dry with a CLEAN TOWEL.  10. Wear CLEAN PAJAMAS to bed the night before surgery, wear comfortable clothes the morning of surgery  11. Place CLEAN SHEETS on your bed the night of your first shower and DO NOT SLEEP WITH PETS.  Day of Surgery: Please shower the morning of surgery with the CHG soap Do not apply any deodorants/lotions. Please wear clean clothes to the hospital/surgery center.   Remember to brush your teeth WITH YOUR REGULAR TOOTHPASTE.  Please read over the following fact sheets that you were given.

## 2019-10-24 ENCOUNTER — Ambulatory Visit (HOSPITAL_COMMUNITY)
Admission: RE | Admit: 2019-10-24 | Discharge: 2019-10-24 | Disposition: A | Discharge status: Home / Self Care | Payer: Medicare Other | Source: Ambulatory Visit | Attending: Thoracic Surgery (Cardiothoracic Vascular Surgery) | Admitting: Thoracic Surgery (Cardiothoracic Vascular Surgery)

## 2019-10-24 ENCOUNTER — Encounter (HOSPITAL_COMMUNITY)
Admission: RE | Admit: 2019-10-24 | Discharge: 2019-10-24 | Disposition: A | Discharge status: Home / Self Care | Payer: Medicare Other | Source: Ambulatory Visit | Attending: Thoracic Surgery (Cardiothoracic Vascular Surgery) | Admitting: Thoracic Surgery (Cardiothoracic Vascular Surgery)

## 2019-10-24 ENCOUNTER — Encounter (HOSPITAL_COMMUNITY): Payer: Self-pay

## 2019-10-24 ENCOUNTER — Other Ambulatory Visit: Payer: Self-pay

## 2019-10-24 ENCOUNTER — Other Ambulatory Visit (HOSPITAL_COMMUNITY)
Admission: RE | Admit: 2019-10-24 | Discharge: 2019-10-24 | Disposition: A | Discharge status: Home / Self Care | Payer: Medicare Other | Source: Ambulatory Visit | Attending: Thoracic Surgery (Cardiothoracic Vascular Surgery) | Admitting: Thoracic Surgery (Cardiothoracic Vascular Surgery)

## 2019-10-24 DIAGNOSIS — Z20822 Contact with and (suspected) exposure to covid-19: Secondary | ICD-10-CM | POA: Diagnosis not present

## 2019-10-24 DIAGNOSIS — Z01818 Encounter for other preprocedural examination: Secondary | ICD-10-CM | POA: Diagnosis not present

## 2019-10-24 DIAGNOSIS — R911 Solitary pulmonary nodule: Secondary | ICD-10-CM

## 2019-10-24 HISTORY — DX: Pneumonia, unspecified organism: J18.9

## 2019-10-24 HISTORY — DX: Personal history of urinary calculi: Z87.442

## 2019-10-24 HISTORY — DX: Dyspnea, unspecified: R06.00

## 2019-10-24 LAB — PULMONARY FUNCTION TEST
DL/VA % pred: 76 %
DL/VA: 3.04 ml/min/mmHg/L
DLCO cor % pred: 52 %
DLCO cor: 11.28 ml/min/mmHg
DLCO unc % pred: 53 %
DLCO unc: 11.49 ml/min/mmHg
FEF 25-75 Post: 0.48 L/sec
FEF 25-75 Pre: 0.36 L/sec
FEF2575-%Change-Post: 35 %
FEF2575-%Pred-Post: 29 %
FEF2575-%Pred-Pre: 22 %
FEV1-%Change-Post: 5 %
FEV1-%Pred-Post: 51 %
FEV1-%Pred-Pre: 48 %
FEV1-Post: 1.21 L
FEV1-Pre: 1.15 L
FEV1FVC-%Change-Post: 0 %
FEV1FVC-%Pred-Pre: 69 %
FEV6-%Change-Post: 9 %
FEV6-%Pred-Post: 71 %
FEV6-%Pred-Pre: 65 %
FEV6-Post: 2.23 L
FEV6-Pre: 2.04 L
FEV6FVC-%Change-Post: 0 %
FEV6FVC-%Pred-Post: 97 %
FEV6FVC-%Pred-Pre: 97 %
FVC-%Change-Post: 6 %
FVC-%Pred-Post: 73 %
FVC-%Pred-Pre: 68 %
FVC-Post: 2.47 L
FVC-Pre: 2.33 L
Post FEV1/FVC ratio: 49 %
Post FEV6/FVC ratio: 90 %
Pre FEV1/FVC ratio: 49 %
Pre FEV6/FVC Ratio: 90 %
RV % pred: 123 %
RV: 3 L
TLC % pred: 86 %
TLC: 5.42 L

## 2019-10-24 LAB — CBC
HCT: 46.7 % (ref 39.0–52.0)
Hemoglobin: 15.3 g/dL (ref 13.0–17.0)
MCH: 31.5 pg (ref 26.0–34.0)
MCHC: 32.8 g/dL (ref 30.0–36.0)
MCV: 96.3 fL (ref 80.0–100.0)
Platelets: 270 10*3/uL (ref 150–400)
RBC: 4.85 MIL/uL (ref 4.22–5.81)
RDW: 13.6 % (ref 11.5–15.5)
WBC: 9.9 10*3/uL (ref 4.0–10.5)
nRBC: 0 % (ref 0.0–0.2)

## 2019-10-24 LAB — URINALYSIS, ROUTINE W REFLEX MICROSCOPIC
Bilirubin Urine: NEGATIVE
Glucose, UA: 50 mg/dL — AB
Hgb urine dipstick: NEGATIVE
Ketones, ur: NEGATIVE mg/dL
Nitrite: NEGATIVE
Protein, ur: NEGATIVE mg/dL
Specific Gravity, Urine: 1.02 (ref 1.005–1.030)
pH: 6 (ref 5.0–8.0)

## 2019-10-24 LAB — BLOOD GAS, ARTERIAL
Acid-Base Excess: 3.8 mmol/L — ABNORMAL HIGH (ref 0.0–2.0)
Bicarbonate: 28.5 mmol/L — ABNORMAL HIGH (ref 20.0–28.0)
Drawn by: 421801
FIO2: 21
O2 Saturation: 96.3 %
Patient temperature: 37
pCO2 arterial: 48.7 mmHg — ABNORMAL HIGH (ref 32.0–48.0)
pH, Arterial: 7.386 (ref 7.350–7.450)
pO2, Arterial: 84.6 mmHg (ref 83.0–108.0)

## 2019-10-24 LAB — TYPE AND SCREEN
ABO/RH(D): A NEG
Antibody Screen: NEGATIVE

## 2019-10-24 LAB — COMPREHENSIVE METABOLIC PANEL
ALT: 15 U/L (ref 0–44)
AST: 15 U/L (ref 15–41)
Albumin: 3.5 g/dL (ref 3.5–5.0)
Alkaline Phosphatase: 64 U/L (ref 38–126)
Anion gap: 9 (ref 5–15)
BUN: 15 mg/dL (ref 8–23)
CO2: 25 mmol/L (ref 22–32)
Calcium: 9 mg/dL (ref 8.9–10.3)
Chloride: 106 mmol/L (ref 98–111)
Creatinine, Ser: 0.85 mg/dL (ref 0.61–1.24)
GFR calc Af Amer: >60 mL/min (ref >60)
GFR calc non Af Amer: >60 mL/min (ref >60)
Glucose, Bld: 149 mg/dL — ABNORMAL HIGH (ref 70–99)
Potassium: 3.9 mmol/L (ref 3.5–5.1)
Sodium: 140 mmol/L (ref 135–145)
Total Bilirubin: 0.5 mg/dL (ref 0.3–1.2)
Total Protein: 6.6 g/dL (ref 6.5–8.1)

## 2019-10-24 LAB — SURGICAL PCR SCREEN
MRSA, PCR: NEGATIVE
Staphylococcus aureus: NEGATIVE

## 2019-10-24 LAB — PROTIME-INR
INR: 1.1 (ref 0.8–1.2)
Prothrombin Time: 13.8 seconds (ref 11.4–15.2)

## 2019-10-24 LAB — APTT: aPTT: 31 seconds (ref 24–36)

## 2019-10-24 LAB — SARS CORONAVIRUS 2 (TAT 6-24 HRS): SARS Coronavirus 2: NEGATIVE

## 2019-10-24 MED ORDER — ALBUTEROL SULFATE (2.5 MG/3ML) 0.083% IN NEBU
2.5000 mg | INHALATION_SOLUTION | Freq: Once | RESPIRATORY_TRACT | Status: AC
  Administered 2019-10-24: 2.5 mg via RESPIRATORY_TRACT

## 2019-10-24 NOTE — Progress Notes (Addendum)
PCP - [name], MD Cardiologist - Denies Urology- Irine Seal, MD Oncology- Curt Bears, MD  PPM/ICD - Denies  Chest x-ray - DOS EKG - 10/24/19 Stress Test - Per patient, ~ 35 years ago by Dr. Ludwig Lean. Doreatha Lew, MD. Results were negative, per patient, test was done because of paternal hx of heart disease. No follow- ups done afterwards. ECHO - Per patient, ~ 35 years ago by Dr. Ludwig Lean. Doreatha Lew, MD. Results were negative, per patient, test was done because of paternal hx of heart disease. No follow- ups done afterwards. Cardiac Cath - Denies  Sleep Study - Yes CPAP - Per patient, cannot tolerate, but wears 3L O2 HS  Patient denies being a diabetic.  Blood Thinner Instructions: N/A Aspirin Instructions: N/A  ERAS Protcol - N/A PRE-SURGERY Ensure or G2- N/A  COVID TEST- 10/24/19   Anesthesia review: Yes, pulm. Hx.  Patient denies shortness of breath, fever, cough and chest pain at PAT appointment   All instructions explained to the patient, with a verbal understanding of the material. Patient agrees to go over the instructions while at home for a better understanding. Patient also instructed to self quarantine after being tested for COVID-19. The opportunity to ask questions was provided.

## 2019-10-24 NOTE — Progress Notes (Signed)
Wyona Almas, Nurse with Dr. Leonarda Salon office to notify about abnormal U/A.

## 2019-10-25 ENCOUNTER — Telehealth: Payer: Self-pay

## 2019-10-25 NOTE — Anesthesia Preprocedure Evaluation (Addendum)
Anesthesia Evaluation  Patient identified by MRN, date of birth, ID band Patient awake    Reviewed: Allergy & Precautions, H&P , NPO status , Patient's Chart, lab work & pertinent test results  Airway Mallampati: II  TM Distance: >3 FB Neck ROM: Full    Dental no notable dental hx. (+) Teeth Intact, Dental Advisory Given   Pulmonary sleep apnea and Oxygen sleep apnea , COPD,  COPD inhaler, Patient abstained from smoking., former smoker,    Pulmonary exam normal breath sounds clear to auscultation       Cardiovascular Exercise Tolerance: Good hypertension, + Peripheral Vascular Disease   Rhythm:Regular Rate:Normal     Neuro/Psych Anxiety Depression negative neurological ROS     GI/Hepatic negative GI ROS, Neg liver ROS,   Endo/Other  negative endocrine ROS  Renal/GU negative Renal ROS  negative genitourinary   Musculoskeletal  (+) Arthritis , Osteoarthritis,    Abdominal   Peds  Hematology negative hematology ROS (+)   Anesthesia Other Findings   Reproductive/Obstetrics negative OB ROS                           Anesthesia Physical Anesthesia Plan  ASA: III  Anesthesia Plan: General   Post-op Pain Management:    Induction: Intravenous  PONV Risk Score and Plan: 3 and Ondansetron, Dexamethasone and Midazolam  Airway Management Planned: Double Lumen EBT  Additional Equipment: Arterial line, CVP and Ultrasound Guidance Line Placement  Intra-op Plan:   Post-operative Plan: Extubation in OR  Informed Consent: I have reviewed the patients History and Physical, chart, labs and discussed the procedure including the risks, benefits and alternatives for the proposed anesthesia with the patient or authorized representative who has indicated his/her understanding and acceptance.     Dental advisory given  Plan Discussed with: CRNA  Anesthesia Plan Comments: ( Hx of COPD, Lung CA s/p  lingular sparing left upper lobectomy for stage Ib squamous cell carcinoma in 2018. He did well with surgery and went home on day 5.He has been followed with CT scans since his resection and was found to have a new nodule in the posterior right upper lobe.    Preop labs reviewed, unremarkable.   EKG 10/24/19: Normal sinus rhythm. Rate 84. Right bundle branch block  Chest CT 09/25/19: IMPRESSION: 1. Enlarging irregular posterior segment right upper lobe nodule, highly worrisome for bronchogenic carcinoma. 2. Right thyroid nodule. Recommend thyroid US (ref: J Am Coll Radiol. 2015 Feb;12(2): 143-50). 3. Aortic atherosclerosis (ICD10-I70.0). Coronary artery calcification. 4. Pulmonic trunk is enlarged, indicative of pulmonary arterial hypertension. 5. Emphysema (ICD10-J43.9).  PFT 10/24/19: FVC-%Pred-Pre Latest Units: % 68 FEV1-%Pred-Pre Latest Units: % 48 FEV1FVC-%Pred-Pre Latest Units: % 69 TLC % pred Latest Units: % 86 DLCO cor % pred Latest Units: % 52 )       Anesthesia Quick Evaluation

## 2019-10-25 NOTE — Progress Notes (Signed)
Anesthesia Chart Review:  Hx of COPD, Lung CA s/p lingular sparing left upper lobectomy for stage Ib squamous cell carcinoma in 2018. He did well with surgery and went home on day 5.He has been followed with CT scans since his resection and was found to have a new nodule in the posterior right upper lobe.    Preop labs reviewed, unremarkable.   EKG 10/24/19: Normal sinus rhythm. Rate 84. Right bundle branch block  Chest CT 09/25/19: IMPRESSION: 1. Enlarging irregular posterior segment right upper lobe nodule, highly worrisome for bronchogenic carcinoma. 2. Right thyroid nodule. Recommend thyroid US (ref: J Am Coll Radiol. 2015 Feb;12(2): 143-50). 3. Aortic atherosclerosis (ICD10-I70.0). Coronary artery calcification. 4. Pulmonic trunk is enlarged, indicative of pulmonary arterial hypertension. 5. Emphysema (ICD10-J43.9).  PFT 10/24/19: FVC-%Pred-Pre Latest Units: % 68  FEV1-%Pred-Pre Latest Units: % 48  FEV1FVC-%Pred-Pre Latest Units: % 69  TLC % pred Latest Units: % 86  DLCO cor % pred Latest Units: % [place], PA-C [facility] Short Stay Center/Anesthesiology Phone [phone] 10/25/2019 10:20 AM

## 2019-10-25 NOTE — Telephone Encounter (Addendum)
Per Dr. Roxan Hockey, pt called this morning to assess his condition d/t UA results yesterday and upcoming surgery. Pt denies fever, fatigue, pain, nausea, and urinary frequency/dysuria. Instructed pt to call TCTS if he develops any of these symptoms prior to surgery. Dr. Roxan Hockey notified.

## 2019-10-28 ENCOUNTER — Encounter (HOSPITAL_COMMUNITY)
Admission: RE | Disposition: A | Discharge status: Home / Self Care | Payer: Self-pay | Source: Home / Self Care | Attending: Thoracic Surgery (Cardiothoracic Vascular Surgery)

## 2019-10-28 ENCOUNTER — Inpatient Hospital Stay (HOSPITAL_COMMUNITY)
Admission: RE | Admit: 2019-10-28 | Discharge: 2019-11-01 | DRG: 876 | Disposition: A | Discharge status: Home / Self Care | Payer: Medicare Other | Attending: Thoracic Surgery (Cardiothoracic Vascular Surgery) | Admitting: Thoracic Surgery (Cardiothoracic Vascular Surgery)

## 2019-10-28 ENCOUNTER — Encounter (HOSPITAL_COMMUNITY): Payer: Self-pay | Admitting: Thoracic Surgery (Cardiothoracic Vascular Surgery)

## 2019-10-28 ENCOUNTER — Inpatient Hospital Stay (HOSPITAL_COMMUNITY): Payer: Medicare Other | Admitting: Anesthesiology

## 2019-10-28 ENCOUNTER — Inpatient Hospital Stay (HOSPITAL_COMMUNITY): Payer: Medicare Other

## 2019-10-28 ENCOUNTER — Other Ambulatory Visit: Payer: Self-pay

## 2019-10-28 ENCOUNTER — Inpatient Hospital Stay (HOSPITAL_COMMUNITY): Payer: Medicare Other | Admitting: Physician Assistant

## 2019-10-28 DIAGNOSIS — F329 Major depressive disorder, single episode, unspecified: Principal | ICD-10-CM | POA: Diagnosis present

## 2019-10-28 DIAGNOSIS — Z79899 Other long term (current) drug therapy: Secondary | ICD-10-CM

## 2019-10-28 DIAGNOSIS — J449 Chronic obstructive pulmonary disease, unspecified: Secondary | ICD-10-CM | POA: Diagnosis not present

## 2019-10-28 DIAGNOSIS — R911 Solitary pulmonary nodule: Secondary | ICD-10-CM | POA: Diagnosis not present

## 2019-10-28 DIAGNOSIS — E669 Obesity, unspecified: Secondary | ICD-10-CM | POA: Diagnosis present

## 2019-10-28 DIAGNOSIS — M19041 Primary osteoarthritis, right hand: Secondary | ICD-10-CM | POA: Diagnosis present

## 2019-10-28 DIAGNOSIS — Z792 Long term (current) use of antibiotics: Secondary | ICD-10-CM | POA: Diagnosis not present

## 2019-10-28 DIAGNOSIS — Z4682 Encounter for fitting and adjustment of non-vascular catheter: Secondary | ICD-10-CM

## 2019-10-28 DIAGNOSIS — C3411 Malignant neoplasm of upper lobe, right bronchus or lung: Secondary | ICD-10-CM | POA: Diagnosis present

## 2019-10-28 DIAGNOSIS — Z87891 Personal history of nicotine dependence: Secondary | ICD-10-CM

## 2019-10-28 DIAGNOSIS — J9382 Other air leak: Secondary | ICD-10-CM | POA: Diagnosis not present

## 2019-10-28 DIAGNOSIS — E785 Hyperlipidemia, unspecified: Secondary | ICD-10-CM | POA: Diagnosis present

## 2019-10-28 DIAGNOSIS — I2721 Secondary pulmonary arterial hypertension: Secondary | ICD-10-CM | POA: Diagnosis present

## 2019-10-28 DIAGNOSIS — F419 Anxiety disorder, unspecified: Secondary | ICD-10-CM | POA: Diagnosis present

## 2019-10-28 DIAGNOSIS — I700 Atherosclerosis of aorta: Secondary | ICD-10-CM | POA: Diagnosis present

## 2019-10-28 DIAGNOSIS — E041 Nontoxic single thyroid nodule: Secondary | ICD-10-CM | POA: Diagnosis present

## 2019-10-28 DIAGNOSIS — M1990 Unspecified osteoarthritis, unspecified site: Secondary | ICD-10-CM | POA: Diagnosis present

## 2019-10-28 DIAGNOSIS — G4730 Sleep apnea, unspecified: Secondary | ICD-10-CM | POA: Diagnosis present

## 2019-10-28 DIAGNOSIS — J9811 Atelectasis: Secondary | ICD-10-CM | POA: Diagnosis not present

## 2019-10-28 DIAGNOSIS — Z7951 Long term (current) use of inhaled steroids: Secondary | ICD-10-CM

## 2019-10-28 DIAGNOSIS — Z419 Encounter for procedure for purposes other than remedying health state, unspecified: Secondary | ICD-10-CM

## 2019-10-28 DIAGNOSIS — Z902 Acquired absence of lung [part of]: Secondary | ICD-10-CM

## 2019-10-28 DIAGNOSIS — J939 Pneumothorax, unspecified: Secondary | ICD-10-CM | POA: Diagnosis not present

## 2019-10-28 DIAGNOSIS — Z01818 Encounter for other preprocedural examination: Secondary | ICD-10-CM | POA: Diagnosis not present

## 2019-10-28 DIAGNOSIS — J438 Other emphysema: Secondary | ICD-10-CM | POA: Diagnosis present

## 2019-10-28 DIAGNOSIS — I10 Essential (primary) hypertension: Secondary | ICD-10-CM | POA: Diagnosis not present

## 2019-10-28 DIAGNOSIS — J9601 Acute respiratory failure with hypoxia: Secondary | ICD-10-CM | POA: Diagnosis not present

## 2019-10-28 DIAGNOSIS — R918 Other nonspecific abnormal finding of lung field: Secondary | ICD-10-CM | POA: Diagnosis not present

## 2019-10-28 HISTORY — PX: VIDEO BRONCHOSCOPY: SHX5072

## 2019-10-28 HISTORY — PX: INTERCOSTAL NERVE BLOCK: SHX5021

## 2019-10-28 HISTORY — PX: NODE DISSECTION: SHX5269

## 2019-10-28 HISTORY — PX: VIDEO BRONCHOSCOPY WITH ENDOBRONCHIAL NAVIGATION: SHX6175

## 2019-10-28 SURGERY — VIDEO BRONCHOSCOPY WITH ENDOBRONCHIAL NAVIGATION
Anesthesia: General | Site: Chest | Laterality: Right

## 2019-10-28 MED ORDER — FENTANYL CITRATE (PF) 100 MCG/2ML IJ SOLN
INTRAMUSCULAR | Status: DC | PRN
  Administered 2019-10-28 (×5): 50 ug via INTRAVENOUS

## 2019-10-28 MED ORDER — ACETAMINOPHEN 500 MG PO TABS
1000.0000 mg | ORAL_TABLET | Freq: Once | ORAL | Status: AC
  Administered 2019-10-28: 1000 mg via ORAL
  Filled 2019-10-28: qty 2

## 2019-10-28 MED ORDER — SIMVASTATIN 20 MG PO TABS
20.0000 mg | ORAL_TABLET | Freq: Every evening | ORAL | Status: DC
  Administered 2019-10-28 – 2019-10-31 (×4): 20 mg via ORAL
  Filled 2019-10-28 (×4): qty 1

## 2019-10-28 MED ORDER — LEVOFLOXACIN 500 MG PO TABS: 500.0000 mg | ORAL_TABLET | Freq: Every day | ORAL | Status: DC

## 2019-10-28 MED ORDER — BISACODYL 5 MG PO TBEC
10.0000 mg | DELAYED_RELEASE_TABLET | Freq: Every day | ORAL | Status: DC
  Administered 2019-10-28 – 2019-11-01 (×5): 10 mg via ORAL
  Filled 2019-10-28 (×5): qty 2

## 2019-10-28 MED ORDER — MIDAZOLAM HCL 2 MG/2ML IJ SOLN
INTRAMUSCULAR | Status: AC
  Filled 2019-10-28: qty 2

## 2019-10-28 MED ORDER — DEXAMETHASONE SODIUM PHOSPHATE 10 MG/ML IJ SOLN
INTRAMUSCULAR | Status: AC
  Filled 2019-10-28: qty 1

## 2019-10-28 MED ORDER — SUGAMMADEX SODIUM 200 MG/2ML IV SOLN
INTRAVENOUS | Status: DC | PRN
  Administered 2019-10-28: 350 mg via INTRAVENOUS

## 2019-10-28 MED ORDER — ROCURONIUM BROMIDE 50 MG/5ML IV SOSY
PREFILLED_SYRINGE | INTRAVENOUS | Status: DC | PRN
  Administered 2019-10-28: 40 mg via INTRAVENOUS
  Administered 2019-10-28: 50 mg via INTRAVENOUS
  Administered 2019-10-28: 60 mg via INTRAVENOUS

## 2019-10-28 MED ORDER — ONDANSETRON HCL 4 MG/2ML IJ SOLN: 4.0000 mg | Freq: Four times a day (QID) | INTRAMUSCULAR | Status: DC | PRN

## 2019-10-28 MED ORDER — SODIUM CHLORIDE FLUSH 0.9 % IV SOLN
INTRAVENOUS | Status: DC | PRN
  Administered 2019-10-28: 100 mL

## 2019-10-28 MED ORDER — OXYCODONE HCL 5 MG PO TABS
5.0000 mg | ORAL_TABLET | ORAL | Status: DC | PRN
  Administered 2019-10-28 – 2019-10-31 (×10): 10 mg via ORAL
  Filled 2019-10-28 (×10): qty 2

## 2019-10-28 MED ORDER — CHLORHEXIDINE GLUCONATE CLOTH 2 % EX PADS
6.0000 | MEDICATED_PAD | Freq: Every day | CUTANEOUS | Status: DC
  Administered 2019-10-29 – 2019-10-30 (×2): 6 via TOPICAL

## 2019-10-28 MED ORDER — ONDANSETRON HCL 4 MG/2ML IJ SOLN
INTRAMUSCULAR | Status: DC | PRN
  Administered 2019-10-28: 4 mg via INTRAVENOUS

## 2019-10-28 MED ORDER — PROPOFOL 10 MG/ML IV BOLUS
INTRAVENOUS | Status: DC | PRN
  Administered 2019-10-28: 100 mg via INTRAVENOUS

## 2019-10-28 MED ORDER — FENTANYL CITRATE (PF) 100 MCG/2ML IJ SOLN
INTRAMUSCULAR | Status: AC
  Filled 2019-10-28: qty 2

## 2019-10-28 MED ORDER — ROCURONIUM BROMIDE 10 MG/ML (PF) SYRINGE
PREFILLED_SYRINGE | INTRAVENOUS | Status: AC
  Filled 2019-10-28: qty 10

## 2019-10-28 MED ORDER — SODIUM CHLORIDE 0.9 % IV SOLN
INTRAVENOUS | Status: AC | PRN
  Administered 2019-10-28: 1000 mL via INTRAMUSCULAR

## 2019-10-28 MED ORDER — EPHEDRINE SULFATE-NACL 50-0.9 MG/10ML-% IV SOSY
PREFILLED_SYRINGE | INTRAVENOUS | Status: DC | PRN
  Administered 2019-10-28: 10 mg via INTRAVENOUS

## 2019-10-28 MED ORDER — FENTANYL CITRATE (PF) 250 MCG/5ML IJ SOLN
INTRAMUSCULAR | Status: AC
  Filled 2019-10-28: qty 5

## 2019-10-28 MED ORDER — FENTANYL CITRATE (PF) 100 MCG/2ML IJ SOLN
25.0000 ug | INTRAMUSCULAR | Status: DC | PRN
  Administered 2019-10-28 (×2): 50 ug via INTRAVENOUS

## 2019-10-28 MED ORDER — ACETAMINOPHEN 500 MG PO TABS
1000.0000 mg | ORAL_TABLET | Freq: Four times a day (QID) | ORAL | Status: DC
  Administered 2019-10-28 – 2019-11-01 (×12): 1000 mg via ORAL
  Filled 2019-10-28 (×13): qty 2

## 2019-10-28 MED ORDER — PROPOFOL 10 MG/ML IV BOLUS
INTRAVENOUS | Status: AC
  Filled 2019-10-28: qty 20

## 2019-10-28 MED ORDER — TRAMADOL HCL 50 MG PO TABS: 50.0000 mg | ORAL_TABLET | Freq: Four times a day (QID) | ORAL | Status: DC | PRN

## 2019-10-28 MED ORDER — 0.9 % SODIUM CHLORIDE (POUR BTL) OPTIME
TOPICAL | Status: DC | PRN
  Administered 2019-10-28: 3000 mL

## 2019-10-28 MED ORDER — LIDOCAINE 2% (20 MG/ML) 5 ML SYRINGE
INTRAMUSCULAR | Status: AC
  Filled 2019-10-28: qty 5

## 2019-10-28 MED ORDER — VANCOMYCIN HCL IN DEXTROSE 1-5 GM/200ML-% IV SOLN
INTRAVENOUS | Status: AC
  Filled 2019-10-28: qty 200

## 2019-10-28 MED ORDER — ALBUTEROL SULFATE (2.5 MG/3ML) 0.083% IN NEBU: 2.5000 mg | INHALATION_SOLUTION | RESPIRATORY_TRACT | Status: DC | PRN

## 2019-10-28 MED ORDER — SERTRALINE HCL 100 MG PO TABS
100.0000 mg | ORAL_TABLET | Freq: Every day | ORAL | Status: DC
  Administered 2019-10-29 – 2019-11-01 (×4): 100 mg via ORAL
  Filled 2019-10-28 (×4): qty 1

## 2019-10-28 MED ORDER — MORPHINE SULFATE (PF) 2 MG/ML IV SOLN: 2.0000 mg | INTRAVENOUS | Status: DC | PRN

## 2019-10-28 MED ORDER — HEMOSTATIC AGENTS (NO CHARGE) OPTIME
TOPICAL | Status: DC | PRN
  Administered 2019-10-28: 2 via TOPICAL

## 2019-10-28 MED ORDER — BUPIVACAINE LIPOSOME 1.3 % IJ SUSP
20.0000 mL | INTRAMUSCULAR | Status: DC
  Filled 2019-10-28: qty 20

## 2019-10-28 MED ORDER — ENOXAPARIN SODIUM 40 MG/0.4ML ~~LOC~~ SOLN
40.0000 mg | SUBCUTANEOUS | Status: DC
  Administered 2019-10-29 – 2019-11-01 (×4): 40 mg via SUBCUTANEOUS
  Filled 2019-10-28 (×4): qty 0.4

## 2019-10-28 MED ORDER — VANCOMYCIN HCL IN DEXTROSE 1-5 GM/200ML-% IV SOLN
1000.0000 mg | INTRAVENOUS | Status: AC
  Administered 2019-10-28: 08:00:00 1000 mg via INTRAVENOUS

## 2019-10-28 MED ORDER — DEXAMETHASONE SODIUM PHOSPHATE 10 MG/ML IJ SOLN
INTRAMUSCULAR | Status: DC | PRN
  Administered 2019-10-28 (×2): 10 mg via INTRAVENOUS

## 2019-10-28 MED ORDER — ALBUTEROL SULFATE HFA 108 (90 BASE) MCG/ACT IN AERS
INHALATION_SPRAY | RESPIRATORY_TRACT | Status: DC | PRN
  Administered 2019-10-28 (×6): 2 via RESPIRATORY_TRACT

## 2019-10-28 MED ORDER — DEXTROSE-NACL 5-0.45 % IV SOLN: INTRAVENOUS | Status: DC

## 2019-10-28 MED ORDER — PHENYLEPHRINE HCL-NACL 10-0.9 MG/250ML-% IV SOLN
INTRAVENOUS | Status: DC | PRN
  Administered 2019-10-28: 50 ug/min via INTRAVENOUS

## 2019-10-28 MED ORDER — SENNOSIDES-DOCUSATE SODIUM 8.6-50 MG PO TABS
1.0000 | ORAL_TABLET | Freq: Every day | ORAL | Status: DC
  Administered 2019-10-28 – 2019-10-31 (×4): 1 via ORAL
  Filled 2019-10-28 (×4): qty 1

## 2019-10-28 MED ORDER — TRAZODONE HCL 50 MG PO TABS
50.0000 mg | ORAL_TABLET | Freq: Every day | ORAL | Status: DC
  Administered 2019-10-28 – 2019-10-31 (×4): 50 mg via ORAL
  Filled 2019-10-28 (×4): qty 1

## 2019-10-28 MED ORDER — VANCOMYCIN HCL IN DEXTROSE 1-5 GM/200ML-% IV SOLN
1000.0000 mg | Freq: Two times a day (BID) | INTRAVENOUS | Status: AC
  Administered 2019-10-28: 1000 mg via INTRAVENOUS
  Filled 2019-10-28: qty 200

## 2019-10-28 MED ORDER — SODIUM CHLORIDE 0.9 % IV SOLN: INTRAVENOUS | Status: DC | PRN

## 2019-10-28 MED ORDER — LACTATED RINGERS IV SOLN: INTRAVENOUS | Status: DC

## 2019-10-28 MED ORDER — MIDAZOLAM HCL 5 MG/5ML IJ SOLN
INTRAMUSCULAR | Status: DC | PRN
  Administered 2019-10-28: 1 mg via INTRAVENOUS

## 2019-10-28 MED ORDER — ACETAMINOPHEN 160 MG/5ML PO SOLN: 1000.0000 mg | Freq: Four times a day (QID) | ORAL | Status: DC

## 2019-10-28 MED ORDER — METHYLENE BLUE 0.5 % INJ SOLN
INTRAVENOUS | Status: AC
  Filled 2019-10-28: qty 10

## 2019-10-28 MED ORDER — ONDANSETRON HCL 4 MG/2ML IJ SOLN
INTRAMUSCULAR | Status: AC
  Filled 2019-10-28: qty 2

## 2019-10-28 MED ORDER — MOMETASONE FURO-FORMOTEROL FUM 200-5 MCG/ACT IN AERO
2.0000 | INHALATION_SPRAY | Freq: Two times a day (BID) | RESPIRATORY_TRACT | Status: DC
  Administered 2019-10-28 – 2019-11-01 (×8): 2 via RESPIRATORY_TRACT
  Filled 2019-10-28: qty 8.8

## 2019-10-28 MED ORDER — METOCLOPRAMIDE HCL 5 MG/ML IJ SOLN
10.0000 mg | Freq: Four times a day (QID) | INTRAMUSCULAR | Status: AC
  Administered 2019-10-28 – 2019-10-29 (×3): 10 mg via INTRAVENOUS
  Filled 2019-10-28 (×3): qty 2

## 2019-10-28 MED ORDER — LIDOCAINE HCL (PF) 1 % IJ SOLN
INTRAMUSCULAR | Status: AC
  Filled 2019-10-28: qty 30

## 2019-10-28 MED ORDER — INDOCYANINE GREEN 25 MG IV SOLR
1.2500 mg | Freq: Once | INTRAVENOUS | Status: AC
  Administered 2019-10-28: 25 mg via INTRAVENOUS
  Filled 2019-10-28: qty 10

## 2019-10-28 SURGICAL SUPPLY — 171 items
ADAPTER BRONCHOSCOPE OLYMPUS (ADAPTER) ×5 IMPLANT
ADAPTER VALVE BIOPSY EBUS (MISCELLANEOUS) IMPLANT
ADH SKN CLS APL DERMABOND .7 (GAUZE/BANDAGES/DRESSINGS) ×3
ADH SKN CLS LQ APL DERMABOND (GAUZE/BANDAGES/DRESSINGS) ×3
ADPR BSCP OLMPS EDG (ADAPTER) ×3
ADPTR VALVE BIOPSY EBUS (MISCELLANEOUS)
APPLIER CLIP ROT 10 11.4 M/L (STAPLE)
APR CLP MED LRG 11.4X10 (STAPLE)
BAG SPEC RTRVL 10 TROC 200 (ENDOMECHANICALS)
BAG SPEC RTRVL LRG 6X4 10 (ENDOMECHANICALS)
BAG TISS RTRVL C300 12X14 (MISCELLANEOUS) ×3
BLADE CLIPPER SURG (BLADE) ×5 IMPLANT
BLADE SURG SZ11 CARB STEEL (BLADE) ×5 IMPLANT
BNDG COHESIVE 4X5 TAN STRL (GAUZE/BANDAGES/DRESSINGS) ×5 IMPLANT
BNDG COHESIVE 6X5 TAN STRL LF (GAUZE/BANDAGES/DRESSINGS) ×5 IMPLANT
BRUSH BIOPSY BRONCH 10 SDTNB (MISCELLANEOUS) IMPLANT
BRUSH BIOPSY BRONCH 10MM SDTNB (MISCELLANEOUS)
BRUSH SUPERTRAX BIOPSY (INSTRUMENTS) IMPLANT
BRUSH SUPERTRAX NDL-TIP CYTO (INSTRUMENTS) ×5 IMPLANT
CANISTER SUCT 3000ML PPV (MISCELLANEOUS) ×10 IMPLANT
CANNULA REDUC XI 12-8 STAPL (CANNULA) ×8
CANNULA REDUC XI 12-8MM STAPL (CANNULA) ×2
CANNULA REDUCER 12-8 DVNC XI (CANNULA) ×6 IMPLANT
CATH THORACIC 28FR (CATHETERS) IMPLANT
CATH THORACIC 28FR RT ANG (CATHETERS) IMPLANT
CATH THORACIC 36FR (CATHETERS) IMPLANT
CATH THORACIC 36FR RT ANG (CATHETERS) IMPLANT
CLIP APPLIE ROT 10 11.4 M/L (STAPLE) IMPLANT
CLIP VESOCCLUDE MED 6/CT (CLIP) IMPLANT
CNTNR URN SCR LID CUP LEK RST (MISCELLANEOUS) ×14 IMPLANT
CONN ST 1/4X3/8  BEN (MISCELLANEOUS) ×5
CONN ST 1/4X3/8 BEN (MISCELLANEOUS) ×1 IMPLANT
CONN Y 3/8X3/8X3/8  BEN (MISCELLANEOUS)
CONN Y 3/8X3/8X3/8 BEN (MISCELLANEOUS) IMPLANT
CONT SPEC 4OZ CLIKSEAL STRL BL (MISCELLANEOUS) ×25 IMPLANT
CONT SPEC 4OZ STRL OR WHT (MISCELLANEOUS) ×50
COVER BACK TABLE 60X90IN (DRAPES) ×5 IMPLANT
COVER SURGICAL LIGHT HANDLE (MISCELLANEOUS) IMPLANT
DEFOGGER SCOPE WARMER CLEARIFY (MISCELLANEOUS) ×5 IMPLANT
DERMABOND ADHESIVE PROPEN (GAUZE/BANDAGES/DRESSINGS) ×2
DERMABOND ADVANCED (GAUZE/BANDAGES/DRESSINGS) ×2
DERMABOND ADVANCED .7 DNX12 (GAUZE/BANDAGES/DRESSINGS) ×3 IMPLANT
DERMABOND ADVANCED .7 DNX6 (GAUZE/BANDAGES/DRESSINGS) ×1 IMPLANT
DRAIN CHANNEL 28F RND 3/8 FF (WOUND CARE) ×3 IMPLANT
DRAIN CHANNEL 32F RND 10.7 FF (WOUND CARE) IMPLANT
DRAPE ARM DVNC X/XI (DISPOSABLE) ×12 IMPLANT
DRAPE COLUMN DVNC XI (DISPOSABLE) ×3 IMPLANT
DRAPE CV SPLIT W-CLR ANES SCRN (DRAPES) ×5 IMPLANT
DRAPE DA VINCI XI ARM (DISPOSABLE) ×20
DRAPE DA VINCI XI COLUMN (DISPOSABLE) ×5
DRAPE INCISE IOBAN 66X45 STRL (DRAPES) IMPLANT
DRAPE ORTHO SPLIT 77X108 STRL (DRAPES) ×5
DRAPE SURG ORHT 6 SPLT 77X108 (DRAPES) ×3 IMPLANT
DRAPE WARM FLUID 44X44 (DRAPES) IMPLANT
ELECT BLADE 6.5 EXT (BLADE) IMPLANT
ELECT REM PT RETURN 9FT ADLT (ELECTROSURGICAL) ×5
ELECTRODE REM PT RTRN 9FT ADLT (ELECTROSURGICAL) ×3 IMPLANT
FILTER STRAW FLUID ASPIR (MISCELLANEOUS) ×2 IMPLANT
FORCEPS BIOP SUPERTRX PREMAR (INSTRUMENTS) IMPLANT
GAUZE KITTNER 4X5 RF (MISCELLANEOUS) ×2 IMPLANT
GAUZE KITTNER 4X8 (MISCELLANEOUS) ×10 IMPLANT
GAUZE SPONGE 4X4 12PLY STRL (GAUZE/BANDAGES/DRESSINGS) ×5 IMPLANT
GAUZE SPONGE 4X4 12PLY STRL LF (GAUZE/BANDAGES/DRESSINGS) ×2 IMPLANT
GLOVE BIO SURGEON STRL SZ 6.5 (GLOVE) ×7 IMPLANT
GLOVE BIO SURGEON STRL SZ7.5 (GLOVE) ×6 IMPLANT
GLOVE BIO SURGEONS STRL SZ 6.5 (GLOVE) ×7
GLOVE SURG SIGNA 7.5 PF LTX (GLOVE) ×12 IMPLANT
GLOVE TRIUMPH SURG SIZE 7.5 (KITS) ×6 IMPLANT
GOWN STRL REUS W/ TWL LRG LVL3 (GOWN DISPOSABLE) ×8 IMPLANT
GOWN STRL REUS W/ TWL XL LVL3 (GOWN DISPOSABLE) ×12 IMPLANT
GOWN STRL REUS W/TWL LRG LVL3 (GOWN DISPOSABLE) ×20
GOWN STRL REUS W/TWL XL LVL3 (GOWN DISPOSABLE) ×20
HEMOSTAT SURGICEL 2X14 (HEMOSTASIS) ×13 IMPLANT
IRRIGATION STRYKERFLOW (MISCELLANEOUS) IMPLANT
IRRIGATOR STRYKERFLOW (MISCELLANEOUS)
KIT BASIN OR (CUSTOM PROCEDURE TRAY) ×5 IMPLANT
KIT CLEAN ENDO COMPLIANCE (KITS) ×5 IMPLANT
KIT ILLUMISITE 180 PROCEDURE (KITS) ×3 IMPLANT
KIT ILLUMISITE 90 PROCEDURE (KITS) IMPLANT
KIT SUCTION CATH 14FR (SUCTIONS) IMPLANT
KIT TURNOVER KIT B (KITS) ×5 IMPLANT
LOOP VESSEL SUPERMAXI WHITE (MISCELLANEOUS) IMPLANT
MARKER SKIN DUAL TIP RULER LAB (MISCELLANEOUS) ×5 IMPLANT
NDL HYPO 25GX1X1/2 BEV (NEEDLE) ×2 IMPLANT
NDL SPNL 22GX3.5 QUINCKE BK (NEEDLE) ×3 IMPLANT
NDL SUPERTRX PREMARK BIOPSY (NEEDLE) IMPLANT
NEEDLE HYPO 25GX1X1/2 BEV (NEEDLE) ×5 IMPLANT
NEEDLE SPNL 22GX3.5 QUINCKE BK (NEEDLE) ×5 IMPLANT
NEEDLE SUPERTRX PREMARK BIOPSY (NEEDLE) IMPLANT
NS IRRIG 1000ML POUR BTL (IV SOLUTION) ×5 IMPLANT
OIL SILICONE PENTAX (PARTS (SERVICE/REPAIRS)) ×5 IMPLANT
PACK CHEST (CUSTOM PROCEDURE TRAY) ×5 IMPLANT
PAD ARMBOARD 7.5X6 YLW CONV (MISCELLANEOUS) ×10 IMPLANT
PATCHES PATIENT (LABEL) ×15 IMPLANT
PATTIES SURGICAL 3 X3 (GAUZE/BANDAGES/DRESSINGS)
PATTIES SURGICAL 3X3 (GAUZE/BANDAGES/DRESSINGS) IMPLANT
POUCH ENDO CATCH II 15MM (MISCELLANEOUS) IMPLANT
POUCH RETRIEVAL ECOSAC 10 (ENDOMECHANICALS) ×2 IMPLANT
POUCH RETRIEVAL ECOSAC 10MM (ENDOMECHANICALS)
POUCH SPECIMEN RETRIEVAL 10MM (ENDOMECHANICALS) IMPLANT
RELOAD STAPLE 45 3.5 BLU DVNC (STAPLE) IMPLANT
RELOAD STAPLE 45 4.3 GRN DVNC (STAPLE) IMPLANT
RELOAD STAPLER 3.5X45 BLU DVNC (STAPLE) ×12 IMPLANT
RELOAD STAPLER 4.3X45 GRN DVNC (STAPLE) ×9 IMPLANT
SCISSORS LAP 5X35 DISP (ENDOMECHANICALS) IMPLANT
SEAL CANN UNIV 5-8 DVNC XI (MISCELLANEOUS) ×6 IMPLANT
SEAL XI 5MM-8MM UNIVERSAL (MISCELLANEOUS) ×10
SEALANT PROGEL (MISCELLANEOUS) IMPLANT
SEALANT SURG COSEAL 4ML (VASCULAR PRODUCTS) IMPLANT
SEALANT SURG COSEAL 8ML (VASCULAR PRODUCTS) IMPLANT
SET TUBE SMOKE EVAC HIGH FLOW (TUBING) ×5 IMPLANT
SHEARS HARMONIC HDI 20CM (ELECTROSURGICAL) IMPLANT
SHEET MEDIUM DRAPE 40X70 STRL (DRAPES) ×5 IMPLANT
SOL ANTI FOG 6CC (MISCELLANEOUS) IMPLANT
SOLUTION ANTI FOG 6CC (MISCELLANEOUS) ×2
SOLUTION ELECTROLUBE (MISCELLANEOUS) ×3 IMPLANT
SPECIMEN JAR MEDIUM (MISCELLANEOUS) IMPLANT
SPONGE INTESTINAL PEANUT (DISPOSABLE) IMPLANT
SPONGE TONSIL TAPE 1 RFD (DISPOSABLE) IMPLANT
STAPLER 45 DA VINCI SURE FORM (STAPLE) ×5
STAPLER 45 SUREFORM DVNC (STAPLE) IMPLANT
STAPLER CANNULA SEAL DVNC XI (STAPLE) ×6 IMPLANT
STAPLER CANNULA SEAL XI (STAPLE) ×10
STAPLER RELOAD 3.5X45 BLU DVNC (STAPLE) ×12
STAPLER RELOAD 3.5X45 BLUE (STAPLE) ×20
STAPLER RELOAD 4.3X45 GREEN (STAPLE) ×15
STAPLER RELOAD 4.3X45 GRN DVNC (STAPLE) ×9
STOPCOCK 4 WAY LG BORE MALE ST (IV SETS) ×5 IMPLANT
SUT PDS AB 3-0 SH 27 (SUTURE) IMPLANT
SUT PROLENE 4 0 RB 1 (SUTURE)
SUT PROLENE 4-0 RB1 .5 CRCL 36 (SUTURE) IMPLANT
SUT SILK  1 MH (SUTURE) ×10
SUT SILK 1 MH (SUTURE) ×6 IMPLANT
SUT SILK 1 TIES 10X30 (SUTURE) ×5 IMPLANT
SUT SILK 2 0 SH (SUTURE) ×2 IMPLANT
SUT SILK 2 0SH CR/8 30 (SUTURE) IMPLANT
SUT SILK 3 0 SH 30 (SUTURE) IMPLANT
SUT SILK 3 0SH CR/8 30 (SUTURE) IMPLANT
SUT VIC AB 1 CTX 36 (SUTURE)
SUT VIC AB 1 CTX36XBRD ANBCTR (SUTURE) IMPLANT
SUT VIC AB 2-0 CTX 36 (SUTURE) IMPLANT
SUT VIC AB 3-0 MH 27 (SUTURE) IMPLANT
SUT VIC AB 3-0 X1 27 (SUTURE) ×5 IMPLANT
SUT VICRYL 0 TIES 12 18 (SUTURE) ×5 IMPLANT
SUT VICRYL 0 UR6 27IN ABS (SUTURE) ×10 IMPLANT
SUT VICRYL 2 TP 1 (SUTURE) IMPLANT
SYR 10ML LL (SYRINGE) ×5 IMPLANT
SYR 20ML ECCENTRIC (SYRINGE) ×5 IMPLANT
SYR 20ML LL LF (SYRINGE) ×5 IMPLANT
SYR 30ML LL (SYRINGE) ×5 IMPLANT
SYR 50ML LL SCALE MARK (SYRINGE) ×5 IMPLANT
SYR 5ML LL (SYRINGE) ×5 IMPLANT
SYSTEM RETRIEVAL ANCHOR 12 (MISCELLANEOUS) ×3 IMPLANT
SYSTEM SAHARA CHEST DRAIN ATS (WOUND CARE) ×5 IMPLANT
TAPE CLOTH 4X10 WHT NS (GAUZE/BANDAGES/DRESSINGS) ×5 IMPLANT
TAPE CLOTH SURG 4X10 WHT LF (GAUZE/BANDAGES/DRESSINGS) ×2 IMPLANT
TIP APPLICATOR SPRAY EXTEND 16 (VASCULAR PRODUCTS) IMPLANT
TOWEL GREEN STERILE (TOWEL DISPOSABLE) ×5 IMPLANT
TOWEL GREEN STERILE FF (TOWEL DISPOSABLE) ×5 IMPLANT
TRAP SPECIMEN MUCOUS 40CC (MISCELLANEOUS) ×5 IMPLANT
TRAY FOLEY MTR SLVR 16FR STAT (SET/KITS/TRAYS/PACK) ×5 IMPLANT
TROCAR BLADELESS 15MM (ENDOMECHANICALS) IMPLANT
TROCAR XCEL 12X100 BLDLESS (ENDOMECHANICALS) ×5 IMPLANT
TUBE CONNECTING 20'X1/4 (TUBING) ×2
TUBE CONNECTING 20X1/4 (TUBING) ×8 IMPLANT
TUBING EXTENTION W/L.L. (IV SETS) ×5 IMPLANT
UNDERPAD 30X30 (UNDERPADS AND DIAPERS) ×5 IMPLANT
VALVE BIOPSY  SINGLE USE (MISCELLANEOUS) ×5
VALVE BIOPSY SINGLE USE (MISCELLANEOUS) ×3 IMPLANT
VALVE SUCTION BRONCHIO DISP (MISCELLANEOUS) ×5 IMPLANT
WATER STERILE IRR 1000ML POUR (IV SOLUTION) ×5 IMPLANT

## 2019-10-28 NOTE — Anesthesia Procedure Notes (Signed)
Central Venous Catheter Insertion Performed by: Roderic Palau, MD, anesthesiologist Start/End3/15/2021 6:55 AM, 10/28/2019 7:05 AM Patient location: Pre-op. Preanesthetic checklist: patient identified, IV checked, site marked, risks and benefits discussed, surgical consent, monitors and equipment checked, pre-op evaluation, timeout performed and anesthesia consent Position: Trendelenburg Lidocaine 1% used for infiltration and patient sedated Hand hygiene performed , maximum sterile barriers used  and Seldinger technique used Catheter size: 8 Fr Total catheter length 16. Central line was placed.Double lumen Procedure performed using ultrasound guided technique. Ultrasound Notes:anatomy identified, needle tip was noted to be adjacent to the nerve/plexus identified, no ultrasound evidence of intravascular and/or intraneural injection and image(s) printed for medical record Attempts: 1 Following insertion, dressing applied, line sutured and Biopatch. Post procedure assessment: blood return through all ports  Patient tolerated the procedure well with no immediate complications.

## 2019-10-28 NOTE — Progress Notes (Signed)
Transferred -in from PACU  by awake and alert.

## 2019-10-28 NOTE — Brief Op Note (Signed)
10/28/2019  11:15 AM  PATIENT:  [name]  79 y.o. male  PRE-OPERATIVE DIAGNOSIS:  RUL NODULE  POST-OPERATIVE DIAGNOSIS:  Squamous cell carcinoma right upper lobe, clinical stage IA (T1N0)  PROCEDURE:  Procedure(s):  VIDEO BRONCHOSCOPY WITH ENDOBRONCHIAL NAVIGATION (N/A) XI ROBOTIC ASSISTED THORASCOPY  -Right Upper Lobe Wedge Resection -Lymph Node Dissection -Intercostal Nerve Block (Right)  SURGEON:  Surgeon(s) and Role:    * [name], MD - Primary    * Grace Isaac, MD - Assisting  PHYSICIAN ASSISTANT: Ellwood Handler PA-C  ANESTHESIA:   general  EBL:  100 mL   BLOOD ADMINISTERED:none  DRAINS: [address]   LOCAL MEDICATIONS USED:  BUPIVICAINE   SPECIMEN:  Source of Specimen:  Lymph Nodes, Wedge resection RUL  DISPOSITION OF SPECIMEN:  PATHOLOGY  COUNTS:  YES  TOURNIQUET:  * No tourniquets in log *  DICTATION: .Dragon Dictation  PLAN OF CARE: Admit to inpatient   PATIENT DISPOSITION:  PACU - hemodynamically stable.   Delay start of Pharmacological VTE agent (>24hrs) due to surgical blood loss or risk of bleeding: no

## 2019-10-28 NOTE — Transfer of Care (Signed)
Immediate Anesthesia Transfer of Care Note  Patient: [name]  Procedure(s) Performed: VIDEO BRONCHOSCOPY WITH ENDOBRONCHIAL NAVIGATION (N/A ) XI ROBOTIC ASSISTED THORASCOPY RIGHT UPPER LOBE LUNG WEDGE RESECTION (Right Chest) Node Dissection (Chest) Intercostal Nerve Block (Right Chest)  Patient Location: PACU  Anesthesia Type:General  Level of Consciousness: drowsy and patient cooperative  Airway & Oxygen Therapy: Patient Spontanous Breathing and Patient connected to face mask oxygen  Post-op Assessment: Report given to RN and Post -op Vital signs reviewed and stable  Post vital signs: Reviewed and stable  Last Vitals:  Vitals Value Taken Time  BP    Temp    Pulse    Resp 23 10/28/19 1118  SpO2    Vitals shown include unvalidated device data.  Last Pain:  Vitals:   10/28/19 0614  TempSrc:   PainSc: 0-No pain         Complications: No apparent anesthesia complications

## 2019-10-28 NOTE — Anesthesia Procedure Notes (Signed)
Procedure Name: Intubation Date/Time: 10/28/2019 7:46 AM Performed by: Lance Coon, CRNA Pre-anesthesia Checklist: Patient identified, Emergency Drugs available, Suction available, Patient being monitored and Timeout performed Patient Re-evaluated:Patient Re-evaluated prior to induction Oxygen Delivery Method: Circle system utilized Preoxygenation: Pre-oxygenation with 100% oxygen Induction Type: IV induction Ventilation: Mask ventilation without difficulty Laryngoscope Size: Miller and 3 Grade View: Grade II Tube type: Oral Tube size: 8.5 mm Number of attempts: 1 Airway Equipment and Method: Stylet Placement Confirmation: ETT inserted through vocal cords under direct vision,  positive ETCO2 and breath sounds checked- equal and bilateral Secured at: 21 cm Tube secured with: Tape Dental Injury: Teeth and Oropharynx as per pre-operative assessment

## 2019-10-28 NOTE — Anesthesia Postprocedure Evaluation (Signed)
Anesthesia Post Note  Patient: [name]  Procedure(s) Performed: VIDEO BRONCHOSCOPY WITH ENDOBRONCHIAL NAVIGATION (N/A ) XI ROBOTIC ASSISTED THORASCOPY RIGHT UPPER LOBE LUNG WEDGE RESECTION (Right Chest) Node Dissection (Chest) Intercostal Nerve Block (Right Chest)     Patient location during evaluation: PACU Anesthesia Type: General Level of consciousness: awake and alert Pain management: pain level controlled Vital Signs Assessment: post-procedure vital signs reviewed and stable Respiratory status: spontaneous breathing, nonlabored ventilation, respiratory function stable and patient connected to nasal cannula oxygen Cardiovascular status: blood pressure returned to baseline and stable Postop Assessment: no apparent nausea or vomiting Anesthetic complications: no    Last Vitals:  Vitals:   10/28/19 1300 10/28/19 1315  BP: (!) 103/54 (!) 110/55  Pulse: 89 88  Resp: 13 16  Temp:  (!) 36.2 C  SpO2: 96% 96%    Last Pain:  Vitals:   10/28/19 1315  TempSrc:   PainSc: Asleep                 Javis Abboud,W. EDMOND

## 2019-10-28 NOTE — Interval H&P Note (Signed)
History and Physical Interval Note:  10/28/2019 7:25 AM  [name]  has presented today for surgery, with the diagnosis of RUL NODULE.  The various methods of treatment have been discussed with the patient and family. After consideration of risks, benefits and other options for treatment, the patient has consented to  Procedure(s): [place] (N/A) XI ROBOTIC ASSISTED THORASCOPY-WEDGE RESECTION (Right) as a surgical intervention.  The patient's history has been reviewed, patient examined, no change in status, stable for surgery.  I have reviewed the patient's chart and labs.  Questions were answered to the patient's satisfaction.     [name]

## 2019-10-29 ENCOUNTER — Inpatient Hospital Stay (HOSPITAL_COMMUNITY): Payer: Medicare Other

## 2019-10-29 LAB — POCT I-STAT 7, (LYTES, BLD GAS, ICA,H+H)
Acid-Base Excess: 1 mmol/L (ref 0.0–2.0)
Acid-base deficit: 2 mmol/L (ref 0.0–2.0)
Bicarbonate: 29.2 mmol/L — ABNORMAL HIGH (ref 20.0–28.0)
Bicarbonate: 30.9 mmol/L — ABNORMAL HIGH (ref 20.0–28.0)
Calcium, Ion: 1.27 mmol/L (ref 1.15–1.40)
Calcium, Ion: 1.21 mmol/L (ref 1.15–1.40)
HCT: 44 % (ref 39.0–52.0)
HCT: 44 % (ref 39.0–52.0)
Hemoglobin: 15 g/dL (ref 13.0–17.0)
Hemoglobin: 15 g/dL (ref 13.0–17.0)
O2 Saturation: 88 %
O2 Saturation: 100 %
Potassium: 4.2 mmol/L (ref 3.5–5.1)
Potassium: 4.6 mmol/L (ref 3.5–5.1)
Sodium: 137 mmol/L (ref 135–145)
Sodium: 136 mmol/L (ref 135–145)
TCO2: 32 mmol/L (ref 22–32)
TCO2: 33 mmol/L — ABNORMAL HIGH (ref 22–32)
pCO2 arterial: 77.5 mmHg (ref 32.0–48.0)
pCO2 arterial: 70.4 mmHg (ref 32.0–48.0)
pH, Arterial: 7.184 — CL (ref 7.350–7.450)
pH, Arterial: 7.25 — ABNORMAL LOW (ref 7.350–7.450)
pO2, Arterial: 71 mmHg — ABNORMAL LOW (ref 83.0–108.0)
pO2, Arterial: 591 mmHg — ABNORMAL HIGH (ref 83.0–108.0)

## 2019-10-29 LAB — BLOOD GAS, ARTERIAL
Acid-Base Excess: 5.4 mmol/L — ABNORMAL HIGH (ref 0.0–2.0)
Bicarbonate: 30.3 mmol/L — ABNORMAL HIGH (ref 20.0–28.0)
FIO2: 32
O2 Saturation: 95.3 %
Patient temperature: 37
pCO2 arterial: 52.4 mmHg — ABNORMAL HIGH (ref 32.0–48.0)
pH, Arterial: 7.38 (ref 7.350–7.450)
pO2, Arterial: 76.6 mmHg — ABNORMAL LOW (ref 83.0–108.0)

## 2019-10-29 LAB — CBC
HCT: 40.9 % (ref 39.0–52.0)
Hemoglobin: 13.8 g/dL (ref 13.0–17.0)
MCH: 31.7 pg (ref 26.0–34.0)
MCHC: 33.7 g/dL (ref 30.0–36.0)
MCV: 94 fL (ref 80.0–100.0)
Platelets: 242 10*3/uL (ref 150–400)
RBC: 4.35 MIL/uL (ref 4.22–5.81)
RDW: 13.7 % (ref 11.5–15.5)
WBC: 16.1 10*3/uL — ABNORMAL HIGH (ref 4.0–10.5)
nRBC: 0 % (ref 0.0–0.2)

## 2019-10-29 LAB — BASIC METABOLIC PANEL
Anion gap: 7 (ref 5–15)
BUN: 13 mg/dL (ref 8–23)
CO2: 29 mmol/L (ref 22–32)
Calcium: 8.7 mg/dL — ABNORMAL LOW (ref 8.9–10.3)
Chloride: 101 mmol/L (ref 98–111)
Creatinine, Ser: 0.78 mg/dL (ref 0.61–1.24)
GFR calc Af Amer: >60 mL/min (ref >60)
GFR calc non Af Amer: >60 mL/min (ref >60)
Glucose, Bld: 135 mg/dL — ABNORMAL HIGH (ref 70–99)
Potassium: 4.2 mmol/L (ref 3.5–5.1)
Sodium: 137 mmol/L (ref 135–145)

## 2019-10-29 LAB — SURGICAL PATHOLOGY

## 2019-10-29 MED ORDER — ALUM & MAG HYDROXIDE-SIMETH 200-200-20 MG/5ML PO SUSP: 15.0000 mL | ORAL | Status: DC | PRN

## 2019-10-29 NOTE — Discharge Summary (Addendum)
Physician Discharge Summary  Patient ID: [name] MRN: [id number] DOB/AGE: 81/23/1942 81 y.o.  Admit date: 10/28/2019 Discharge date: 11/01/2019  Admission Diagnoses:  Patient Active Problem List   Diagnosis Date Noted  . Nodule of upper lobe of right lung 10/08/2019  . Hypertension 09/30/2019  . Goals of care, counseling/discussion 06/25/2019  . Acute bronchitis 10/29/2018  . Influenza A 10/29/2018  . Acute respiratory failure with hypoxia ([place]) 10/29/2018  . Atherosclerosis of native artery of both lower extremities with intermittent claudication ([place]) 03/01/2018  . Former tobacco use 03/01/2018  . PVD (peripheral vascular disease) ([place]) 03/01/2018  . 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 Mass of upper lobe of left lung 12/12/2016  . Lung nodule 11/07/2016  . Thrush 06/30/2013  . Sleep apnea 06/11/2008  . TOBACCO USER 04/09/2008  . ALLERGIC RHINITIS 04/09/2008  . ASTHMA 04/09/2008  . COPD (chronic obstructive pulmonary disease) ([place]) 04/09/2008   Discharge Diagnoses:  Squamous cell carcinoma right upper lobe- Clinical and pathologic stage IA (T1N0)  Patient Active Problem List   Diagnosis Date Noted  . S/P partial lobectomy of lung 10/28/2019  . Nodule of upper lobe of right lung 10/08/2019  . Hypertension 09/30/2019  . Goals of care, counseling/discussion 06/25/2019  . Acute bronchitis 10/29/2018  . Influenza A 10/29/2018  . Acute respiratory failure with hypoxia ([place]) 10/29/2018  . Atherosclerosis of native artery of both lower extremities with intermittent claudication ([place]) 03/01/2018  . Former tobacco use 03/01/2018  . PVD (peripheral vascular disease) ([place]) 03/01/2018  . 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 Mass of upper lobe of left lung 12/12/2016  . Lung nodule 11/07/2016  . Thrush 06/30/2013  . Sleep apnea 06/11/2008  . TOBACCO USER 04/09/2008  . ALLERGIC RHINITIS 04/09/2008  . ASTHMA 04/09/2008   . COPD (chronic obstructive pulmonary disease) ([place]) 04/09/2008   Discharged Condition: good  History of Present Illness:   [name] is an 79 yo white male with known history of tobacco abuse, COPD, obesity, anxiety/depression, sleep apnea, and stage Ib squamous cell carcinoma in 2018.  He underwent surgical resection with a lingular sparing left upper lobectomy.  Since that time he has been followed with routine serial CT scans.  He was noted to have a new nodule in the right upper lobe.  This nodule had slowly increased in size on follow up exams.  PET CT scan was obtained in October and showed the lesion to be hypermetabolic.  It was recommended the patient undergo surgical resection at that time, but he did not wish to proceed and wanted to wait until repeat scan scheduled for February to be performed.  This again noted an increase in size to the nodule in the right upper lobe.  He was again evaluated by [name] on 10/02/2019 at which time the patient was offered surgical resection vs. Stereotactic radiation.  The advantages and disadvantages were explained to the patient and he wished to speak with radiation oncology prior to making a decision.  He ultimately decided to proceed with surgical resection.  The risks and benefits of the procedure were explained to the patient and he was agreeable to proceed.  Hospital Course:   [name] presented to [facility] on 10/28/2019.  He was taken to the operating room and underwent Robotic assisted wedge resection right upper lobe, lymph node dissection, and intercostal nerve block.  The patient's done well post operatively.  His  chest tube was left on water seal from the operating room.  He had a small air leak.  His CXR showed a trace apical space which was expected.  His CXR remained stable.  His air leak resolved.  His chest tube was removed on 10/30/2019.  Follow up CXR showed stable appearance of apical space.  The patient developed  worsening atelectasis on CXR.  He was treated with aggressive pulmonary toilet.  He was unable to wean off oxygen.  He uses this nightly at home, but will require daily use at 3L.  We will continue to wean usage as able.  He is ambulating without difficulty.  His incisions are healing without evidence of infection.  He is medically stable for discharge home today.  Significant Diagnostic Studies:   nuclear medicine:   1. There is mild increased FDG uptake associated with the small posterior right upper lobe lung nodule. Nodule measures 1.1 cm and has an SUV max of 2.85. This is suspicious for either a focus of metastatic disease or new primary lung neoplasm. 2. Small focus of mild increased FDG uptake within segment 3 of the liver. The degree of uptake is above background liver activity. There is no CT correlate on today's exam or on the contrast enhanced CT from 05/29/2019. This warrants close attention on follow-up imaging. Alternatively, more definitive assessment of the liver may be obtained with a contrast enhanced MRI of the liver.  Pathology: SURGICAL PATHOLOGY  CASE: [id number]  PATIENT: [facility]  Surgical Pathology Report   Clinical History: RUL nodule (cm)   FINAL MICROSCOPIC DIAGNOSIS:   A. LUNG, RIGHT UPPER LOBE, WEDGE RESECTION:  - Invasive squamous cell carcinoma, well differentiated, spanning 1.3  cm.  - The surgical resection margins are negative for carcinoma.  - See oncology table below.   B. LYMPH NODE, LEVEL 9, EXCISION:  - There is no evidence of carcinoma in 1 of 1 lymph node (0/1).   C. LYMPH NODE, LEVEL 7, EXCISION:  - There is no evidence of carcinoma in 1 of 1 lymph node (0/1).   D. LYMPH NODE, 4R, EXCISION:  - Benign fibroadipose tissue.  - Lymph nodal tissue is not identified.   E. LYMPH NODE, 2R, EXCISION:  - There is no evidence of carcinoma in 1 of 1 lymph node (0/1).   Treatments: surgery:   Video bronchoscopy with electromagnetic  navigation, XI robotic-assisted right thoracoscopy, right upper lobe wedge resection, lymph node dissection, and intercostal nerve blocks at levels 3 through 10.  Discharge Exam: Blood pressure (!) 152/74, pulse 89, temperature 98.1 F (36.7 C), temperature source Oral, resp. rate 20, height 5\' 6"  (1.676 m), weight 88.1 kg, SpO2 95 %.  General appearance: alert, cooperative and no distress Heart: regular rate and rhythm Lungs: clear to auscultation bilaterally Abdomen: soft, non-tender; bowel sounds normal; no masses,  no organomegaly Extremities: extremities normal, atraumatic, no cyanosis or edema Wound: clean and dry  Discharge Medications:   Allergies as of 11/01/2019      Reactions   Nsaids Other (See Comments)   MD RESTRICTS USE OF ANY "BLOOD THINING" MEDS [ "DUE TO GROWTH IN BLADDER" ]    Gadolinium Derivatives Nausea And Vomiting, Other (See Comments)   CHILLS SHAKING FEELING COLD   Penicillins Other (See Comments)   "Feels like my skin is crawling" Has patient had a PCN reaction causing immediate rash, facial/tongue/throat swelling, SOB or lightheadedness with hypotension: No Has patient had a PCN reaction causing severe rash involving mucus  membranes or skin necrosis: No Has patient had a PCN reaction that required hospitalization No Has patient had a PCN reaction occurring within the last 10 years: No If all of the above answers are "NO", then may proceed with Cephalosporin use.      Medication List    TAKE these medications   acetaminophen 325 MG tablet Commonly known as: TYLENOL Take 2 tablets (650 mg total) by mouth every 6 (six) hours as needed for mild pain (or Fever >/= 101).   albuterol (2.5 MG/3ML) 0.083% nebulizer solution Commonly known as: PROVENTIL Take 3 mLs (2.5 mg total) by nebulization every 2 (two) hours as needed for wheezing.   fexofenadine 180 MG tablet Commonly known as: ALLEGRA Take 180 mg by mouth daily as needed for allergies or  rhinitis.   levofloxacin 500 MG tablet Commonly known as: LEVAQUIN Take 500 mg by mouth daily.   oxyCODONE 5 MG immediate release tablet Commonly known as: Oxy IR/ROXICODONE Take 1-2 tablets (5-10 mg total) by mouth every 4 (four) hours as needed for moderate pain.   sertraline 100 MG tablet Commonly known as: ZOLOFT Take 100 mg by mouth daily.   simvastatin 20 MG tablet Commonly known as: ZOCOR Take 20 mg by mouth every evening.   Symbicort 160-4.5 MCG/ACT inhaler Generic drug: budesonide-formoterol Inhale 2 puffs into the lungs 2 (two) times daily.   traZODone 50 MG tablet Commonly known as: DESYREL Take 50 mg by mouth at bedtime.            Durable Medical Equipment  (From admission, onward)         Start     Ordered   11/01/19 0909  For home use only DME oxygen  Once    Comments: Patient has oxygen at home.  Uses nightly when he is sleeping.  States he has full and portable tanks  Question Answer Comment  Length of Need 6 Months   Mode or (Route) Nasal cannula   Liters per Minute 3   Frequency Continuous (stationary and portable oxygen unit needed)   Oxygen conserving device No   Oxygen delivery system Gas      11/01/19 0908   10/31/19 0718  For home use only DME Walker rolling  Once    Question Answer Comment  Walker: With [place]   Patient needs a walker to treat with the following condition S/P partial lobectomy of lung      10/31/19 0717         Follow-up Information    [name], MD Follow up on 11/12/2019.   Specialty: Cardiothoracic Surgery Why: Appointment at 3:45, please get CXR at 3:15 at [facility] located on first floor of our office building Contact information: [address] [place] [address] [phone]           Signed:  Ellwood Handler, PA-C  11/01/2019, 11:56 AM

## 2019-10-29 NOTE — Progress Notes (Signed)
Complained of indigestion, assisted to the bathroom, claimed to passed out gas. Ginger ale  given tolerated well. Claimed with relief. TCTS PA aware with order , cont. to monitor.

## 2019-10-29 NOTE — Progress Notes (Signed)
Chest tube  Site dressing saturated with  Serosanguinous  Drainage. Dressing changed, tolerated well.

## 2019-10-29 NOTE — Plan of Care (Signed)
  Problem: Education: Goal: Knowledge of General Education information will improve Description: Including pain rating scale, medication(s)/side effects and non-pharmacologic comfort measures Outcome: Progressing   Problem: Education: Goal: Knowledge of disease or condition will improve Outcome: Progressing Goal: Knowledge of the prescribed therapeutic regimen will improve Outcome: Progressing   Problem: Activity: Goal: Risk for activity intolerance will decrease Outcome: Progressing   Problem: Cardiac: Goal: Will achieve and/or maintain hemodynamic stability Outcome: Progressing   Problem: Clinical Measurements: Goal: Postoperative complications will be avoided or minimized Outcome: Progressing   Problem: Respiratory: Goal: Respiratory status will improve Outcome: Progressing   Problem: Pain Management: Goal: Pain level will decrease Outcome: Progressing   Problem: Skin Integrity: Goal: Wound healing without signs and symptoms infection will improve Outcome: Progressing

## 2019-10-29 NOTE — Discharge Instructions (Signed)
Video-Assisted Thoracic Surgery, Care After This sheet gives you information about how to care for yourself after your procedure. Your health care provider may also give you more specific instructions. If you have problems or questions, contact your health care provider. What can I expect after the procedure? After the procedure, it is common to have:  Some pain and soreness in your chest.  Pain when breathing in (inhaling) and coughing.  Constipation.  Fatigue.  Difficulty sleeping. Follow these instructions at home: Preventing pneumonia  Take deep breaths or do breathing exercises as instructed by your health care provider. Doing this helps prevent lung infection (pneumonia).  Cough frequently. Coughing may cause discomfort, but it is important to clear mucus (phlegm) and expand your lungs. If it hurts to cough, hold a pillow against your chest or place the palms of both hands on top of the incision (use splinting) when you cough. This may help relieve discomfort.  If you were given an incentive spirometer, use it as directed. An incentive spirometer is a tool that measures how well you are filling your lungs with each breath.  Participate in pulmonary rehabilitation as directed by your health care provider. This is a program that combines education, exercise, and support from a team of specialists. The goal is to help you heal and get back to your normal activities as soon as possible. Medicines  Take over-the-counter or prescription medicines only as told by your health care provider.  If you have pain, take pain-relieving medicine before your pain becomes severe. This is important because if your pain is under control, you will be able to breathe and cough more comfortably.  If you were prescribed an antibiotic medicine, take it as told by your health care provider. Do not stop taking the antibiotic even if you start to feel better. Activity  Ask your health care provider what  activities are safe for you.  Avoid activities that use your chest muscles for at least 3-4 weeks.  Do not lift anything that is heavier than 10 lb (4.5 kg), or the limit that your health care provider tells you, until he or she says that it is safe. Incision care  Follow instructions from your health care provider about how to take care of your incision(s). Make sure you: ? Wash your hands with soap and water before you change your bandage (dressing). If soap and water are not available, use hand sanitizer. ? Change your dressing as told by your health care provider. ? Leave stitches (sutures), skin glue, or adhesive strips in place. These skin closures may need to stay in place for 2 weeks or longer. If adhesive strip edges start to loosen and curl up, you may trim the loose edges. Do not remove adhesive strips completely unless your health care provider tells you to do that.  Keep your dressing dry until it has been removed.  Check your incision area every day for signs of infection. Check for: ? Redness, swelling, or pain. ? Fluid or blood. ? Warmth. ? Pus or a bad smell. Bathing  Do not take baths, swim, or use a hot tub until your health care provider approves. You may take showers.  After your dressing has been removed, use soap and water to gently wash your incision area. Do not use anything else to clean your incision(s) unless your health care provider tells you to do this. Driving   Do not drive until your health care provider approves.  Do not drive or  use heavy machinery while taking prescription pain medicine. Eating and drinking  Eat a healthy, balanced diet as instructed by your health care provider. A healthy diet includes plenty of fresh fruits and vegetables, whole grains, and low-fat (lean) proteins.  Limit foods that are high in fat and processed sugars, such as fried and sweet foods.  Drink enough fluid to keep your urine clear or pale yellow. General  instructions   To prevent or treat constipation while you are taking prescription pain medicine, your health care provider may recommend that you: ? Take over-the-counter or prescription medicines. ? Eat foods that are high in fiber, such as beans, fresh fruits and vegetables, and whole grains.  Do not use any products that contain nicotine or tobacco, such as cigarettes and e-cigarettes. If you need help quitting, ask your health care provider.  Avoid secondhand smoke.  Wear compression stockings as told by your health care provider. These stockings help to prevent blood clots and reduce swelling in your legs.  If you have a chest tube, care for it as instructed by your health care provider. Do not travel by airplane during the 2 weeks after your chest tube is removed, or until your health care provider says that this is safe.  Keep all follow-up visits as told by your health care provider. This is important. Contact a health care provider if:  You have redness, swelling, or pain around an incision.  You have fluid or blood coming from an incision.  Your incision area feels warm to the touch.  You have pus or a bad smell coming from an incision.  You have a fever or chills.  You have nausea or vomiting.  You have pain that does not get better with medicine. Get help right away if:  You have chest pain.  Your heart is fluttering or beating rapidly.  You develop a rash.  You have shortness of breath or trouble breathing.  You are confused.  You have trouble speaking.  You feel weak, light-headed, or dizzy.  You faint. Summary  To help prevent lung infection (pneumonia), take deep breaths or do breathing exercises as instructed by your health care provider.  Cough frequently to clear mucus (phlegm) and expand your lungs. If it hurts to cough, hold a pillow against your chest or place the palms of both hands on top of the incision (use splinting) when you cough.  If  you have pain, take pain-relieving medicine before your pain becomes severe. This is important because if your pain is under control, you will be able to breathe and cough more comfortably.  Ask your health care provider what activities are safe for you. This information is not intended to replace advice given to you by your health care provider. Make sure you discuss any questions you have with your health care provider. Document Revised: 07/14/2017 Document Reviewed: 07/11/2016 Elsevier Patient Education  2020 [employer].

## 2019-10-29 NOTE — Progress Notes (Addendum)
      [address]       [place] [address]             [phone]      1 Day Post-Op Procedure(s) (LRB): VIDEO BRONCHOSCOPY WITH ENDOBRONCHIAL NAVIGATION (N/A) XI ROBOTIC ASSISTED THORASCOPY RIGHT UPPER LOBE LUNG WEDGE RESECTION (Right) Node Dissection Intercostal Nerve Block (Right)   Subjective:  Patient up in bed eating breakfast.  States doing okay.  Had pain most of the night, but it responded to pain medications.  Denies N/V  Objective: Vital signs in last 24 hours: Temp:  [97 F (36.1 C)-98.7 F (37.1 C)] 98 F (36.7 C) (03/16 0309) Pulse Rate:  [83-95] 84 (03/16 0309) Cardiac Rhythm: Normal sinus rhythm (03/16 0701) Resp:  [13-25] 18 (03/16 0309) BP: (100-151)/(44-80) 120/68 (03/16 0309) SpO2:  [93 %-100 %] 96 % (03/16 0309) Arterial Line BP: (104-148)/(52-84) 104/84 (03/16 0309)  Intake/Output from previous day: 03/15 0701 - 03/16 0700 In: 3373.8 [P.O.:600; I.V.:2373.8; IV Piggyback:400] Out: 2877 [Urine:2550; Blood:100; Chest Tube:227]  General appearance: alert, cooperative and no distress Heart: regular rate and rhythm Lungs: clear to auscultation bilaterally Abdomen: soft, non-tender; bowel sounds normal; no masses,  no organomegaly Extremities: extremities normal, atraumatic, no cyanosis or edema Wound: clean and dry  Lab Results: Recent Labs    10/29/19 0300  WBC 16.1*  HGB 13.8  HCT 40.9  PLT 242   BMET:  Recent Labs    10/29/19 0300  NA 137  K 4.2  CL 101  CO2 29  GLUCOSE 135*  BUN 13  CREATININE 0.78  CALCIUM 8.7*    PT/INR: No results for input(s): LABPROT, INR in the last 72 hours. ABG    Component Value Date/Time   PHART 7.380 10/29/2019 0256   HCO3 30.3 (H) 10/29/2019 0256   TCO2 35 12/14/2016 0420   O2SAT 95.3 10/29/2019 0256   CBG (last 3)  No results for input(s): GLUCAP in the last 72 hours.  Assessment/Plan: S/P Procedure(s) (LRB): VIDEO BRONCHOSCOPY WITH ENDOBRONCHIAL NAVIGATION (N/A) XI  ROBOTIC ASSISTED THORASCOPY RIGHT UPPER LOBE LUNG WEDGE RESECTION (Right) Node Dissection Intercostal Nerve Block (Right)  1. CV- hemodynamically stable in NSR 2. Pulm- CT in place with 1+ air leak with cough on water seal, CXR with trace apical space.Marland Kitchen output has been low at 227... leave chest tube in place on water seal today 3. Renal- creatinine, lytes okay.. place IV fluids to KVO 4. Lovenox for DVT prophylaxis 5. D/C Arterial line 6. Dispo- patient stable, 1+ air leak with cough on water seal, leave in place today, repeat CXR in AM   LOS: 1 day    Ellwood Handler, PA-C  10/29/2019 Patient seen and examined, agree with above Keep Ct to water seal Ambulate  Remo Lipps C. Roxan Hockey, MD [facility] [phone]

## 2019-10-29 NOTE — Op Note (Signed)
NAME: [name], [name] MEDICAL RECORD [id number] ACCOUNT [id number] DATE OF BIRTH:01-18-1941 FACILITY: [facility] LOCATION: [facility] PHYSICIAN:Rilda Bulls Chaya Jan, MD  OPERATIVE REPORT  DATE OF PROCEDURE:  10/28/2019  PREOPERATIVE DIAGNOSIS:  Right upper lobe lung nodule.  POSTOPERATIVE DIAGNOSIS:  Squamous cell carcinoma, right upper lobe, clinical stage IA (T1, N0).  PROCEDURE:   Video bronchoscopy with electromagnetic navigation for tumor marking,  Xi robotic-assisted right thoracoscopy, Right upper lobe wedge resection, Lymph node dissection, and Intercostal nerve blocks at levels 3 through 10.  SURGEON:  Modesto Charon, MD  ASSISTANT:  Lanelle Bal, MD  PHYSICIAN ASSISTANT:  Ellwood Handler, PA  ANESTHESIA:  General.  FINDINGS:  Frozen section showed squamous cell carcinoma.  Margin free of tumor.  CLINICAL NOTE:  [name] is an 79 year old man with a history of tobacco abuse, COPD, lingular sparing left upper lobectomy for a stage IB squamous cell carcinoma, as well as several other medical issues.  He has been followed with CT scans since his  lung resection in 2018.  He was found to have a new nodule in the posterior right upper lobe.  On followup, that nodule slowly increased over time.  On PET it had an SUV of 2.85.  I recommended intervention at that time, but he wanted to wait for a  followup scan.  On the followup scan, the nodule had increased in size.  He was offered the option of surgical resection versus stereotactic radiation.  After considering the advantages and disadvantages of each approach he opted for surgical resection.   Given the nodule was small and peripherally located along with his limited pulmonary reserve, it was felt the best option would be wedge resection if an adequate margin could be obtained.  The indications, risks, benefits, and alternatives of the  procedure were discussed in detail with the patient.  He accepted those risks and wished to  proceed.  OPERATIVE NOTE:  [name] was brought to the preoperative holding area on 10/28/2019.  Anesthesia placed a central venous catheter and an arterial blood pressure monitoring line.  He was taken to the operating room, anesthetized, and intubated.   Intravenous antibiotics were administered.  A Foley catheter was placed.  Sequential compression devices were placed on the calves for DVT prophylaxis.  A Bair Hugger was placed to help maintain normothermia.  A timeout was performed.  Flexible fiberoptic bronchoscopy was performed via the endotracheal tube.  The previous segmental bronchus closure was well healed.  There were no endobronchial lesions to the level of the subsegmental bronchi.  There were  minimal secretions.  Planning for the navigational bronchoscopy had been done prior to induction.  The locatable guide for navigation was placed and registration was performed.  The bronchoscope was directed to the right upper lobe bronchus and the  appropriate subsegmental bronchus was cannulated.  The catheter was advanced to within a centimeter of the lesion with good alignment.  1 mL of a 50/50 solution containing methylene blue and ICG was injected at the nodule.  The bronchoscope was withdrawn.  He  then was reintubated with a double-lumen endotracheal tube and placed in a left lateral decubitus position.  The right chest was prepped and draped in the usual sterile fashion.  A 2nd timeout was performed.  A solution containing 20 mL of liposomal bupivacaine, 30 mL of 0.5% bupivacaine, and 50 mL of saline was prepared.  This was used for local at the incision sites as well as the intercostal nerve blocks.  The sites  for the  incision were injected prior to making the incision.  An 8 mm robotic port was placed in the 9th interspace in the mid axillary line.  The thoracoscope was advanced into the chest.  Carbon dioxide was insufflated into the chest to a maximum pressure of  10 mmHg.  There was  good isolation of the right lung.  Three additional robotic ports were placed, 12 mm ports were placed 4 cm anterior and posterior to the scope port.  These ports were inserted while visualizing with the thoracoscope and another 8 mm  port was placed posteriorly.  A 12 mm assistant port was placed in the 10th interspace more anteriorly.  The intercostal nerve blocks then were performed.  A needle was inserted from a posterior approach and 10 mL of the bupivacaine solution was injected  in a subpleural plane in each interspace from the 3rd to the 10th.  The robot then was brought to the table and an arm was attached to the port for the scope.  The scope was inserted and targeting was performed.  The remaining ports were then were  docked to the robot.  Instruments were inserted while visualizing with the thoracoscope.  I then went to the console.  Dr. Servando Snare and Junie Panning Barrett remained at the patient's bedside.  Gentle upward traction was placed on the lower lobe and the inferior ligament was divided with cautery.  A lymph node was removed and sent for permanent pathology.  All the lymph nodes were sent as separate specimens.  All appeared grossly benign.  The  pleural reflection was divided posteriorly and a subcarinal node was identified and removed.  Then, working more proximally, a level 10 node was removed as well.  There were some thin adhesions between the superior segment of the lower lobe and the upper  lobe.  These were taken down with cautery.  The Firefly then was used to identify the site of the nodule that had been marked with ICG.  A wedge resection then was performed with sequential firings of the robotic stapler.  Multiple firings using both  blue and green cartridges were used.  At least a 1 cm gross margin was maintained on the nodule.  Once the wedge resection was complete, the specimen was placed into an endoscopic retrieval bag, removed and sent for frozen section on the nodule and the   stapled margin.  The upper lobe then was retracted inferiorly.  The pleura overlying the right paratracheal space was opened and a level 4 and 2R lymph nodes were removed and sent for permanent pathology.  Inspection of the port sites showed some bleeding from around the posterior robotic port.  This was controlled with bipolar cautery.  A small tear from retraction was noted along the inferior margin of the lower lobe and the stapler was used to remove  this area with single firing using a blue cartridge.  The specimen was normal in appearance.  It was not sent for pathology.  The robot was undocked and I returned to the bedside.  The chest was copiously irrigated with warm saline.  Final inspection was made with the thoracoscope and there was no ongoing bleeding.  A 28-French Blake drain was placed through the anterior 9th interspace incision and secured with #1 silk suture.   Dual-lung ventilation was resumed.  The thoracoscope was removed.  The remaining port incisions were closed in standard fashion.  Chest tube was placed to waterseal.  All sponges that had  been inserted had been removed prior to undocking the robot.  The  patient was placed back in supine position.  He then was extubated in the operating room and taken to the postanesthetic care unit in good condition.  All sponge, needle and instrument counts were correct at the end of the procedure.  CN/NUANCE  D:10/28/2019 T:10/29/2019 JOB:[id number]

## 2019-10-30 ENCOUNTER — Inpatient Hospital Stay (HOSPITAL_COMMUNITY): Payer: Medicare Other

## 2019-10-30 LAB — CBC
HCT: 40.6 % (ref 39.0–52.0)
Hemoglobin: 13 g/dL (ref 13.0–17.0)
MCH: 31.3 pg (ref 26.0–34.0)
MCHC: 32 g/dL (ref 30.0–36.0)
MCV: 97.6 fL (ref 80.0–100.0)
Platelets: 210 10*3/uL (ref 150–400)
RBC: 4.16 MIL/uL — ABNORMAL LOW (ref 4.22–5.81)
RDW: 13.7 % (ref 11.5–15.5)
WBC: 14.9 10*3/uL — ABNORMAL HIGH (ref 4.0–10.5)
nRBC: 0 % (ref 0.0–0.2)

## 2019-10-30 LAB — COMPREHENSIVE METABOLIC PANEL
ALT: 16 U/L (ref 0–44)
AST: 13 U/L — ABNORMAL LOW (ref 15–41)
Albumin: 2.8 g/dL — ABNORMAL LOW (ref 3.5–5.0)
Alkaline Phosphatase: 48 U/L (ref 38–126)
Anion gap: 8 (ref 5–15)
BUN: 17 mg/dL (ref 8–23)
CO2: 33 mmol/L — ABNORMAL HIGH (ref 22–32)
Calcium: 8.8 mg/dL — ABNORMAL LOW (ref 8.9–10.3)
Chloride: 97 mmol/L — ABNORMAL LOW (ref 98–111)
Creatinine, Ser: 0.72 mg/dL (ref 0.61–1.24)
GFR calc Af Amer: >60 mL/min (ref >60)
GFR calc non Af Amer: >60 mL/min (ref >60)
Glucose, Bld: 108 mg/dL — ABNORMAL HIGH (ref 70–99)
Potassium: 4.5 mmol/L (ref 3.5–5.1)
Sodium: 138 mmol/L (ref 135–145)
Total Bilirubin: 0.9 mg/dL (ref 0.3–1.2)
Total Protein: 5.8 g/dL — ABNORMAL LOW (ref 6.5–8.1)

## 2019-10-30 MED ORDER — SODIUM CHLORIDE 0.9% FLUSH: 10.0000 mL | INTRAVENOUS | Status: DC | PRN

## 2019-10-30 MED ORDER — SODIUM CHLORIDE 0.9% FLUSH
10.0000 mL | Freq: Two times a day (BID) | INTRAVENOUS | Status: DC
  Administered 2019-10-30 – 2019-10-31 (×3): 10 mL

## 2019-10-30 NOTE — Progress Notes (Addendum)
      [address]       [place] [address]             [phone]      2 Days Post-Op Procedure(s) (LRB): VIDEO BRONCHOSCOPY WITH ENDOBRONCHIAL NAVIGATION (N/A) XI ROBOTIC ASSISTED THORASCOPY RIGHT UPPER LOBE LUNG WEDGE RESECTION (Right) Node Dissection Intercostal Nerve Block (Right)   Subjective:  Patient feeling a bit better.  States he didn't walk at all yesterday, other than to go to the bathroom.  + flatus  Objective: Vital signs in last 24 hours: Temp:  [98 F (36.7 C)-98.6 F (37 C)] 98.3 F (36.8 C) (03/17 0351) Pulse Rate:  [75-90] 75 (03/17 0500) Cardiac Rhythm: Normal sinus rhythm;Bundle branch block (03/17 0705) Resp:  [14-22] 15 (03/17 0500) BP: (117-132)/(61-71) 132/68 (03/17 0351) SpO2:  [91 %-95 %] 95 % (03/17 0500)  Intake/Output from previous day: 03/16 0701 - 03/17 0700 In: 455 [P.O.:425; I.V.:30] Out: 940 [Urine:700; Chest Tube:240]  General appearance: alert, cooperative and no distress Heart: regular rate and rhythm Lungs: clear to auscultation bilaterally Abdomen: soft, non-tender; bowel sounds normal; no masses,  no organomegaly Extremities: extremities normal, atraumatic, no cyanosis or edema Wound: clean and dry  Lab Results: Recent Labs    10/29/19 0300 10/30/19 0521  WBC 16.1* 14.9*  HGB 13.8 13.0  HCT 40.9 40.6  PLT 242 210   BMET:  Recent Labs    10/29/19 0300 10/30/19 0521  NA 137 138  K 4.2 4.5  CL 101 97*  CO2 29 33*  GLUCOSE 135* 108*  BUN 13 17  CREATININE 0.78 0.72  CALCIUM 8.7* 8.8*    PT/INR: No results for input(s): LABPROT, INR in the last 72 hours. ABG    Component Value Date/Time   PHART 7.380 10/29/2019 0256   HCO3 30.3 (H) 10/29/2019 0256   TCO2 33 (H) 10/28/2019 1052   ACIDBASEDEF 2.0 10/28/2019 0944   O2SAT 95.3 10/29/2019 0256   CBG (last 3)  No results for input(s): GLUCAP in the last 72 hours.  Assessment/Plan: S/P Procedure(s) (LRB): VIDEO BRONCHOSCOPY WITH  ENDOBRONCHIAL NAVIGATION (N/A) XI ROBOTIC ASSISTED THORASCOPY RIGHT UPPER LOBE LUNG WEDGE RESECTION (Right) Node Dissection Intercostal Nerve Block (Right)  1. CV- NSR, BP stable 2. Pulm- no air leak present, CXR shows stable trace apical pneumothorax, worsening atelectasis on the left... encouraged patient to use IS more frequently and take good deep breaths/cough 3. D/C Foley 4. Renal- creatinine, lytes stable 5. Lovenox for DVT prophylaxis 6. Dispo- patient stable, no air leak will likely d/c chest tube today.. big problem that patient wasn't walked yesterday.  He is more than willing to participate, repeat CXR in AM   LOS: 2 days    Ellwood Handler, PA-C  10/30/2019 Patient seen and examined, agree with above Dc chest tube and central line AMBULATE Path T1N0, stage IA squamous cell carcinoma   Revonda Standard. Roxan Hockey, MD [facility] [phone]

## 2019-10-31 ENCOUNTER — Inpatient Hospital Stay (HOSPITAL_COMMUNITY): Payer: Medicare Other

## 2019-10-31 NOTE — Progress Notes (Addendum)
      [address]       [place] [address]             [phone]      3 Days Post-Op Procedure(s) (LRB): VIDEO BRONCHOSCOPY WITH ENDOBRONCHIAL NAVIGATION (N/A) XI ROBOTIC ASSISTED THORASCOPY RIGHT UPPER LOBE LUNG WEDGE RESECTION (Right) Node Dissection Intercostal Nerve Block (Right)   Subjective:  Patient feels good.  He states he walked 45 min yesterday.  He uses oxygen nightly at home.  Objective: Vital signs in last 24 hours: Temp:  [97.6 F (36.4 C)-98.4 F (36.9 C)] 98.3 F (36.8 C) (03/18 0715) Pulse Rate:  [81-100] 81 (03/18 0715) Cardiac Rhythm: Normal sinus rhythm (03/18 0702) Resp:  [16-23] 19 (03/18 0715) BP: (100-177)/(59-77) 123/61 (03/18 0715) SpO2:  [88 %-97 %] 93 % (03/18 0715)  Intake/Output from previous day: 03/17 0701 - 03/18 0700 In: 710 [P.O.:700; I.V.:10] Out: 385 [Urine:385]  General appearance: alert, cooperative and no distress Heart: regular rate and rhythm Lungs: clear to auscultation bilaterally Abdomen: soft, non-tender; bowel sounds normal; no masses,  no organomegaly Extremities: extremities normal, atraumatic, no cyanosis or edema Wound: clean and dry  Lab Results: Recent Labs    10/29/19 0300 10/30/19 0521  WBC 16.1* 14.9*  HGB 13.8 13.0  HCT 40.9 40.6  PLT 242 210   BMET:  Recent Labs    10/29/19 0300 10/30/19 0521  NA 137 138  K 4.2 4.5  CL 101 97*  CO2 29 33*  GLUCOSE 135* 108*  BUN 13 17  CREATININE 0.78 0.72  CALCIUM 8.7* 8.8*    PT/INR: No results for input(s): LABPROT, INR in the last 72 hours. ABG    Component Value Date/Time   PHART 7.380 10/29/2019 0256   HCO3 30.3 (H) 10/29/2019 0256   TCO2 33 (H) 10/28/2019 1052   ACIDBASEDEF 2.0 10/28/2019 0944   O2SAT 95.3 10/29/2019 0256   CBG (last 3)  No results for input(s): GLUCAP in the last 72 hours.  Assessment/Plan: S/P Procedure(s) (LRB): VIDEO BRONCHOSCOPY WITH ENDOBRONCHIAL NAVIGATION (N/A) XI ROBOTIC ASSISTED THORASCOPY  RIGHT UPPER LOBE LUNG WEDGE RESECTION (Right) Node Dissection Intercostal Nerve Block (Right)  1. CV- NSR, BP 2. Pulm- no air leak present, CXR shows no evidence of pneumothorax, atelectasis remains stable, he will require continuous oxygen initially can wean at home 3. Lovenox for DVT prophylaxis 4. Dispo- patient stable, CXR looks good, will require oxygen at discharge for continuous use initially, he used nightly prior to admission, orders placed.. possibly home today vs tomorrow.   LOS: 3 days    Ellwood Handler, PA-C  10/31/2019 Patient seen and examined, agree with above Anxious to go home Will see how he does this AM, home if able to stay on 2L [place]  [place]. Roxan Hockey, MD [facility] [phone]

## 2019-11-01 MED ORDER — OXYCODONE HCL 5 MG PO TABS: 5.0000 mg | ORAL_TABLET | ORAL | 0 refills | Status: DC | PRN

## 2019-11-01 NOTE — Progress Notes (Addendum)
SATURATION QUALIFICATIONS: (This note is used to comply with regulatory documentation for home oxygen)  Patient Saturations on [place] 3LPM at Rest = 95%  Patient Saturations on Room Air while Ambulating = 82%  Patient Saturations on 3 Liters of oxygen while Ambulating = 88%  Please briefly explain why patient needs home oxygen:  Prior to admit pt wears home oxygen at night. Has oxygen condenser at home and small portable tank. Will need 3 LPM continuously upon DC to maintain saturation above 88%. Paged LSW/NCM for oxygen needs.

## 2019-11-01 NOTE — Progress Notes (Signed)
Patient was given discharge education and the patient verbalized understanding. Patient's belongings were returned back to the patient. Patient's vital signs were stable upon discharge. Patient's IV was removed and was intact when removing. The NT wheeled down the patient in a wheelchair.   Warren Danes, RN 11/01/2019 11:41 AM

## 2019-11-01 NOTE — TOC Progression Note (Signed)
Transition of Care [facility]) - Progression Note    Patient Details  Name: [name] MRN: [id number] Date of Birth: 02/28/1941  Transition of [facility]) CM/SW Contact  Zenon Mayo, RN Phone Number: 11/01/2019, 9:17 AM  Clinical Narrative:    Patient is for dc today, he has home oxygen with [employer], NCM contacted [employer] and he is already on continous oxygen for 2 liters with them.  NCM informed Tiffany that he is now on 3 liters , so oxygen order has changed, she states they can get into epic to retrieve  The orders and the saturations.  NCM informed her patient is for dc home today, she states they will contact patient to make sure he has enough tanks, he has a concentrator at home.  Patient states he also has a portable that he will have at dc.  He also has a walker at home.  He has transportation at [employer].     Barriers to Discharge: No Barriers Identified  Expected Discharge Plan and Services           Expected Discharge Date: 11/01/19                                     Social Determinants of Health (SDOH) Interventions    Readmission Risk Interventions No flowsheet data found.

## 2019-11-01 NOTE — Progress Notes (Addendum)
      [address]       [place] [address]             [phone]       4 Days Post-Op Procedure(s) (LRB): VIDEO BRONCHOSCOPY WITH ENDOBRONCHIAL NAVIGATION (N/A) XI ROBOTIC ASSISTED THORASCOPY RIGHT UPPER LOBE LUNG WEDGE RESECTION (Right) Node Dissection Intercostal Nerve Block (Right)   Subjective:  No new complaints.  Feels like is breathing a little better.  Has been able to cough up sputum, states not very difficult.    Objective: Vital signs in last 24 hours: Temp:  [97.6 F (36.4 C)-98.8 F (37.1 C)] 98.2 F (36.8 C) (03/19 0345) Pulse Rate:  [80-90] 80 (03/19 0345) Cardiac Rhythm: Normal sinus rhythm (03/19 0701) Resp:  [17-20] 17 (03/19 0345) BP: (116-129)/(61-77) 129/77 (03/19 0345) SpO2:  [92 %-100 %] 98 % (03/19 0345)  Intake/Output from previous day: 03/18 0701 - 03/19 0700 In: 960 [P.O.:950; I.V.:10] Out: 900 [Urine:900]  General appearance: alert, cooperative and no distress Heart: regular rate and rhythm Lungs: coarse throughout, clears with coughing Abdomen: soft, non-tender; bowel sounds normal; no masses,  no organomegaly Extremities: extremities normal, atraumatic, no cyanosis or edema Wound: clean and dry  Lab Results: Recent Labs    10/30/19 0521  WBC 14.9*  HGB 13.0  HCT 40.6  PLT 210   BMET:  Recent Labs    10/30/19 0521  NA 138  K 4.5  CL 97*  CO2 33*  GLUCOSE 108*  BUN 17  CREATININE 0.72  CALCIUM 8.8*    PT/INR: No results for input(s): LABPROT, INR in the last 72 hours. ABG    Component Value Date/Time   PHART 7.380 10/29/2019 0256   HCO3 30.3 (H) 10/29/2019 0256   TCO2 33 (H) 10/28/2019 1052   ACIDBASEDEF 2.0 10/28/2019 0944   O2SAT 95.3 10/29/2019 0256   CBG (last 3)  No results for input(s): GLUCAP in the last 72 hours.  Assessment/Plan: S/P Procedure(s) (LRB): VIDEO BRONCHOSCOPY WITH ENDOBRONCHIAL NAVIGATION (N/A) XI ROBOTIC ASSISTED THORASCOPY RIGHT UPPER LOBE LUNG WEDGE RESECTION  (Right) Node Dissection Intercostal Nerve Block (Right)  1. CV- NSR, BP stable 2. Pulm- CT is out, added flutter valve yesterday, patient is not coughing up sputum without difficulty, feels like he is breathing better. 3. Lovenox for DVT prophylaxis 4. Dispo- patient stable, nursing will walk this morning, as long as oxygen demands dont' go above 3 L will d/c home today   LOS: 4 days    Ellwood Handler, PA-C  11/01/2019 patient seen and examined, agree with above Home with O2 today  Remo Lipps C. Roxan Hockey, MD [facility] [phone]

## 2019-11-01 NOTE — Care Management Important Message (Signed)
Important Message  Patient Details  Name: [name] MRN: [id number] Date of Birth: Apr 24, 1941   Medicare Important Message Given:  Yes     [name] 11/01/2019, 1:11 PM

## 2019-11-01 NOTE — Progress Notes (Signed)
Chaplain engaged in initial visit with [name].  He expressed that he has been well taken care of by the staff on [place].  He also noted what it felt like to be a patient.  He found joy in just being able to sit up and being able to turn on his left side.  He found it difficult to lay in the bed all day and described that as feeling like he was going to "crawl up the wall."  Chaplain uplifted [name] description of his time in the hospital and was grateful to hear his perspective of what it means to be a patient.  Chaplain offered a blessing over [name] journey out of the hospital as his discharges today.  Chaplain served as compassionate presence, offering the ministry of listening.

## 2019-11-07 ENCOUNTER — Other Ambulatory Visit: Payer: Self-pay | Admitting: *Deleted

## 2019-11-07 DIAGNOSIS — C3490 Malignant neoplasm of unspecified part of unspecified bronchus or lung: Secondary | ICD-10-CM | POA: Diagnosis not present

## 2019-11-07 DIAGNOSIS — R0901 Asphyxia: Secondary | ICD-10-CM | POA: Diagnosis not present

## 2019-11-07 DIAGNOSIS — N3281 Overactive bladder: Secondary | ICD-10-CM | POA: Diagnosis not present

## 2019-11-07 DIAGNOSIS — F329 Major depressive disorder, single episode, unspecified: Secondary | ICD-10-CM | POA: Diagnosis not present

## 2019-11-07 DIAGNOSIS — J449 Chronic obstructive pulmonary disease, unspecified: Secondary | ICD-10-CM | POA: Diagnosis not present

## 2019-11-07 DIAGNOSIS — E669 Obesity, unspecified: Secondary | ICD-10-CM | POA: Diagnosis not present

## 2019-11-07 DIAGNOSIS — G4733 Obstructive sleep apnea (adult) (pediatric): Secondary | ICD-10-CM | POA: Diagnosis not present

## 2019-11-07 DIAGNOSIS — I10 Essential (primary) hypertension: Secondary | ICD-10-CM | POA: Diagnosis not present

## 2019-11-07 NOTE — Progress Notes (Signed)
The proposed treatment discussed in cancer conference 11/07/19 is for discussion purpose only and is not a binding recommendation.  The patient was not physically examined nor present for their treatment options.  Therefore, final treatment plans cannot be decided.

## 2019-11-11 ENCOUNTER — Other Ambulatory Visit: Payer: Self-pay | Admitting: Thoracic Surgery (Cardiothoracic Vascular Surgery)

## 2019-11-11 DIAGNOSIS — C3490 Malignant neoplasm of unspecified part of unspecified bronchus or lung: Secondary | ICD-10-CM

## 2019-11-12 ENCOUNTER — Other Ambulatory Visit: Payer: Self-pay | Admitting: *Deleted

## 2019-11-12 ENCOUNTER — Encounter: Payer: Self-pay | Admitting: Thoracic Surgery (Cardiothoracic Vascular Surgery)

## 2019-11-12 ENCOUNTER — Ambulatory Visit
Admission: RE | Admit: 2019-11-12 | Discharge: 2019-11-12 | Disposition: A | Discharge status: Home / Self Care | Payer: Medicare Other | Source: Ambulatory Visit | Attending: Thoracic Surgery (Cardiothoracic Vascular Surgery) | Admitting: Thoracic Surgery (Cardiothoracic Vascular Surgery)

## 2019-11-12 ENCOUNTER — Ambulatory Visit (INDEPENDENT_AMBULATORY_CARE_PROVIDER_SITE_OTHER): Payer: Self-pay | Admitting: Thoracic Surgery (Cardiothoracic Vascular Surgery)

## 2019-11-12 ENCOUNTER — Other Ambulatory Visit: Payer: Self-pay

## 2019-11-12 VITALS — BP 160/89 | HR 95 | Temp 97.7°F | Resp 20 | Ht 66.0 in | Wt 191.0 lb

## 2019-11-12 DIAGNOSIS — C3491 Malignant neoplasm of unspecified part of right bronchus or lung: Secondary | ICD-10-CM

## 2019-11-12 DIAGNOSIS — R0602 Shortness of breath: Secondary | ICD-10-CM | POA: Diagnosis not present

## 2019-11-12 DIAGNOSIS — C3490 Malignant neoplasm of unspecified part of unspecified bronchus or lung: Secondary | ICD-10-CM

## 2019-11-12 MED ORDER — POTASSIUM CHLORIDE CRYS ER 20 MEQ PO TBCR: 20.0000 meq | EXTENDED_RELEASE_TABLET | Freq: Every day | ORAL | 0 refills | Status: DC

## 2019-11-12 MED ORDER — FUROSEMIDE 40 MG PO TABS: 40.0000 mg | ORAL_TABLET | Freq: Every day | ORAL | 0 refills | Status: DC

## 2019-11-12 NOTE — Progress Notes (Signed)
Incisions to R back are well approximated w/ surgical glue and without s/s of infection. One suture to R lateral chest is in place; incision is well approximated w/ mild erythema, no drainage or swelling. Suture easily removed per standard protocol. Instructed pt to keep incisions clean (using mild soap and water) and dry and to report s/s of infection.

## 2019-11-12 NOTE — Progress Notes (Signed)
[address]       [place] [address]             [phone]     HPI: [name] returns for scheduled postoperative follow-up visit  [name] is an 79 year old man with a history of tobacco abuse, COPD, lingular sparing left upper lobectomy for stage Ib squamous cell carcinoma in 2018, obesity, sleep apnea, arthritis, anxiety, and depression.  He had a remote history of heavy tobacco abuse.  He quit in 2018 prior to his lung resection.  After his lung resection in 2018 he was followed with serial CT scans.  He developed a posterior right upper lobe nodule that was slowly increasing in size.  It was mildly hypermetabolic on PET/CT.  Repeat scan in February showed a further increase in size.  We discussed wedge resection versus stereotactic radiation.  He opted for wedge resection.  I did a robotic wedge resection and node sampling on 10/28/2019.  He had some atelectasis initially.  He was discharged home on day 4 with oxygen.  He says that he was feeling very short of breath with even minimal activity.  He thinks that has improved significantly over the past couple of days.  He does have some incisional pain.  He has used oxycodone twice in the past for 5 days.  Past Medical History:  Diagnosis Date  . Anxiety    pt. reports that he " could swing from the ......" because he is so anxious about the surgery   . Arthritis    R hand tendonitis   . COPD (chronic obstructive pulmonary disease) ([place])   . Depression   . Dyspnea   . History of kidney stones   . Hyperlipidemia   . lung ca dx'd 10/2016  . Pneumonia   . Sleep apnea    cannot tolerate cpap    Current Outpatient Medications  Medication Sig Dispense Refill  . acetaminophen (TYLENOL) 325 MG tablet Take 2 tablets (650 mg total) by mouth every 6 (six) hours as needed for mild pain (or Fever >/= 101).    Marland Kitchen albuterol (PROVENTIL) (2.5 MG/3ML) 0.083% nebulizer solution Take 3 mLs (2.5 mg total) by  nebulization every 2 (two) hours as needed for wheezing. 75 mL 3  . budesonide-formoterol (SYMBICORT) 160-4.5 MCG/ACT inhaler Inhale 2 puffs into the lungs 2 (two) times daily.    . fexofenadine (ALLEGRA) 180 MG tablet Take 180 mg by mouth daily as needed for allergies or rhinitis.    Marland Kitchen oxyCODONE (OXY IR/ROXICODONE) 5 MG immediate release tablet Take 1-2 tablets (5-10 mg total) by mouth every 4 (four) hours as needed for moderate pain. 30 tablet 0  . sertraline (ZOLOFT) 100 MG tablet Take 100 mg by mouth daily.    . simvastatin (ZOCOR) 20 MG tablet Take 20 mg by mouth every evening.    . traZODone (DESYREL) 50 MG tablet Take 50 mg by mouth at bedtime.   1  . furosemide (LASIX) 40 MG tablet Take 1 tablet (40 mg total) by mouth daily. 14 tablet 0  . potassium chloride SA (KLOR-CON) 20 MEQ tablet Take 1 tablet (20 mEq total) by mouth daily. 14 tablet 0   No current facility-administered medications for this visit.    Physical Exam BP (!) 160/89 (BP Location: Left Arm, Patient Position: Sitting, Cuff Size: Normal)   Pulse 95   Temp 97.7 F (36.5 C) (Temporal)   Resp 20   Ht 5\' 6"  (1.676  m)   Wt 191 lb (86.6 kg)   SpO2 96% Comment: on O2 @ 3 LPM via nasal cannula  BMI 30.40 kg/m  79 year old man in no acute distress Alert and oriented x3 with no focal deficits Incisions well-healed Lungs diminished breath sounds bilaterally, no wheezing Cardiac regular rate and rhythm  Diagnostic Tests: CHEST - 2 VIEW  COMPARISON:  Radiographs 10/31/2019 and 10/30/2019. CT 09/25/2019.  FINDINGS: The heart size and mediastinal contours are stable with chronic postsurgical volume loss in the left upper hemithorax. Suture line is noted in the right upper lobe with improved surrounding lung aeration. There is persistent vascular congestion with mild bibasilar atelectasis and small bilateral pleural effusions. No pneumothorax.  IMPRESSION: Improved aeration of the right upper lobe. Persistent  vascular congestion, bibasilar atelectasis and small bilateral pleural effusions.   Electronically Signed   By: Richardean Sale M.D.   On: 11/12/2019 15:44 I personally reviewed the chest x-ray images and concur with the findings noted above  Impression: [name] is a 79 year old man with a history of tobacco abuse, COPD, lung cancer, who underwent a wedge resection for a stage Ia squamous cell carcinoma of the right upper lobe on 10/28/2019.  His early postoperative course was uncomplicated and he went home on day 4.  He did require oxygen at discharge.  Overall I think he is doing about as expected.  He had pretty severe COPD.  He had a wedge resection so his respiratory status should return to normal.  In the short-term he is having a little more shortness of breath.  He does have some pulmonary vascular congestion, so I am going to give him Lasix 40 mg a day for a couple of weeks to see if that helps any.  He will continue to use the oxygen as needed.  Pain is well controlled.  He asked about driving.  I think he should hold off until he sees me back and is feeling a little better before he tries to drive.  Plan: Lasix 40 mg daily and K-Dur 20 milliequivalents daily for 2 weeks Return in 2 weeks with PA and lateral chest x-ray  [name], MD [facility] [phone]

## 2019-11-12 NOTE — Patient Outreach (Signed)
[facility]) Care Management  11/12/2019  [name] [birth year] [id number]   EMMI-general discharge RED ON EMMI ALERT Day # 4 Date:  Sunday 11/10/19 [place] Reason: Sad/hopeless/anxious/empty? Yes   Insurance: Next[employer] Medicare  [facility] admissions x 1 ED visits x1 in the last 6 months    Outreach attempt #1 unsuccessful His grand daughter [name] answered. [facility] RN CM left HIPAA [facility] Portability and Accountability Act) compliant voicemail message along with CM's contact info.   Plan: [facility] RN CM sent an unsuccessful outreach letter and scheduled this patient for another call attempt within 4 business days   Shineka Auble L. Lavina Hamman, RN, BSN, [place] Coordinator Office number [phone] Mobile number [phone]  Main [facility] number [phone] Fax number [phone]

## 2019-11-13 ENCOUNTER — Other Ambulatory Visit: Payer: Self-pay | Admitting: *Deleted

## 2019-11-13 NOTE — Patient Outreach (Signed)
[facility]) Care Management  11/13/2019  EREZ MCCALLUM 01-Oct-1940 [id number]   EMMI-general discharge On APL RED ON EMMI ALERT Day # 4 Date:  Sunday 11/10/19 [place] Reason: Sad/hopeless/anxious/empty? Yes   Insurance: NextGen Medicare  [facility] admissions x 1 ED visits x 0 in the last 6 months  Admission 10/28/19 for robotic assisted thorascopy wedge resection surgery for right upper Lobe nodule   Mr Whetstine returned a call to [facility] RN CM Patient is able to verify HIPAA ([place] and Accountability Act) identifiers, date of birth (DOB) and address Reviewed and addressed referral to [facility] with patient   EMMI  Mr Standre reports the EMMI is correct but he is better now. He reports his use of narcotics like oxycodone " plays tricks on me" He reports he is better than during the weekend. He has a history of depression and is taking Zoloft He denies need of [facility] SW services when discussed and offered   Incision sites to his right back is without signs of infection  Transition of care services noted to be completed by primary care MD office staff- [facility] Dr Dagmar Hait  Transition of Care will be completed by primary care provider office who will refer to [facility] care management if needed..  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 peripheral vascular disease (PVD), arthritis, anxiety, depression (on Zoloft), history (hx) of lung resection in 2018, hx of kidney stones, Hyperlipidemia (HLD), asthma, allergic rhinitis  DME Oxygen ([employer]) 3 L concentrator  walker  Plan: [facility] RN CM scheduled this patient for another call attempt within 7-10 business days Pt encouraged to return a call to [facility] RN CM prn  [facility] RN CM sent a Gaffer with [facility] brochure, Magnet, [facility] consent form with return envelope and know before you go sheet enclosed for review  MD barriers involvement letter sent   Routed note to MDs/NP/PA   [facility] CM Care Plan Problem One     Most Recent Value  Care Plan Problem One  knowledge deficit of home care for COPD, HTN, s/p lung cancer  Role Documenting the Problem One  Care Management Telephonic Coordinator  Care Plan for Problem One  Active  [facility] Long Term Goal   over the next 60 days the patient will be able to verbalize action plans manage HTN, COPD and post cancer symptoms as verbalized during future outreach  [facility] Long Term Goal Start Date  11/13/19  Interventions for Problem One Long Term Goal  EMMI referral assessment, [facility] SW services discussed, assessed for worsening symptoms after lung ressection, answered questions  [facility] CM Short Term Goal #1   over then next 30 days the patient will be able to verbalize 2-3 worsening symptoms to report to MD as vebalized during future outreach  [facility] CM Short Term Goal #1 Start Date  11/13/19  Interventions for Short Term Goal #1  EMMI referral assessment, [facility] SW services discussed, assessed for worsening symptoms after lung ressection, answered questions       Cassandre Oleksy L. Lavina Hamman, RN, BSN, [place] Coordinator Office number [phone] Mobile  number [phone]  Main [facility] number [phone] Fax number [phone]

## 2019-11-14 ENCOUNTER — Ambulatory Visit: Payer: Medicare Other | Admitting: *Deleted

## 2019-11-19 ENCOUNTER — Encounter: Payer: Self-pay | Admitting: *Deleted

## 2019-11-21 ENCOUNTER — Other Ambulatory Visit: Payer: Self-pay

## 2019-11-21 ENCOUNTER — Other Ambulatory Visit: Payer: Self-pay | Admitting: *Deleted

## 2019-11-21 ENCOUNTER — Encounter: Payer: Self-pay | Admitting: *Deleted

## 2019-11-21 NOTE — Patient Outreach (Signed)
[facility]) Care Management  11/21/2019  [name] 1941-07-31 [id number]   EMMI-general discharge On APL RED ON EMMI ALERT Day #4 Date:Sunday 11/10/19 [place] Reason:Sad/hopeless/anxious/empty? Yes   Insurance:NextGen Medicare  [facility] admissions x1ED visits x 0in the last 6 months  Admission 10/28/19 for robotic assisted thorascopy wedge resection surgery for right upper Lobe nodule   Mr [name] returned a call to [facility] RN CM Patient is able to verify HIPAA ([place] and Accountability Act) identifiers, date of birth (DOB) and address Reviewed and addressed referral to [facility]) with patient   EMMI  Mr [name] reports the EMMI is correct but he is better now. He reports his use of narcotics like oxycodone " plays tricks on me" He reports he has not been taking the oxycodone lately He feels that he feels sad at time related to his medical oncology history. He reviews his oncology history with [facility] RN CM.  He has a history of depression and "panic attack" and is taking Zoloft He denies need of [facility] SW services when discussed and offered   Incision sites remain sore  to his right side and stomach  Transition of care services noted to be completed by primary care MD office staff- [facility] Dr Dagmar Hait  Transition of Care will be completed by primary care provider office who will refer to [facility] care management if needed.Marland Kitchen  Update  Mr [name] reports getting better day by day. Today he states he is "a quarter better"   He received his [facility] calendar, letter and information today and has just begun to read it.   Home monitoring He reports a blood pressure (BP) of 137/79 heart rate of 80 and a wt of 191 lbs loss 7 lbs 198 down to 191   Medications side effects reports nausea and loss of taste from potassium. 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 peripheral vascular disease (PVD), arthritis, anxiety, depression (on Zoloft), history (hx) of lung resection in 2018, hx of kidney stones, Hyperlipidemia (HLD), asthma, allergic rhinitis  DME Oxygen ([facility]) 3 L concentrator walker BP cuff   Plan: [facility] RN CM scheduled this patient for another call attempt within 7-10 business days Pt encouraged to return a call to [facility] RN CM prn  [facility] RN CM sent a Gaffer with [facility] brochure, [employer], [facility] consent form with return envelope and know before you go sheet enclosed for review  MD barriers involvement letter sent   Routed note to MDs/NP/PA [facility] CM Care Plan Problem One     Most Recent Value  Care Plan Problem One  knowledge deficit of home care for COPD, HTN, s/p lung cancer  Role Documenting the Problem One  Care  Management Telephonic Coordinator  Care Plan for Problem One  Active  [facility] Long Term Goal   over the next 60 days the patient will be able to verbalize action plans manage HTN, COPD and post cancer symptoms as verbalized during future outreach  [facility] Long Term Goal Start Date  11/13/19  Interventions for Problem One Long Term Goal  assessed worsening symptoms of HTN, Cancer, surgery site,discussed high potassium diet when not bein able to  tolerate potassium , sent EMMI for coping with a health condition, high potassium diet and dealing with nausea and vomiting form the drugs you take  Via his listed e-mail address in EPIC  [facility] CM Short Term Goal #1   over then next 30 days the patient will be able to verbalize 2-3 worsening symptoms to report to MD as vebalized during future outreach  [facility] CM Short Term Goal #1 Start Date  11/13/19  Interventions for Short Term Goal #1  assessed worsening symptoms including nausea to report to MD     Joelene Millin L. Lavina Hamman, RN, BSN, [place] Coordinator Office number [phone] Mobile number [phone]  Main [facility] number [phone] Fax number [phone]

## 2019-11-25 ENCOUNTER — Other Ambulatory Visit: Payer: Self-pay | Admitting: Thoracic Surgery (Cardiothoracic Vascular Surgery)

## 2019-11-25 DIAGNOSIS — R911 Solitary pulmonary nodule: Secondary | ICD-10-CM

## 2019-11-26 ENCOUNTER — Ambulatory Visit (INDEPENDENT_AMBULATORY_CARE_PROVIDER_SITE_OTHER): Payer: Self-pay | Admitting: Thoracic Surgery (Cardiothoracic Vascular Surgery)

## 2019-11-26 ENCOUNTER — Other Ambulatory Visit: Payer: Self-pay

## 2019-11-26 ENCOUNTER — Encounter: Payer: Self-pay | Admitting: Thoracic Surgery (Cardiothoracic Vascular Surgery)

## 2019-11-26 ENCOUNTER — Ambulatory Visit
Admission: RE | Admit: 2019-11-26 | Discharge: 2019-11-26 | Disposition: A | Discharge status: Home / Self Care | Payer: Medicare Other | Source: Ambulatory Visit | Attending: Thoracic Surgery (Cardiothoracic Vascular Surgery) | Admitting: Thoracic Surgery (Cardiothoracic Vascular Surgery)

## 2019-11-26 VITALS — BP 132/73 | HR 102 | Temp 97.3°F | Resp 18 | Ht 66.0 in | Wt 188.0 lb

## 2019-11-26 DIAGNOSIS — R911 Solitary pulmonary nodule: Secondary | ICD-10-CM

## 2019-11-26 DIAGNOSIS — C3432 Malignant neoplasm of lower lobe, left bronchus or lung: Secondary | ICD-10-CM

## 2019-11-26 DIAGNOSIS — R0602 Shortness of breath: Secondary | ICD-10-CM | POA: Diagnosis not present

## 2019-11-26 DIAGNOSIS — Z902 Acquired absence of lung [part of]: Secondary | ICD-10-CM

## 2019-11-26 MED ORDER — PREDNISONE 10 MG PO TABS: 10.0000 mg | ORAL_TABLET | Freq: Every day | ORAL | 1 refills | Status: DC

## 2019-11-26 NOTE — Progress Notes (Signed)
[address]       [place] [address]             [phone]     HPI: Mr. Escamilla returns for scheduled follow-up visit  Orrie Schubert is an 79 year old man with a history of tobacco abuse, COPD, lingular sparing left upper lobectomy for stage Ib squamous cell carcinoma, obesity, sleep apnea, arthritis, anxiety, and depression.  He had a right upper lobe wedge resection and node sampling for an enlarging nodule on 10/28/2019.  He went home on postoperative day #4 with oxygen.  I saw him in the office on 11/12/2019.  He was short of breath even with minimal activity.  He was rarely using oxycodone.  He continues to have some mild tingling and fullness in the right upper quadrant.  He is not taking any medication for that.  He is using his inhaler about twice a day.  He says he stopped using oxygen at home even when he is walking around but is still using it when he goes out.  He took Lasix and potassium for 4 days and then stopped due to nausea from the potassium.  Past Medical History:  Diagnosis Date  . Anxiety    pt. reports that he " could swing from the ......" because he is so anxious about the surgery   . Arthritis    R hand tendonitis   . COPD (chronic obstructive pulmonary disease) ([place])   . Depression   . Dyspnea   . History of kidney stones   . Hyperlipidemia   . lung ca dx'd 10/2016  . Pneumonia   . Sleep apnea    cannot tolerate cpap    Current Outpatient Medications  Medication Sig Dispense Refill  . acetaminophen (TYLENOL) 325 MG tablet Take 2 tablets (650 mg total) by mouth every 6 (six) hours as needed for mild pain (or Fever >/= 101).    Marland Kitchen albuterol (PROVENTIL) (2.5 MG/3ML) 0.083% nebulizer solution Take 3 mLs (2.5 mg total) by nebulization every 2 (two) hours as needed for wheezing. 75 mL 3  . budesonide-formoterol (SYMBICORT) 160-4.5 MCG/ACT inhaler Inhale 2 puffs into the lungs 2 (two) times daily.    . fexofenadine (ALLEGRA) 180 MG tablet  Take 180 mg by mouth daily as needed for allergies or rhinitis.    Marland Kitchen sertraline (ZOLOFT) 100 MG tablet Take 100 mg by mouth daily.    . simvastatin (ZOCOR) 20 MG tablet Take 20 mg by mouth every evening.    . traZODone (DESYREL) 50 MG tablet Take 50 mg by mouth at bedtime.   1  . furosemide (LASIX) 40 MG tablet Take 1 tablet (40 mg total) by mouth daily. (Patient not taking: Reported on 11/26/2019) 14 tablet 0  . potassium chloride SA (KLOR-CON) 20 MEQ tablet Take 1 tablet (20 mEq total) by mouth daily. (Patient not taking: Reported on 11/26/2019) 14 tablet 0  . predniSONE (DELTASONE) 10 MG tablet Take 1 tablet (10 mg total) by mouth daily with breakfast. 14 tablet 1   No current facility-administered medications for this visit.    Physical Exam BP 132/73 (BP Location: Right Arm, Patient Position: Sitting, Cuff Size: Normal)   Pulse (!) 102   Temp (!) 97.3 F (36.3 C)   Resp 18   Ht 5\' 6"  (1.676 m)   Wt 188 lb (85.3 kg)   SpO2 92% Comment: on 3L O2  BMI 30.34 kg/m   Diagnostic Tests: CHEST - 2  VIEW  COMPARISON:  November 12, 2019  FINDINGS: There is ill-defined opacity in the right mid lung and right base regions. Equivocal right pleural effusion. There is mild left base atelectasis. Left lung otherwise clear. Heart size and pulmonary vascularity are normal. There is postoperative change on the left. There is aortic atherosclerosis. No adenopathy. No bone lesions.  IMPRESSION: Areas of ill-defined opacity on the right consistent with multifocal pneumonia. Equivocal right pleural effusion. Slight left base atelectasis. Postoperative change on the left.  Cardiac silhouette normal.  Aortic Atherosclerosis (ICD10-I70.0).   Electronically Signed   By: Lowella Grip III M.D.   On: 11/26/2019 11:40 I personally reviewed the chest x-ray images and concur with the findings noted above  Impression: Antaeus Karel is an 79 year old man with a history of tobacco abuse, COPD,  obesity, sleep apnea, arthritis, anxiety, and depression.  He had a lingular segmentectomy for lung cancer in 2018.  He then had a right upper lobe nodule increased in size and had a wedge resection and node sampling for that about a month ago.  His postoperative course was uncomplicated but he did go home on oxygen.  He looks better today than he did 2 weeks ago.  He is still using oxygen but is not requiring it all the time.  He is not really using it at home much anymore.  He still has some incisional discomfort but is not taking any narcotics.  His chest x-ray shows some patchy ill-defined opacities.  He does not have any fever or chills to suggest bacterial infection.  We will give him prednisone for a couple of weeks to see if that helps any.  I will write for 1 refill so if it is helping he can do 1 more course of that and then if it is not helping he does not have to refill it.  I did advise him to go ahead and take the remainder of his Lasix, but not to use the potassium.  He should eat a banana or tomatoes to help with his potassium.  He does plan to start driving.  I think he is fine to do that on a limited basis around town.  He has an appointment with Dr. Julien Nordmann in June.  Plan: Finish remaining 10 days of Lasix Prednisone 10 mg p.o. daily for 14 days, 1 refill Wean oxygen as tolerated Return in 1 month with PA and lateral chest x-ray  [name], MD [facility] [phone]

## 2019-12-05 ENCOUNTER — Ambulatory Visit: Payer: Self-pay | Admitting: *Deleted

## 2019-12-06 ENCOUNTER — Other Ambulatory Visit: Payer: Self-pay

## 2019-12-06 ENCOUNTER — Other Ambulatory Visit: Payer: Self-pay | Admitting: *Deleted

## 2019-12-06 NOTE — Patient Outreach (Signed)
[place] [facility]) Care Management  12/06/2019  AHMANI PREHN [birth year] [id number]   [facility] outreach to complex care patient (previous EMMI referred patient)  On APL   Date:Sunday 11/10/19 1602Red Alert Reason:Sad/hopeless/anxious/empty? Yes EMMI red alert resolved   Insurance:NextGen Medicare  [facility] admissions x1ED visits x0in the last 6 months Admission 10/28/19 for robotic assisted thorascopy wedge resection surgery for right upper Lobe nodule  Transition of care services noted to be completed by primary care MD office staff- [facility] Dr Dagmar Hait  Transition of Care will be completed by primary care provider office who will refer to [facility] care management if needed..   Successful outreach to Mr Debois Patient is able to verify HIPAA ([place] and Accountability Act) identifiers, date of birth (DOB) and address Reviewed purpose of the call with patient- check on EMMI red alert and progress to complex care services  Mr Fabela reports he continues to get better day by day  He reports now noted gradual respiratory improvements   Oxygen use Still gets a little "winded",shortness of breath (sob) when walking longer distances outside of the home. He is still checking his oxygen saturation and rests and puts on his oxygen in the home if it goes below 90% Today 12/06/19 he walked the [employer] store and reports doing well until he was about to leave He then had to sit down, rest and use oxygen.  He always takes his oxygen with him if he leaves his home   He is noted with vocal congestion with increased conversation today. He confirms he gets hoarse "when I talk for long periods then it clears up" He is noted to clear his throat throughout the outreach with success.  He confirms he does have a productive cough of thick white to brown secretions at times He denies fever, pain or blood tinge secretions. He is confirms he is completing his  prednisone  [facility] RN CM discussed with him medications that help decrease secretions (scopolamine, mucinex,other allergy medications) and referred him to his MD He states he takes Human resources officer which works for him better than Claritin or zyrtec  He reports he gets frustrated at times but not all the time. Mrs Wehmeyer agrees but reports it is better Incision site continues to heal  He now continues to take in banana and apples to get his potassium intake He is no longer taking potassium tablet and had discussed this with his MD since the last [facility] RN CM outreach [facility] RN CM encouraged him to review his e mails for [facility] education information sent to him to include EMMI for coping with a health condition, hypertension, high potassium diet and dealing with nausea and vomiting form the drugs you take   He states he will have Mrs Taney to assist him to review them.   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, hypoxia, tobacco abuse,Chronic obstructive pulmonary disease (COPD), sleep apnea,Hypertension (HTN).peripheral vascular disease (PVD), arthritis, anxiety, depression (on Zoloft), history (hx) of lung resection in 2018, hx of kidney stones,Hyperlipidemia (HLD), asthma, allergic rhinitis  DMEOxygen ([employer]) 3 L concentrator walker BP cuff  Plan: [facility] RN CM scheduled this patient for another call attempt within30-35business days as agreed upon  Pt encouraged to return a call to [facility] RN CM prn Routed note to MD  [facility] CM Care Plan Problem One     Most Recent Value  Care Plan Problem One  knowledge deficit of home care for COPD, HTN, s/p lung cancer   Role Documenting the Problem One  Care Management Telephonic Coordinator  Care Plan for Problem One  Active  [facility] Long Term Goal   over the next 60 days the patient will be able to verbalize action plans manage HTN, COPD and post cancer symptoms as verbalized during future outreach  [facility] Long Term Goal Start Date  11/13/19  Interventions for Problem One Long Term Goal  assessed for worsening symptoms, discussed secretions and medications for secretions referred to MD to further discuss  [facility] CM Short Term Goal #1   over then next 30 days the patient will be able to verbalize 2-3 worsening symptoms to report to MD as vebalized during future outreach  [facility] CM Short Term Goal #1 Start Date  11/13/19  Interventions for Short Term Goal #1  assessed secretion production and home care, encourage to speak with MD encouraged to continue to monitor sats and report lower than 88%,     Tamorah Hada L. Lavina Hamman, RN, BSN, [place] Coordinator Office number [phone] Mobile number [phone]  Main [facility] number [phone] Fax number [phone]

## 2019-12-23 ENCOUNTER — Other Ambulatory Visit: Payer: Self-pay | Admitting: Thoracic Surgery (Cardiothoracic Vascular Surgery)

## 2019-12-23 DIAGNOSIS — Z902 Acquired absence of lung [part of]: Secondary | ICD-10-CM

## 2019-12-23 NOTE — Progress Notes (Unsigned)
cxr 

## 2019-12-24 ENCOUNTER — Other Ambulatory Visit: Payer: Self-pay

## 2019-12-24 ENCOUNTER — Ambulatory Visit
Admission: RE | Admit: 2019-12-24 | Discharge: 2019-12-24 | Disposition: A | Discharge status: Home / Self Care | Payer: Medicare Other | Source: Ambulatory Visit | Attending: Thoracic Surgery (Cardiothoracic Vascular Surgery) | Admitting: Thoracic Surgery (Cardiothoracic Vascular Surgery)

## 2019-12-24 ENCOUNTER — Encounter: Payer: Medicare Other | Admitting: Thoracic Surgery (Cardiothoracic Vascular Surgery)

## 2019-12-24 ENCOUNTER — Encounter: Payer: Self-pay | Admitting: Thoracic Surgery (Cardiothoracic Vascular Surgery)

## 2019-12-24 ENCOUNTER — Ambulatory Visit (INDEPENDENT_AMBULATORY_CARE_PROVIDER_SITE_OTHER): Payer: Self-pay | Admitting: Thoracic Surgery (Cardiothoracic Vascular Surgery)

## 2019-12-24 VITALS — BP 135/86 | HR 83 | Temp 97.7°F | Resp 24 | Ht 66.0 in | Wt 186.0 lb

## 2019-12-24 DIAGNOSIS — C3491 Malignant neoplasm of unspecified part of right bronchus or lung: Secondary | ICD-10-CM

## 2019-12-24 DIAGNOSIS — Z902 Acquired absence of lung [part of]: Secondary | ICD-10-CM

## 2019-12-24 DIAGNOSIS — R918 Other nonspecific abnormal finding of lung field: Secondary | ICD-10-CM | POA: Diagnosis not present

## 2019-12-24 NOTE — Progress Notes (Signed)
[address]       [place] [address]             [phone]       HPI: [name] returns for a scheduled follow-up visit  [name] is an 79 year old man with a history of tobacco abuse, COPD, lingular sparing left upper lobectomy for squamous cell carcinoma, obesity, sleep apnea, arthritis, anxiety, and depression.  He had a right upper lobe wedge resection on 10/28/2019 for a stage Ia squamous cell carcinoma.  He went home on postoperative day #4.  He did go home on oxygen.  He had a rough time initially after going home.  I last saw him in the office on 11/26/2019.  He was doing better at that time but was still using oxygen.  He is continued to improve since then but is still using oxygen.  He has not been using his Symbicort on a regular basis.  He has been using albuterol intermittently and Symbicort intermittently.  He still has some discomfort from the incisions, but is not using any narcotics.  Past Medical History:  Diagnosis Date  . Anxiety    pt. reports that he " could swing from the ......" because he is so anxious about the surgery   . Arthritis    R hand tendonitis   . COPD (chronic obstructive pulmonary disease) ([place])   . Depression   . Dyspnea   . History of kidney stones   . Hyperlipidemia   . lung ca dx'd 10/2016  . Pneumonia   . Sleep apnea    cannot tolerate cpap    Current Outpatient Medications  Medication Sig Dispense Refill  . acetaminophen (TYLENOL) 325 MG tablet Take 2 tablets (650 mg total) by mouth every 6 (six) hours as needed for mild pain (or Fever >/= 101).    Marland Kitchen albuterol (PROVENTIL) (2.5 MG/3ML) 0.083% nebulizer solution Take 3 mLs (2.5 mg total) by nebulization every 2 (two) hours as needed for wheezing. 75 mL 3  . budesonide-formoterol (SYMBICORT) 160-4.5 MCG/ACT inhaler Inhale 2 puffs into the lungs 2 (two) times daily.    . fexofenadine (ALLEGRA) 180 MG tablet Take 180 mg by mouth daily as needed for allergies or  rhinitis.    Marland Kitchen sertraline (ZOLOFT) 100 MG tablet Take 100 mg by mouth daily.    . simvastatin (ZOCOR) 20 MG tablet Take 20 mg by mouth every evening.    . traZODone (DESYREL) 50 MG tablet Take 50 mg by mouth at bedtime.   1   No current facility-administered medications for this visit.    Physical Exam BP 135/86 (BP Location: Right Arm, Patient Position: Sitting, Cuff Size: Normal)   Pulse 83   Temp 97.7 F (36.5 C) (Temporal)   Resp (!) 24   Ht 5\' 6"  (1.676 m)   Wt 186 lb (84.4 kg)   SpO2 92% Comment: RA (uses O2 @ 3 LPM w/ activity only)  BMI 30.38 kg/m  79 year old man in no acute distress Alert and oriented x3 with no focal deficits Lungs diminished but otherwise clear bilaterally, no wheezing Cardiac regular rate and rhythm No peripheral edema Incisions healing well  Diagnostic Tests: CHEST - 2 VIEW  COMPARISON:  11/26/2019  FINDINGS: Heart is normal size. Bibasilar atelectasis or scarring. No visible significant effusions. No pneumothorax. No acute bony abnormality.  IMPRESSION: Bibasilar scarring or atelectasis.  No pneumothorax.   Electronically Signed   By: Rolm Baptise M.D.  On: 12/24/2019 13:46 I personally reviewed the chest x-ray images and concur with the findings noted above  Impression: [name] is an 79 year old gentleman with a history of a lingular sparing left upper lobectomy for stage Ib squamous cell carcinoma, who underwent a robotic wedge resection and lymph node sampling for a stage Ia squamous cell carcinoma about 2 months ago.  He is having some discomfort but is not requiring any narcotics.  His incisions are healing well.  However, he remains on oxygen.  In talking to him he has not been using his Symbicort on a regular basis.  He was using either that or his albuterol.  I instructed him to use Symbicort as prescribed 2 puffs twice daily.  He then can use albuterol as needed.  I encouraged him to use the albuterol a couple of times  a day for the next couple of weeks to see if that helps.  I think there may be a component of bronchospasm to his shortness of breath more so than hypoxia.  He has begun driving.  He otherwise is doing well and is slowly making progress.  He has an appointment with Dr. Julien Nordmann in early June.  Plan: Follow-up with Dr. Julien Nordmann in June I will see him back in 2 months to check on his progress  [name], MD [facility] [phone]

## 2020-01-15 ENCOUNTER — Other Ambulatory Visit: Payer: Self-pay | Admitting: *Deleted

## 2020-01-15 ENCOUNTER — Encounter: Payer: Self-pay | Admitting: *Deleted

## 2020-01-15 ENCOUNTER — Other Ambulatory Visit: Payer: Self-pay

## 2020-01-15 NOTE — Patient Outreach (Signed)
[place] [facility]) Care Management  01/15/2020  [name] 12-06-1940 [id number]   [facility] outreach to complex care patient (previous EMMI referred patient)  On APL   Date:Sunday 11/10/19 1602Red Alert Reason:Sad/hopeless/anxious/empty? Yes EMMI red alert resolved   Insurance:NextGen Medicare  [facility] admissions x1ED visits x0in the last 6 months Admission 10/28/19 for robotic assisted thorascopy wedge resection surgery for right upper Lobe nodule  Transition of care services noted to be completed by primary care MD office staff- [facility] [name]  Transition of Care will be completed by primary care provider office who will refer to [facility] care management if needed..   Successful outreach to [name] Patient is able to verify HIPAA ([place] and Accountability Act) identifiers, date of birth (DOB) and address Reviewed purpose of the call with patient- follow up and progress to complex care services  [name] reports today that he has been back to see his surgeon, has noted increased breathing issues and is being scheduled for further follow up  Today David & [name] discussed the concerns of [name] noting decreased oxygen saturations at rest ranging from 72 to 98% and an increase in the use of his oxygen. Audible hoarseness sounds not heard today as was heard on   He confirms he does have a productive cough of thick white to brown secretions at times He denies fever, pain or blood tinge secretions. He is confirms has completed his prednisone  He confirms his hands are always cool but discusses that he does not feel the Symbicort (Budesonide & formoterol combination) inhaler is working as good as the albuterol nebulizer. He is aware that the Symbicort is maintenance and the albuterol is to be used in emergency situations. [name] consulted with her sister who reports success with Budesonide 0.5 mg  And perforomist  (formoterol)20 mcg via nebulizer.  [facility] RN CM discussed options of speaking with MD about changing the route of the Symbicort to either via nebulizer or via spacer. [facility] RN CM discussed how a spacer on an inhaler is used to contain the medicine for better administration  David &[name] agree for [facility] RN CM to contact [name] office for assistance with this route change as [name] confirms he has not seen David Glynn Octave, pulmonologist in a while as he has been "dealing with my cancer"   [facility] RN CM answered questions for [name] with [name] permission [name] shared her concern of them not being able to go outside of the home as the oxygen tanks have limited use. [facility] RN CM discussed the option of contacting the DME oxygen provider to get extra oxygen tanks   He reports he continues to get frustrated at times but with improvements.   Incision site continues to heal  [name] agreed to a follow up outreach within the next 14-21 business days   [facility] RN CM intervention- Voice message to pcp RN  [facility] RN CM called David Danna Hefty office [facility] RN CM let a message to include [facility] RN CM contact number and [name] home number for a return call. [facility] RN CM reviewed [name] concern with Symbicort inhaler use not being as effective as his albuterol nebulizer use. 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, his dog, [name] He denies issues with medical transportation or care needs  Conditions: s/p robotic wedge resection and node sampling -10/28/19 hx of stage 1 squamous cell carcinoma of left lung, hypoxia, tobacco abuse,Chronic obstructive pulmonary disease (COPD), sleep apnea,Hypertension  (HTN).peripheral vascular disease (PVD), arthritis, anxiety, depression (on Zoloft), history (hx) of lung resection in 2018, hx of kidney stones,Hyperlipidemia (HLD), asthma, allergic rhinitis  DMEOxygen ([employer]) 3 L concentrator,  walkerBP cuff,nebulizer   Plan: [facility] RN CM scheduled this patient for another call attempt follow up outreach within the next 14-21 business days as agreed upon  Pt encouraged to return a call to [facility] RN CM prn Routed note to MD  [facility] CM Care Plan Problem One     Most Recent Value  Care Plan Problem One  knowledge deficit of home care for COPD, HTN, s/p lung cancer  Role Documenting the Problem One  Care Management Telephonic Coordinator  Care Plan for Problem One  Active  [facility] Long Term Goal   over the next 60 days the patient will be able to verbalize action plans manage HTN, COPD and post cancer symptoms as verbalized during future outreach  [facility] Long Term Goal Start Date  11/13/19  Interventions for Problem One Long Term Goal  assessed for home respiratory managment, answered questions about albuterol and symbicort, discuss optional routes of symbicort, left voice message for David Danna Hefty RN related to possible assist with changing route of symbicort administation   [facility] CM Short Term Goal #1   over then next 30 days the patient will be able to verbalize 2-3 worsening symptoms to report to MD as vebalized during future outreach  [facility] CM Short Term Goal #1 Start Date  11/13/19  Interventions for Short Term Goal #1  assessed for home respiratory managment, answered questions about albuterol and symbicort, discuss optional routes of symbicort, left voice message for David Danna Hefty RN related to possible assist with changing route of symbicort administation      David Ferrentino L. Lavina Hamman, RN, BSN, [place] Coordinator Office number [phone] Mobile number [phone]  Main [facility] number [phone] Fax number [phone]

## 2020-01-17 ENCOUNTER — Telehealth: Payer: Self-pay

## 2020-01-17 ENCOUNTER — Telehealth: Payer: Self-pay | Admitting: Internal Medicine

## 2020-01-17 ENCOUNTER — Other Ambulatory Visit: Payer: Self-pay | Admitting: *Deleted

## 2020-01-17 NOTE — Telephone Encounter (Signed)
Received a message from Jackelyn Poling, RN w/ [facility], stating that pt is wanting to try a different type of nebulizer d/t persistent sob. She stated that she reached out to Dr. Dagmar Hait, pt's PCP, about this on 01/15/20, but has not received a response. TC to pt, who confirms that he is wanting to try a different (or additional) nebulizer to improve his dyspnea. Informed him that Dr. Dagmar Hait, who originally ordered his Symbicort inhaler and Albuterol nebs, or his pulmonologist should manage these medications rather than Dr. Roxan Hockey. Reinforced Dr. Leonarda Salon advice from [place] on 12/24/19 about using Symbicort 2 puffs bid and increasing the frequency of Albuterol nebs (up to q 2 hrs per Rx), and pt states he has not been using the Albuterol nebs more frequently. Advised to use Albuterol nebs up to q 2 hrs prn and to contact Dr. Danna Hefty office directly (rather than through RN) if he feels the need for additional inhalers or nebulizers. Also suggested f/u with his pulmonologist, Dr. Annamaria Boots, since it does not appear that he has seen him since 2015. Pt verbalizes understanding. To f/u with Dr. Roxan Hockey 02/25/20.

## 2020-01-17 NOTE — Patient Outreach (Signed)
[facility]) Care Management  01/17/2020  JALEAL SCHLIEP Jan 13, 1941 [id number]   [place] coordination- patient message, calls to MDs r/t symbicort   [facility] RN CM received a message left by Mr Tomei stating he has not had outreach from Dr Dagmar Hait office related to possible assist with medication route change for symibort [facility] RN CM reviewed Epic notes [facility] RN CM called Dr Dagmar Hait office, transferred to the medical assistant, Marcene Brawn and left a voice message requesting a return call to [facility] RN CM or patient [facility] RN CM called also to Dr Hendrickson's office to possibly request assistance Transferred to nurse and message was left for [facility], RN   [facility] RN CM called Dr Annamaria Boots office and spoke with Maudie Mercury receptionist who confirms Mr Michel has not been since 2014 and may need the pcp to refer him back to Dr Annamaria Boots but she sent a message to Dr Janee Morn nurse  [facility] RN CM spoke with Mr Renovato on his mobile number  Patient is able to verify HIPAA ([place] and Accountability Act) identifiers, date of birth (DOB) and address   He reports he did get an call from Dr Roxan Hockey office staff who discussed having Dr Dagmar Hait to assist with the change in symbicort route as Dr Dagmar Hait prescribed Symbicort Mr Staples informed of the calls to Dr Dagmar Hait, and Dr Annamaria Boots also Mr Scully will attempt another call to Dr Dagmar Hait office today  He denies being in distress He has not had to use his oxygen today and has been outside briefly without increased symptoms   Plan [facility] RN CM scheduled this patient for another call attempt follow up outreach within the next 14-21 business days as agreed upon Pt encouraged to return a call to [facility] RN CM prn   Joelene Millin L. Lavina Hamman, RN, BSN, [place] Coordinator Office number [phone] Mobile number [phone]  Main [facility] number [phone] Fax number [phone]

## 2020-01-20 NOTE — Telephone Encounter (Signed)
We have not seen the pt since 2014. Left a detailed message on Kim's confidential voicemail, making her aware of this information.

## 2020-01-23 ENCOUNTER — Telehealth: Payer: Self-pay | Admitting: Internal Medicine

## 2020-01-23 NOTE — Telephone Encounter (Signed)
R/s appt on 6/14. Lab before CT. Pt aware of appt.

## 2020-01-27 ENCOUNTER — Other Ambulatory Visit: Payer: Medicare Other

## 2020-01-27 ENCOUNTER — Inpatient Hospital Stay: Payer: Medicare Other | Attending: Internal Medicine

## 2020-01-27 ENCOUNTER — Ambulatory Visit (HOSPITAL_COMMUNITY)
Admission: RE | Admit: 2020-01-27 | Discharge: 2020-01-27 | Disposition: A | Discharge status: Home / Self Care | Payer: Medicare Other | Source: Ambulatory Visit | Attending: Internal Medicine | Admitting: Internal Medicine

## 2020-01-27 ENCOUNTER — Other Ambulatory Visit: Payer: Self-pay

## 2020-01-27 DIAGNOSIS — Z902 Acquired absence of lung [part of]: Secondary | ICD-10-CM | POA: Diagnosis not present

## 2020-01-27 DIAGNOSIS — C3412 Malignant neoplasm of upper lobe, left bronchus or lung: Secondary | ICD-10-CM | POA: Diagnosis not present

## 2020-01-27 DIAGNOSIS — C3490 Malignant neoplasm of unspecified part of unspecified bronchus or lung: Secondary | ICD-10-CM

## 2020-01-27 DIAGNOSIS — Z79899 Other long term (current) drug therapy: Secondary | ICD-10-CM | POA: Diagnosis not present

## 2020-01-27 DIAGNOSIS — E785 Hyperlipidemia, unspecified: Secondary | ICD-10-CM | POA: Insufficient documentation

## 2020-01-27 DIAGNOSIS — J449 Chronic obstructive pulmonary disease, unspecified: Secondary | ICD-10-CM | POA: Diagnosis not present

## 2020-01-27 DIAGNOSIS — I10 Essential (primary) hypertension: Secondary | ICD-10-CM | POA: Insufficient documentation

## 2020-01-27 LAB — CMP (CANCER CENTER ONLY)
ALT: 8 U/L (ref 0–44)
AST: 9 U/L — ABNORMAL LOW (ref 15–41)
Albumin: 3.6 g/dL (ref 3.5–5.0)
Alkaline Phosphatase: 95 U/L (ref 38–126)
Anion gap: 7 (ref 5–15)
BUN: 15 mg/dL (ref 8–23)
CO2: 33 mmol/L — ABNORMAL HIGH (ref 22–32)
Calcium: 9.8 mg/dL (ref 8.9–10.3)
Chloride: 101 mmol/L (ref 98–111)
Creatinine: 0.75 mg/dL (ref 0.61–1.24)
GFR, Est AFR Am: >60 mL/min (ref >60)
GFR, Estimated: >60 mL/min (ref >60)
Glucose, Bld: 105 mg/dL — ABNORMAL HIGH (ref 70–99)
Potassium: 4.4 mmol/L (ref 3.5–5.1)
Sodium: 141 mmol/L (ref 135–145)
Total Bilirubin: 0.5 mg/dL (ref 0.3–1.2)
Total Protein: 7.2 g/dL (ref 6.5–8.1)

## 2020-01-27 LAB — CBC WITH DIFFERENTIAL (CANCER CENTER ONLY)
Abs Immature Granulocytes: 0.09 10*3/uL — ABNORMAL HIGH (ref 0.00–0.07)
Basophils Absolute: 0.1 10*3/uL (ref 0.0–0.1)
Basophils Relative: 1 %
Eosinophils Absolute: 0.3 10*3/uL (ref 0.0–0.5)
Eosinophils Relative: 3 %
HCT: 45.4 % (ref 39.0–52.0)
Hemoglobin: 15 g/dL (ref 13.0–17.0)
Immature Granulocytes: 1 %
Lymphocytes Relative: 7 %
Lymphs Abs: 0.8 10*3/uL (ref 0.7–4.0)
MCH: 30.9 pg (ref 26.0–34.0)
MCHC: 33 g/dL (ref 30.0–36.0)
MCV: 93.6 fL (ref 80.0–100.0)
Monocytes Absolute: 1.3 10*3/uL — ABNORMAL HIGH (ref 0.1–1.0)
Monocytes Relative: 12 %
Neutro Abs: 8.2 10*3/uL — ABNORMAL HIGH (ref 1.7–7.7)
Neutrophils Relative %: 76 %
Platelet Count: 334 10*3/uL (ref 150–400)
RBC: 4.85 MIL/uL (ref 4.22–5.81)
RDW: 13.6 % (ref 11.5–15.5)
WBC Count: 10.8 10*3/uL — ABNORMAL HIGH (ref 4.0–10.5)
nRBC: 0 % (ref 0.0–0.2)

## 2020-01-27 MED ORDER — SODIUM CHLORIDE (PF) 0.9 % IJ SOLN
INTRAMUSCULAR | Status: AC
  Filled 2020-01-27: qty 50

## 2020-01-27 MED ORDER — IOHEXOL 300 MG/ML  SOLN
75.0000 mL | Freq: Once | INTRAMUSCULAR | Status: AC | PRN
  Administered 2020-01-27: 75 mL via INTRAVENOUS

## 2020-01-29 ENCOUNTER — Encounter: Payer: Self-pay | Admitting: Internal Medicine

## 2020-01-29 ENCOUNTER — Inpatient Hospital Stay (HOSPITAL_BASED_OUTPATIENT_CLINIC_OR_DEPARTMENT_OTHER): Payer: Medicare Other | Admitting: Internal Medicine

## 2020-01-29 ENCOUNTER — Other Ambulatory Visit: Payer: Self-pay

## 2020-01-29 ENCOUNTER — Ambulatory Visit: Payer: Self-pay | Admitting: *Deleted

## 2020-01-29 VITALS — BP 161/68 | HR 88 | Temp 97.9°F | Resp 20 | Ht 66.0 in | Wt 181.6 lb

## 2020-01-29 DIAGNOSIS — Z902 Acquired absence of lung [part of]: Secondary | ICD-10-CM | POA: Diagnosis not present

## 2020-01-29 DIAGNOSIS — C3412 Malignant neoplasm of upper lobe, left bronchus or lung: Secondary | ICD-10-CM | POA: Diagnosis not present

## 2020-01-29 DIAGNOSIS — I10 Essential (primary) hypertension: Secondary | ICD-10-CM

## 2020-01-29 DIAGNOSIS — C3492 Malignant neoplasm of unspecified part of left bronchus or lung: Secondary | ICD-10-CM | POA: Diagnosis not present

## 2020-01-29 DIAGNOSIS — Z79899 Other long term (current) drug therapy: Secondary | ICD-10-CM | POA: Diagnosis not present

## 2020-01-29 DIAGNOSIS — E785 Hyperlipidemia, unspecified: Secondary | ICD-10-CM | POA: Diagnosis not present

## 2020-01-29 DIAGNOSIS — J449 Chronic obstructive pulmonary disease, unspecified: Secondary | ICD-10-CM | POA: Diagnosis not present

## 2020-01-29 DIAGNOSIS — C3490 Malignant neoplasm of unspecified part of unspecified bronchus or lung: Secondary | ICD-10-CM

## 2020-01-29 NOTE — Addendum Note (Signed)
Addended by: Ardeen Garland on: 01/29/2020 12:58 PM   Modules accepted: Orders

## 2020-01-29 NOTE — Progress Notes (Signed)
[place] Telephone:([phone]) [phone]   Fax:(336) [phone]  OFFICE PROGRESS NOTE  [name], MD [place] [address]  DIAGNOSIS: Stage IB (T2a, N0, M0) non-small cell lung cancer, poorly differentiated invasive squamous cell carcinoma diagnosed in April 2018  PRIOR THERAPY: Status post left upper lobectomy with lymph node dissection under the care of Dr. Roxan Hockey on 12/12/2016 with tumor size of 3.2 cm.  CURRENT THERAPY: Observation.  INTERVAL HISTORY: [name] 79 y.o. male returns to the clinic today for follow-up visit.  The patient is feeling fine today with no concerning complaints except for the baseline shortness of breath and he is currently on home oxygen.  He is currently on albuterol nebulizer by his primary care physician.  He has not seen his pulmonologist for several years.  The patient denied having any chest pain, cough or hemoptysis.  He denied having any fever or chills.  He has no nausea, vomiting, diarrhea or constipation.  He has no headache or visual changes.  He had repeat CT scan of the chest performed recently and is here for evaluation and discussion of his discuss results.   MEDICAL HISTORY: Past Medical History:  Diagnosis Date  . Anxiety    pt. reports that he " could swing from the ......" because he is so anxious about the surgery   . Arthritis    R hand tendonitis   . COPD (chronic obstructive pulmonary disease) ([place])   . Depression   . Dyspnea   . History of kidney stones   . Hyperlipidemia   . lung ca dx'd 10/2016  . Pneumonia   . Sleep apnea    cannot tolerate cpap    ALLERGIES:  is allergic to nsaids, gadolinium derivatives, and penicillins.  MEDICATIONS:  Current Outpatient Medications  Medication Sig Dispense Refill  . acetaminophen (TYLENOL) 325 MG tablet Take 2 tablets (650 mg total) by mouth every 6 (six) hours as needed for mild pain (or Fever >/= 101).    Marland Kitchen albuterol (PROVENTIL) (2.5  MG/3ML) 0.083% nebulizer solution Take 3 mLs (2.5 mg total) by nebulization every 2 (two) hours as needed for wheezing. 75 mL 3  . budesonide-formoterol (SYMBICORT) 160-4.5 MCG/ACT inhaler Inhale 2 puffs into the lungs 2 (two) times daily.    . fexofenadine (ALLEGRA) 180 MG tablet Take 180 mg by mouth daily as needed for allergies or rhinitis.    Marland Kitchen sertraline (ZOLOFT) 100 MG tablet Take 100 mg by mouth daily.    . simvastatin (ZOCOR) 20 MG tablet Take 20 mg by mouth every evening.    . traZODone (DESYREL) 50 MG tablet Take 50 mg by mouth at bedtime.   1   No current facility-administered medications for this visit.    SURGICAL HISTORY:  Past Surgical History:  Procedure Laterality Date  . COLONOSCOPY W/ POLYPECTOMY    . cyst back  2011  . CYSTOSCOPY  2012   for bleeding in bladder x 3  . INTERCOSTAL NERVE BLOCK Right 10/28/2019   Procedure: Intercostal Nerve Block;  Surgeon: [name], MD;  Location: [facility];  Service: Thoracic;  Laterality: Right;  . MOUTH SURGERY    . NODE DISSECTION  10/28/2019   Procedure: Node Dissection;  Surgeon: [name], MD;  Location: [facility];  Service: Thoracic;;  . VIDEO ASSISTED THORACOSCOPY (VATS)/ LOBECTOMY Left 12/12/2016   Procedure: VIDEO ASSISTED THORACOSCOPY (VATS)/LEFT UPPER LOBECTOMY;  Surgeon: [name], MD;  Location: [facility];  Service: Thoracic;  Laterality: Left;  Marland Kitchen VIDEO BRONCHOSCOPY Right 10/28/2019   VIDEO BRONCHOSCOPY WITH ENDOBRONCHIAL NAVIGATION (N/A )   . VIDEO BRONCHOSCOPY WITH ENDOBRONCHIAL NAVIGATION N/A 10/28/2019   Procedure: VIDEO BRONCHOSCOPY WITH ENDOBRONCHIAL NAVIGATION;  Surgeon: [name], MD;  Location: [facility];  Service: Thoracic;  Laterality: N/A;    REVIEW OF SYSTEMS:  A comprehensive review of systems was negative except for: Respiratory: positive for dyspnea on exertion   PHYSICAL EXAMINATION: General appearance: alert, cooperative and no distress Head: Normocephalic, without  obvious abnormality, atraumatic Neck: no adenopathy, no JVD, supple, symmetrical, trachea midline and thyroid not enlarged, symmetric, no tenderness/mass/nodules Lymph nodes: Cervical, supraclavicular, and axillary nodes normal. Resp: clear to auscultation bilaterally Back: symmetric, no curvature. ROM normal. No CVA tenderness. Cardio: regular rate and rhythm, S1, S2 normal, no murmur, click, rub or gallop GI: soft, non-tender; bowel sounds normal; no masses,  no organomegaly Extremities: extremities normal, atraumatic, no cyanosis or edema  ECOG PERFORMANCE STATUS: 1 - Symptomatic but completely ambulatory  Blood pressure (!) 161/68, pulse 88, temperature 97.9 F (36.6 C), temperature source Temporal, resp. rate 20, height 5\' 6"  (1.676 m), weight 181 lb 9.6 oz (82.4 kg), SpO2 90 %.  LABORATORY DATA: Lab Results  Component Value Date   WBC 10.8 (H) 01/27/2020   HGB 15.0 01/27/2020   HCT 45.4 01/27/2020   MCV 93.6 01/27/2020   PLT 334 01/27/2020      Chemistry      Component Value Date/Time   NA 141 01/27/2020 1124   NA 139 07/17/2017 1049   K 4.4 01/27/2020 1124   K 4.4 07/17/2017 1049   CL 101 01/27/2020 1124   CO2 33 (H) 01/27/2020 1124   CO2 29 07/17/2017 1049   BUN 15 01/27/2020 1124   BUN 14.8 07/17/2017 1049   CREATININE 0.75 01/27/2020 1124   CREATININE 0.8 07/17/2017 1049      Component Value Date/Time   CALCIUM 9.8 01/27/2020 1124   CALCIUM 9.3 07/17/2017 1049   ALKPHOS 95 01/27/2020 1124   ALKPHOS 81 07/17/2017 1049   AST 9 (L) 01/27/2020 1124   AST 16 07/17/2017 1049   ALT 8 01/27/2020 1124   ALT 22 07/17/2017 1049   BILITOT 0.5 01/27/2020 1124   BILITOT 0.55 07/17/2017 1049       RADIOGRAPHIC STUDIES: CT Chest W Contrast  Result Date: 01/27/2020 CLINICAL DATA:  Stage of non-small cell lung cancer. Prior surgery. Shortness of breath EXAM: CT CHEST WITH CONTRAST TECHNIQUE: Multidetector CT imaging of the chest was performed during intravenous  contrast administration. CONTRAST:  2mL OMNIPAQUE IOHEXOL 300 MG/ML  SOLN COMPARISON:  Multiple exams, including 09/25/2019 FINDINGS: Cardiovascular: Coronary, aortic arch, and branch vessel atherosclerotic vascular disease. Mediastinum/Nodes: Stable right thyroid nodule. In the setting of significant comorbidities or limited life expectancy, no follow-up recommended (ref: J Am Coll Radiol. 2015 Feb;12(2): 143-50). Lower paratracheal lymph node 1.0 cm in diameter in the precarinal region on image 55/2, previously 0.8 cm. Right infrahilar node 0.9 cm in short axis on image 79/2, formerly 0.6 cm. Lungs/Pleura: New small right pleural effusion, probably loculated or exudative based on appearance, cannot exclude malignant effusion. New postoperative findings including wedge resection clips in the right upper lobe to include the vicinity of the prior right upper lobe subpleural nodule adjacent to the major fissure. There is scarring in this vicinity, no obvious residual tumor although surveillance is likely necessary. Postoperative findings in the left upper lobe. Superior segment left lower lobe nodule  0.7 by 0.5 cm on image 39/5, previously the same by my measurements. Centrilobular emphysema. Upper Abdomen: Density posterior to the descending colon on image 168/2 is stable and probably a diverticulum. No adrenal mass. Musculoskeletal: Stable small sclerotic lesion anteriorly in the right eighth rib, image 120/5, probably benign. Thoracic spondylosis. IMPRESSION: 1. Interval wedge resection of the right upper lobe nodule. There is scarring in this vicinity, no obvious residual tumor although surveillance is likely warranted. 2. New small right pleural effusion, high possibly exudative. No obvious pleural mass, although malignant effusion is not excluded by CT. Once again, surveillance is likely warranted. 3. Borderline enlarged right infrahilar and precarinal lymph nodes, nonspecific. 4. superior segment left lower  lobe nodule, 5 by 7 mm, stable from 2008 and considered benign. 5. Other imaging findings of potential clinical significance: Coronary, aortic arch, and branch vessel atherosclerotic vascular disease. Stable right thyroid nodule. Stable small sclerotic lesion anteriorly in the right eighth rib, probably benign. Stable diverticulum posterior to the descending colon. 6. Emphysema and aortic atherosclerosis. Aortic Atherosclerosis (ICD10-I70.0) and Emphysema (ICD10-J43.9). Electronically Signed   By: Van Clines M.D.   On: 01/27/2020 14:23    ASSESSMENT AND PLAN: This is a very pleasant 79 years old white male recently diagnosed with a stage IB non-small cell lung cancer, squamous cell carcinoma presented with left upper lobe lung mass status post left upper lobectomy with lymph node dissection. The tumor size was 3.2 cm. There is no evidence for visceral pleural or lymphovascular invasion. The patient has been in observation since that time and he has been doing fine. Repeated imaging studies over the last year showed evidence for enlarging posterior segment right upper lobe nodule worrisome for bronchogenic carcinoma and it showed activity on the previous PET scan.  The patient was evaluated by Dr. Roxan Hockey and he consider him for surgical resection versus SBRT versus continuous observation.  The patient chose continuous observation at that time but the recent scan showed further enlargement of the nodule. The patient had repeat CT scan of the chest performed recently.  I personally and independently reviewed the scan images and discussed the results with the patient.  His scan showed no concerning findings for disease progression or metastasis. I recommended for the patient to continue on observation with repeat CT scan of the chest in 1 year.  He was advised to call sooner if he has any concerning symptoms. For the hypertension, he mentioned that his blood pressure is usually normal at home.  He  will continue to monitor it closely. The patient voices understanding of current disease status and treatment options and is in agreement with the current care plan. All questions were answered. The patient knows to call the clinic with any problems, questions or concerns. We can certainly see the patient much sooner if necessary.  Disclaimer: This note was dictated with voice recognition software. Similar sounding words can inadvertently be transcribed and may not be corrected upon review.

## 2020-01-31 ENCOUNTER — Telehealth: Payer: Self-pay | Admitting: Internal Medicine

## 2020-01-31 NOTE — Telephone Encounter (Signed)
Scheduled per los. Called and confirmed appt

## 2020-02-03 ENCOUNTER — Other Ambulatory Visit: Payer: Self-pay | Admitting: *Deleted

## 2020-02-03 ENCOUNTER — Encounter: Payer: Self-pay | Admitting: *Deleted

## 2020-02-03 ENCOUNTER — Other Ambulatory Visit: Payer: Self-pay

## 2020-02-03 NOTE — Patient Outreach (Signed)
[facility]) Care Management  02/03/2020  [name] Sep 10, 1948 [id number]  [facility] outreach to complex care patient (previous EMMI referred patient) On APL   Date:Sunday 11/10/19 1602Red Alert Reason:Sad/hopeless/anxious/empty? Yes EMMI red alert resolved  Insurance:[employer] Medicare  [facility] admissions x1ED visits x0in the last 6 months Admission 10/28/19 for robotic assisted thorascopy wedge resection surgery for right upper Lobe nodule  Successful outreach to [name] Patient is able to verify HIPAA ([place] and Accountability Act) identifiers, date of birth (DOB) and address Reviewedpurpose of the callwith patient- follow up on change in medicine to nebulizer route and progress to complex care services   Nebulizer medicine/Chronic obstructive pulmonary disease (COPD)  His medicine was changed to pulmicort via nebulizer bid (twice a day) for control maintenance. Has been using it as recommended since 01/22/20 He uses his albuterol as needed for rescure/emergency control and report the last use of albuterol was on Sunday 02/02/20 as he was having trouble breathing "got tight"  He reports he is able to go without oxygen more now in the home but if he goes outside he takes oxygen with him He reports doing a trial this morning outside without using oxygen and it went well  [name] is noted with audible congestion during this outreach. He reports part of his home action plan is to clean his nose. He blows his nose.  [facility] RN CM inquired about daily nasal irrigation  (example neti pot or saline spray) he denies daily nasal irrigations. [facility] RN CM encourage adding this to his action plan to flush out mucus and debris from the nose ans sinuses to decrease his congestion and increase breathing He also was encouraged to use any incentive spirometer he may have at the home. He jokingly reports he does have a type of incentive spirometer that reminds  him of a "hand grenade"   hx of stage 1 squamous cell carcinoma of left lung office follow up  Saw Dr Julien Nordmann on last Wednesday, January 29 2020 now have annual appointments scheduled [name] voiced being confused after leaving last oncology visit He explains he was informed his imaging report was good but recalls being informed of "an area" that the provider was "not concerned with" and some congestion"  [facility] RN Cm reviewed oncology office visit  Notes and after visit summary with [name] [facility] RN Cm revieewed his 01/27/20 CT scan impression  with [name] to explain the noted "New small right pleural effusion, high possibly exudative. No obvious pleural mass, although malignant effusion is not excluded by CT. Once again, surveillance is likely warranted."  Answered all questions about pleural effusion, congestion.Encourage use of incentive spirometry, nasal irrigation and COPD medications [name] confirms Dr Henderickson had put him on a diuretic but he did not take all of the prescriptions also recommended. He reports Dr Blase Butler also discussed use of  Mucinex. [facility] RN CM also encouraged use of mucinex but encouraged hydration with the use. 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 lives at home with his wife, his dog, [name] He denies issues with medical transportation or care needs  Conditions: s/p robotic wedge resection and node sampling -10/28/19 hx of stage 1 squamous cell carcinoma of left lung, hypoxia, tobacco abuse,Chronic obstructive pulmonary disease (COPD), sleep apnea,Hypertension (HTN).peripheral vascular disease (PVD), arthritis, anxiety, depression (on Zoloft),  history (hx) of lung resection in 2018, hx of kidney stones,Hyperlipidemia (HLD), asthma, allergic rhinitis  DMEOxygen ([employer]) 3 L concentrator,  walkerBP cuff,nebulizer pulse oximeter incentive spirometer   Appointment  February 25 2020  Dr Henderickson pulmonary surgeon Annual oncology follow up around 01/26/2021   Plan: [facility] RN CM scheduled this patient for another call attempt follow up outreach within the next 30-35 business days as agreed upon Pt encouraged to return a call to [facility] RN CM prn Routed note to MD  [facility] CM Care Plan Problem One     Most Recent Value  Care Plan Problem One knowledge deficit of home care for COPD, HTN, s/p lung cancer  Role Documenting the Problem One Care Management Telephonic Coordinator  Care Plan for Problem One Active  [facility] Long Term Goal  over the next 90 days the patient will be able to verbalize action plans manage HTN, COPD and post cancer symptoms as verbalized during future outreach  [facility] Long Term Goal Start Date 02/03/20  Interventions for Problem One Long Term Goal Assessed for new COPD nebulizer action plan./medicine, discussed nasal irrigation, incentive spirometer use, answered questions about oncology visit & imaging pluse pleural effusion, discuss use of mucinex, sent further EMMIs for cough, ruuny nose and the common cold, pleural effusion, how to use an incentive spirometer, how to use a nebulizer (Adult) and how to do a nasal rinse   [facility] CM Short Term Goal #1  over then next 30 days the patient will be able to verbalize 2-3 worsening symptoms to report to MD as vebalized during future outreach  [facility] CM Short Term Goal #1 Start Date 11/13/19  [facility] CM Short Term Goal #1 Met Date 02/03/20  [facility] CM Short Term Goal #2  over the next 60 days patient will add nasal irrigation and mucinex to his COPD action plan as verbalized during future outreach   [facility] CM Short Term Goal #2 Start Date 02/03/20  Interventions for Short Term Goal #2 Assessed for  new COPD nebulizer action plan./medicine, discussed nasal irrigation, incentive spirometer use, answered questions about oncology visit & imaging pluse pleural effusion, discuss use of mucinex, sent further EMMIs for cough, ruuny nose and the common cold, pleural effusion, how to use an incentive spirometer, how to use a nebulizer (Adult) and how to do a nasal rinse       Kyzen Horn L. Lavina Hamman, RN, BSN, [place] Coordinator Office number [phone] Mobile number [phone]  Main [facility] number [phone] Fax number [phone]

## 2020-02-19 ENCOUNTER — Other Ambulatory Visit: Payer: Self-pay | Admitting: Thoracic Surgery (Cardiothoracic Vascular Surgery)

## 2020-02-19 DIAGNOSIS — R918 Other nonspecific abnormal finding of lung field: Secondary | ICD-10-CM

## 2020-02-19 DIAGNOSIS — C3492 Malignant neoplasm of unspecified part of left bronchus or lung: Secondary | ICD-10-CM

## 2020-02-25 ENCOUNTER — Other Ambulatory Visit: Payer: Self-pay

## 2020-02-25 ENCOUNTER — Ambulatory Visit (INDEPENDENT_AMBULATORY_CARE_PROVIDER_SITE_OTHER): Payer: Medicare Other | Admitting: Thoracic Surgery (Cardiothoracic Vascular Surgery)

## 2020-02-25 ENCOUNTER — Ambulatory Visit
Admission: RE | Admit: 2020-02-25 | Discharge: 2020-02-25 | Disposition: A | Discharge status: Home / Self Care | Payer: Medicare Other | Source: Ambulatory Visit | Attending: Thoracic Surgery (Cardiothoracic Vascular Surgery) | Admitting: Thoracic Surgery (Cardiothoracic Vascular Surgery)

## 2020-02-25 VITALS — BP 132/88 | HR 85 | Temp 97.6°F | Resp 20 | Ht 66.0 in | Wt 180.0 lb

## 2020-02-25 DIAGNOSIS — C3492 Malignant neoplasm of unspecified part of left bronchus or lung: Secondary | ICD-10-CM | POA: Diagnosis not present

## 2020-02-25 DIAGNOSIS — Z902 Acquired absence of lung [part of]: Secondary | ICD-10-CM

## 2020-02-25 DIAGNOSIS — R918 Other nonspecific abnormal finding of lung field: Secondary | ICD-10-CM

## 2020-02-25 NOTE — Progress Notes (Signed)
[address]       [place], [address]             [phone]     HPI: [name] returns for a scheduled follow-up visit  [name] is an 79 year old man with a history of tobacco abuse, COPD, lingular sparing left upper lobectomy for squamous cell carcinoma, obesity, sleep apnea, arthritis, anxiety, and depression.  He had an enlarging right upper lobe lung nodule.  He underwent a right upper lobe wedge resection and node sampling on 10/28/2019.  That turned out to be a stage Ia squamous cell carcinoma.  He went home on oxygen on postoperative day #4.  I last saw him in the office on 12/24/2019.  He was still using oxygen but was starting to feel little better.  He was not using his inhalers regularly.  He had some incisional discomfort but was not using any narcotics.  In the interim since his last visit he saw Dr. Julien Nordmann.  He is scheduled to see him back in a year with a CT of the chest.  He is feeling better.  He has his oxygen with him, but it is not turned on.  He is still carrying it in case he needs it, but not using it very often.  He is not having any incisional pain.  Says he has noted his heart rate fluctuates as does his oxygen level depending on his degree of exertion.  Past Medical History:  Diagnosis Date  . Anxiety    pt. reports that he " could swing from the ......" because he is so anxious about the surgery   . Arthritis    R hand tendonitis   . COPD (chronic obstructive pulmonary disease) ([place])   . Depression   . Dyspnea   . History of kidney stones   . Hyperlipidemia   . lung ca dx'd 10/2016  . Pneumonia   . Sleep apnea    cannot tolerate cpap    Current Outpatient Medications  Medication Sig Dispense Refill  . acetaminophen (TYLENOL) 325 MG tablet Take 2 tablets (650 mg total) by mouth every 6 (six) hours as needed for mild pain (or Fever >/= 101).    Marland Kitchen albuterol (PROVENTIL) (2.5 MG/3ML) 0.083% nebulizer solution Take 3 mLs (2.5 mg  total) by nebulization every 2 (two) hours as needed for wheezing. 75 mL 3  . budesonide (PULMICORT) 1 MG/2ML nebulizer solution Take 1 mg by nebulization 2 (two) times daily.    . fexofenadine (ALLEGRA) 180 MG tablet Take 180 mg by mouth daily as needed for allergies or rhinitis.    Marland Kitchen sertraline (ZOLOFT) 100 MG tablet Take 100 mg by mouth daily.    . simvastatin (ZOCOR) 20 MG tablet Take 20 mg by mouth every evening.    . traZODone (DESYREL) 50 MG tablet Take 50 mg by mouth at bedtime.   1   No current facility-administered medications for this visit.    Physical Exam BP 132/88   Pulse 85   Temp 97.6 F (36.4 C) (Skin)   Resp 20   Ht 5\' 6"  (1.676 m)   Wt 180 lb (81.6 kg)   SpO2 93% Comment: RA  BMI 29.33 kg/m  79 year old man in no acute distress Alert and oriented x3 with no focal deficits Lungs diminished but relatively equal bilaterally Cardiac regular rate and rhythm with a normal S1 and S2  Diagnostic Tests: CHEST - 2 VIEW  COMPARISON:  12/24/2019  FINDINGS: Postoperative changes in the lungs bilaterally. Small bilateral pleural effusions with bibasilar atelectasis. Heart is normal size. No acute bony abnormality. No pneumothorax.  IMPRESSION: Postoperative changes bilaterally with small effusions and bibasilar atelectasis. No pneumothorax.   Electronically Signed   By: Rolm Baptise M.D.   On: 02/25/2020 12:04 I personally reviewed the chest x-ray images and concur with the findings noted above  Impression: [name] is an 79 year old man with a past history of tobacco abuse and COPD.  He had a stage Ib squamous cell carcinoma resected with a lingular sparing left upper lobectomy in 2018.  He had a new nodule in the right upper lobe that increased in size over time.  We did a wedge resection of that in March 2021.  It turned out to be stage Ia squamous cell carcinoma.  Based on appearance was likely a second primary rather than a metastasis.  There was no  evidence of any disease elsewhere.  He went home relatively soon after surgery but did require home oxygen.  He still has the oxygen but is not using it very often.  He brought up with him today in case he needed it but has not turned it on since he left home.  He saw Dr. Julien Nordmann.  No additional therapy was recommended.  He plan to see him back in a year with a CT of the chest.  Plan: Return in 1 year after having CT with Dr. Julien Nordmann.  I spent 10 minutes in review of records, images, and in consultation with [name] today. [name], MD [facility] [phone]

## 2020-04-16 DIAGNOSIS — E669 Obesity, unspecified: Secondary | ICD-10-CM | POA: Diagnosis not present

## 2020-04-16 DIAGNOSIS — R202 Paresthesia of skin: Secondary | ICD-10-CM | POA: Diagnosis not present

## 2020-04-16 DIAGNOSIS — F329 Major depressive disorder, single episode, unspecified: Secondary | ICD-10-CM | POA: Diagnosis not present

## 2020-04-16 DIAGNOSIS — G4733 Obstructive sleep apnea (adult) (pediatric): Secondary | ICD-10-CM | POA: Diagnosis not present

## 2020-04-16 DIAGNOSIS — J449 Chronic obstructive pulmonary disease, unspecified: Secondary | ICD-10-CM | POA: Diagnosis not present

## 2020-04-16 DIAGNOSIS — R0901 Asphyxia: Secondary | ICD-10-CM | POA: Diagnosis not present

## 2020-04-16 DIAGNOSIS — I10 Essential (primary) hypertension: Secondary | ICD-10-CM | POA: Diagnosis not present

## 2020-04-16 DIAGNOSIS — C3490 Malignant neoplasm of unspecified part of unspecified bronchus or lung: Secondary | ICD-10-CM | POA: Diagnosis not present

## 2020-04-21 ENCOUNTER — Other Ambulatory Visit: Payer: Self-pay | Admitting: *Deleted

## 2020-05-05 ENCOUNTER — Other Ambulatory Visit: Payer: Self-pay | Admitting: *Deleted

## 2020-05-05 NOTE — Patient Outreach (Signed)
[facility]) Care Management  05/05/2020  [name] 03/24/1941 [id number]   [facility] transfer of service  Pt transferred to  [facility] disease management/health coach services   Letter sent to primary care provider (PCP)  [facility] DM sent letter to patient  Joelene Millin L. Lavina Hamman, RN, BSN, [place] Coordinator Office number [phone] Main [facility] number [phone] Fax number [phone]

## 2020-05-07 ENCOUNTER — Other Ambulatory Visit: Payer: Self-pay | Admitting: *Deleted

## 2020-05-07 NOTE — Patient Outreach (Signed)
[facility]) Care Management  [place]  05/07/2020   MAESON PUROHIT 1941/03/10 [id number]   [place] Initial Assessment  Referral Date:  04/17/2020 Referral Source:  Transfer from [place] Reason for Referral:  Continued Disease Management Education Insurance:  Medicare   Outreach Attempt:  Outreach attempt #1 to patient for introduction and initial telephone assessment.  Patient answered and verified HIPAA.  RN Health Coach introduced self and role.  Patient verbally agrees to Disease Management outreaches.  Unable to complete initial telephone assessment at this time and requesting telephone call back another day and time.  Plan:  RN Health Coach will make another outreach attempt to complete initial telephone assessment within the month of October.   [place] [phone] [email or web address]

## 2020-05-25 ENCOUNTER — Other Ambulatory Visit: Payer: Self-pay | Admitting: *Deleted

## 2020-05-25 NOTE — Patient Outreach (Signed)
[name]) Care Management  [place]  05/25/2020   [name] Feb 07, 1941 [id number]   [name] Initial Assessment  Referral Date:  04/17/2020 Referral Source:  Transfer from [place] Reason for Referral:  Continued Disease Management Education Insurance:  Medicare   Outreach Attempt:  Outreach attempt #2 to patient for initial telephone assessment.  Male answered and stated patient just left home and not available at this time.   Plan:  RN Health Coach will make another outreach attempt within the month of November if no return call back from patient.   [place] [phone] [name]

## 2020-06-05 DIAGNOSIS — I10 Essential (primary) hypertension: Secondary | ICD-10-CM | POA: Diagnosis not present

## 2020-06-05 DIAGNOSIS — Z1152 Encounter for screening for COVID-19: Secondary | ICD-10-CM | POA: Diagnosis not present

## 2020-06-05 DIAGNOSIS — J069 Acute upper respiratory infection, unspecified: Secondary | ICD-10-CM | POA: Diagnosis not present

## 2020-06-05 DIAGNOSIS — R0981 Nasal congestion: Secondary | ICD-10-CM | POA: Diagnosis not present

## 2020-06-05 DIAGNOSIS — G4733 Obstructive sleep apnea (adult) (pediatric): Secondary | ICD-10-CM | POA: Diagnosis not present

## 2020-06-05 DIAGNOSIS — R059 Cough, unspecified: Secondary | ICD-10-CM | POA: Diagnosis not present

## 2020-06-05 DIAGNOSIS — J449 Chronic obstructive pulmonary disease, unspecified: Secondary | ICD-10-CM | POA: Diagnosis not present

## 2020-06-24 ENCOUNTER — Other Ambulatory Visit: Payer: Self-pay | Admitting: *Deleted

## 2020-06-24 NOTE — Patient Outreach (Signed)
[facility] [facility]) Care Management  [place]  06/24/2020   GUAGE EFFERSON 02/02/1941 [id number]   [place] Initial Assessment  Referral Date:04/17/2020 Referral Source:Transfer from [place] Reason for Referral:Continued Disease Management Education Insurance:Medicare   Outreach Attempt:  Outreach attempt #3 to patient for initial telephone assessment.  Patient answered and verified HIPAA.  Continues to consent to Disease Management outreaches, but states he is unable to complete initial assessment today as he has an appointment to get to.  Request call back another day and time.   Plan:  RN Health Coach will make another outreach attempt within the month of December per patients request.    Hubert Azure RN [place] [phone] [email or web address]

## 2020-06-29 DIAGNOSIS — H2513 Age-related nuclear cataract, bilateral: Secondary | ICD-10-CM | POA: Diagnosis not present

## 2020-06-29 DIAGNOSIS — H5203 Hypermetropia, bilateral: Secondary | ICD-10-CM | POA: Diagnosis not present

## 2020-07-16 ENCOUNTER — Other Ambulatory Visit: Payer: Self-pay

## 2020-07-16 ENCOUNTER — Ambulatory Visit (HOSPITAL_COMMUNITY)
Admission: EM | Admit: 2020-07-16 | Discharge: 2020-07-16 | Disposition: A | Discharge status: Home / Self Care | Payer: Medicare Other | Attending: Family Medicine | Admitting: Family Medicine

## 2020-07-16 ENCOUNTER — Encounter (HOSPITAL_COMMUNITY): Payer: Self-pay

## 2020-07-16 DIAGNOSIS — U071 COVID-19: Secondary | ICD-10-CM | POA: Diagnosis not present

## 2020-07-16 DIAGNOSIS — Z85118 Personal history of other malignant neoplasm of bronchus and lung: Secondary | ICD-10-CM | POA: Insufficient documentation

## 2020-07-16 DIAGNOSIS — R059 Cough, unspecified: Secondary | ICD-10-CM | POA: Diagnosis not present

## 2020-07-16 DIAGNOSIS — J441 Chronic obstructive pulmonary disease with (acute) exacerbation: Secondary | ICD-10-CM | POA: Insufficient documentation

## 2020-07-16 DIAGNOSIS — Z23 Encounter for immunization: Secondary | ICD-10-CM | POA: Diagnosis not present

## 2020-07-16 DIAGNOSIS — H6122 Impacted cerumen, left ear: Secondary | ICD-10-CM | POA: Diagnosis not present

## 2020-07-16 MED ORDER — PREDNISONE 10 MG PO TABS: ORAL_TABLET | ORAL | 0 refills | Status: DC

## 2020-07-16 NOTE — ED Triage Notes (Signed)
Pt presents with complaints of cough. Reports hx of lung cancer. States that he gets this cough every year. Patient states he has pain when he coughs. Pt is still seen at the cancer center and could not be seen today there.

## 2020-07-16 NOTE — ED Provider Notes (Signed)
[place]    CSN: [id number] Arrival date & time: 07/16/20  1540      History   Chief Complaint Chief Complaint  Patient presents with  . Cough    HPI [name] is a 79 y.o. male.   Presenting today with 2 day hx of hacking cough, mild wheezing, occasional SOB and chest tightness. Some low grade fevers in the evenings the past 2 nights as well. Denies CP, sore throat, congestion, body aches, abdominal pain, N/V/D. Hx of COPD, asthma and lung cancer s/p partial left lobectomy followed by Oncology. Taking symbicort daily and using albuterol nebs with good temporary relief. States he tends to get into a flare like this one just about every year. No new sick contacts, UTD on COVID vaccines.      Past Medical History:  Diagnosis Date  . Anxiety    pt. reports that he " could swing from the ......" because he is so anxious about the surgery   . Arthritis    R hand tendonitis   . COPD (chronic obstructive pulmonary disease) ([place])   . Depression   . Dyspnea   . History of kidney stones   . Hyperlipidemia   . lung ca dx'd 10/2016  . Pneumonia   . Sleep apnea    cannot tolerate cpap    Patient Active Problem List   Diagnosis Date Noted  . S/P partial lobectomy of lung 10/28/2019  . Nodule of upper lobe of right lung 10/08/2019  . Hypertension 09/30/2019  . Goals of care, counseling/discussion 06/25/2019  . Acute bronchitis 10/29/2018  . Influenza A 10/29/2018  . Acute respiratory failure with hypoxia ([place]) 10/29/2018  . Atherosclerosis of native artery of both lower extremities with intermittent claudication ([place]) 03/01/2018  . Former tobacco use 03/01/2018  . PVD (peripheral vascular disease) ([place]) 03/01/2018  . Stage I squamous cell carcinoma of left lung ([place]) 01/19/2017  . Mass of upper lobe of left lung 12/12/2016  . Lung nodule 11/07/2016  . Thrush 06/30/2013  . Sleep apnea 06/11/2008  . TOBACCO USER 04/09/2008  . ALLERGIC RHINITIS 04/09/2008  .  ASTHMA 04/09/2008  . COPD (chronic obstructive pulmonary disease) ([place]) 04/09/2008    Past Surgical History:  Procedure Laterality Date  . COLONOSCOPY W/ POLYPECTOMY    . cyst back  2011  . CYSTOSCOPY  2012   for bleeding in bladder x 3  . INTERCOSTAL NERVE BLOCK Right 10/28/2019   Procedure: Intercostal Nerve Block;  Surgeon: [name], MD;  Location: [facility];  Service: Thoracic;  Laterality: Right;  . MOUTH SURGERY    . NODE DISSECTION  10/28/2019   Procedure: Node Dissection;  Surgeon: [name], MD;  Location: [facility];  Service: Thoracic;;  . VIDEO ASSISTED THORACOSCOPY (VATS)/ LOBECTOMY Left 12/12/2016   Procedure: VIDEO ASSISTED THORACOSCOPY (VATS)/LEFT UPPER LOBECTOMY;  Surgeon: [name], MD;  Location: [facility];  Service: Thoracic;  Laterality: Left;  Marland Kitchen VIDEO BRONCHOSCOPY Right 10/28/2019   VIDEO BRONCHOSCOPY WITH ENDOBRONCHIAL NAVIGATION (N/A )   . VIDEO BRONCHOSCOPY WITH ENDOBRONCHIAL NAVIGATION N/A 10/28/2019   Procedure: VIDEO BRONCHOSCOPY WITH ENDOBRONCHIAL NAVIGATION;  Surgeon: [name], MD;  Location: [place] [facility];  Service: Thoracic;  Laterality: N/A;       Home Medications    Prior to Admission medications   Medication Sig Start Date End Date Taking? Authorizing Provider  acetaminophen (TYLENOL) 325 MG tablet Take 2 tablets (650 mg total) by mouth every 6 (six) hours  as needed for mild pain (or Fever >/= 101). 10/30/18   Elgergawy, Silver Huguenin, MD  albuterol (PROVENTIL) (2.5 MG/3ML) 0.083% nebulizer solution Take 3 mLs (2.5 mg total) by nebulization every 2 (two) hours as needed for wheezing. 10/30/18   Elgergawy, Silver Huguenin, MD  budesonide (PULMICORT) 1 MG/2ML nebulizer solution Take 1 mg by nebulization 2 (two) times daily. 01/22/20   [provider]  fexofenadine (ALLEGRA) 180 MG tablet Take 180 mg by mouth daily as needed for allergies or rhinitis.    [provider]  predniSONE (DELTASONE) 10 MG tablet Take 6 tabs day  one, 5 tabs day two, 4 tabs day three, etc 07/16/20   Volney American, PA-C  sertraline (ZOLOFT) 100 MG tablet Take 100 mg by mouth daily. 08/27/18   [provider]  simvastatin (ZOCOR) 20 MG tablet Take 20 mg by mouth every evening.    [provider]  traZODone (DESYREL) 50 MG tablet Take 50 mg by mouth at bedtime.  07/18/18   [provider]    Family History Family History  Problem Relation Age of Onset  . Colon cancer Brother   . Emphysema Other   . Heart disease Other   . Cancer Other   . Heart disease Father   . Heart disease Maternal Uncle   . Heart disease Paternal Uncle     Social History Social History   Tobacco Use  . Smoking status: Former Smoker    Packs/day: 0.50    Types: Cigarettes    Quit date: 11/29/2016    Years since quitting: 3.6  . Smokeless tobacco: Never Used  Vaping Use  . Vaping Use: Never used  Substance Use Topics  . Alcohol use: No    Comment: occasional beer  . Drug use: No     Allergies   Nsaids, Gadolinium derivatives, and Penicillins   Review of Systems Review of Systems PER HPI   Physical Exam Triage Vital Signs ED Triage Vitals  Enc Vitals Group     BP 07/16/20 1602 (!) 114/53     Pulse Rate 07/16/20 1602 82     Resp 07/16/20 1602 19     Temp 07/16/20 1602 99.5 F (37.5 C)     Temp src --      SpO2 07/16/20 1602 96 %     Weight --      Height --      Head Circumference --      Peak Flow --      Pain Score 07/16/20 1601 4     Pain Loc --      Pain Edu? --      Excl. in [place]? --    No data found.  Updated Vital Signs BP (!) 114/53   Pulse 82   Temp 99.5 F (37.5 C)   Resp 19   SpO2 96%   Visual Acuity Right Eye Distance:   Left Eye Distance:   Bilateral Distance:    Right Eye Near:   Left Eye Near:    Bilateral Near:     Physical Exam Vitals and nursing note reviewed.  Constitutional:      Appearance: Normal appearance.  HENT:     Head: Atraumatic.     Right Ear:  Tympanic membrane normal.     Ears:     Comments: Left EAC impacted with cerumen    Nose: Nose normal.     Mouth/Throat:     Mouth: Mucous membranes are moist.  Pharynx: Posterior oropharyngeal erythema present.  Eyes:     Extraocular Movements: Extraocular movements intact.     Conjunctiva/sclera: Conjunctivae normal.  Cardiovascular:     Rate and Rhythm: Normal rate and regular rhythm.  Pulmonary:     Effort: Pulmonary effort is normal.     Breath sounds: Wheezing (mild scattered, worst left lower) present. No rhonchi or rales.  Abdominal:     General: Bowel sounds are normal. There is no distension.     Palpations: Abdomen is soft.     Tenderness: There is no abdominal tenderness. There is no guarding.  Musculoskeletal:        General: Normal range of motion.     Cervical back: Normal range of motion and neck supple.  Skin:    General: Skin is warm and dry.  Neurological:     General: No focal deficit present.     Mental Status: He is oriented to person, place, and time.  Psychiatric:        Mood and Affect: Mood normal.        Thought Content: Thought content normal.        Judgment: Judgment normal.      UC Treatments / Results  Labs (all labs ordered are listed, but only abnormal results are displayed) Labs Reviewed  SARS CORONAVIRUS 2 (TAT 6-24 HRS)    EKG   Radiology No results found.  Procedures Procedures (including critical care time)  Medications Ordered in UC Medications - No data to display  Initial Impression / Assessment and Plan / UC Course  I have reviewed the triage vital signs and the nursing notes.  Pertinent labs & imaging results that were available during my care of the patient were reviewed by me and considered in my medical decision making (see chart for details).     O2 saturation 96% on room air, afebrile, in no distress today. Will start prednisone taper which he states typically takes good care of this type of flare,  continue home inhaler/neb tx's, mucinex, supportive care. Discussed strict return precautions. COVID test pending, isolate.  Lavage performed with partial success in removing cerumen impaction left ear. Debrox drops at home recommended to further remove impacted wax.   Final Clinical Impressions(s) / UC Diagnoses   Final diagnoses:  Cough  COPD exacerbation ([place])  History of lung cancer  Impacted cerumen of left ear   Discharge Instructions   None    ED Prescriptions    Medication Sig Dispense Auth. Provider   predniSONE (DELTASONE) 10 MG tablet Take 6 tabs day one, 5 tabs day two, 4 tabs day three, etc 21 tablet Volney American, [place]     PDMP not reviewed this encounter.   Volney American, [place] 07/16/20 [id number]

## 2020-07-17 LAB — SARS CORONAVIRUS 2 (TAT 6-24 HRS): SARS Coronavirus 2: POSITIVE — AB

## 2020-07-18 ENCOUNTER — Other Ambulatory Visit: Payer: Self-pay | Admitting: Nurse Practitioner

## 2020-07-18 DIAGNOSIS — R918 Other nonspecific abnormal finding of lung field: Secondary | ICD-10-CM

## 2020-07-18 DIAGNOSIS — I10 Essential (primary) hypertension: Secondary | ICD-10-CM

## 2020-07-18 DIAGNOSIS — U071 COVID-19: Secondary | ICD-10-CM

## 2020-07-18 DIAGNOSIS — F17200 Nicotine dependence, unspecified, uncomplicated: Secondary | ICD-10-CM

## 2020-07-18 NOTE — Progress Notes (Signed)
I connected by phone with [name] on 07/18/2020 at 5:50 PM to discuss the potential use of a new treatment for mild to moderate COVID-19 viral infection in non-hospitalized patients.  This patient is a 79 y.o. male that meets the FDA criteria for Emergency Use Authorization of COVID monoclonal antibody casirivimab/imdevimab, bamlanivimab/eteseviamb, or sotrovimab.  Has a (+) direct SARS-CoV-2 viral test result  Has mild or moderate COVID-19   Is NOT hospitalized due to COVID-19  Is within 10 days of symptom onset  Has at least one of the high risk factor(s) for progression to severe COVID-19 and/or hospitalization as defined in EUA.  Specific high risk criteria : Older age (>/= 81 yo), Immunosuppressive Disease or Treatment and Cardiovascular disease or hypertension   I have spoken and communicated the following to the patient or parent/caregiver regarding COVID monoclonal antibody treatment:  1. FDA has authorized the emergency use for the treatment of mild to moderate COVID-19 in adults and pediatric patients with positive results of direct SARS-CoV-2 viral testing who are 30 years of age and older weighing at least 40 kg, and who are at high risk for progressing to severe COVID-19 and/or hospitalization.  2. The significant known and potential risks and benefits of COVID monoclonal antibody, and the extent to which such potential risks and benefits are unknown.  3. Information on available alternative treatments and the risks and benefits of those alternatives, including clinical trials.  4. Patients treated with COVID monoclonal antibody should continue to self-isolate and use infection control measures (e.g., wear mask, isolate, social distance, avoid sharing personal items, clean and disinfect "high touch" surfaces, and frequent handwashing) according to CDC guidelines.   5. The patient or parent/caregiver has the option to accept or refuse COVID monoclonal antibody  treatment.  After reviewing this information with the patient, the patient has agreed to receive one of the available covid 19 monoclonal antibodies and will be provided an appropriate fact sheet prior to infusion. Jobe Gibbon, NP 07/18/2020 5:50 PM

## 2020-07-20 ENCOUNTER — Other Ambulatory Visit: Payer: Self-pay | Admitting: *Deleted

## 2020-07-20 NOTE — Patient Outreach (Signed)
[place] [facility]) Care Management  [place]  07/20/2020   BORA BRONER 20-Apr-1941 [id number]   [place] Initial Assessment  Referral Date:04/17/2020 Referral Source:Transfer from [place] Reason for Referral:Continued Disease Management Education Insurance:Medicare   Outreach Attempt:  Outreach attempt #4 to patient for initial telephone assessment.  Patient answered and verified HIPAA.  Patient reporting he tested positive for COVID last week.  Continues with cough, sore throat, and shortness of breath.  Audibly short of breath during conversation, but patient states he needs to take his nebulizer this morning.  Reports his wife is also positive for COVID and they both are scheduled to receive antibody infusions tomorrow at the hospital.  States his daughter that stays within them test is pending from this morning.  Patient requesting rescheduling of his initial telephone assessment to when he is feeling better and COVID free.  Encouraged patient to seek medical attention if his shortness of breath gets worse or he develops chest pains.  Plan:  RN Health Coach will make another telephone outreach to patient within the month of January to complete initial telephone assessment per patient's request.   Hubert Azure RN [place] [phone] [email or web address]

## 2020-07-21 ENCOUNTER — Ambulatory Visit (HOSPITAL_COMMUNITY)
Admission: RE | Admit: 2020-07-21 | Discharge: 2020-07-21 | Disposition: A | Discharge status: Home / Self Care | Payer: Medicare Other | Source: Ambulatory Visit | Attending: Pulmonary Disease | Admitting: Pulmonary Disease

## 2020-07-21 DIAGNOSIS — R918 Other nonspecific abnormal finding of lung field: Secondary | ICD-10-CM

## 2020-07-21 DIAGNOSIS — Z23 Encounter for immunization: Secondary | ICD-10-CM | POA: Insufficient documentation

## 2020-07-21 DIAGNOSIS — U071 COVID-19: Secondary | ICD-10-CM

## 2020-07-21 DIAGNOSIS — I10 Essential (primary) hypertension: Secondary | ICD-10-CM | POA: Diagnosis not present

## 2020-07-21 DIAGNOSIS — F17200 Nicotine dependence, unspecified, uncomplicated: Secondary | ICD-10-CM | POA: Diagnosis not present

## 2020-07-21 MED ORDER — EPINEPHRINE 0.3 MG/0.3ML IJ SOAJ: 0.3000 mg | Freq: Once | INTRAMUSCULAR | Status: DC | PRN

## 2020-07-21 MED ORDER — SODIUM CHLORIDE 0.9 % IV SOLN: INTRAVENOUS | Status: DC | PRN

## 2020-07-21 MED ORDER — FAMOTIDINE IN NACL 20-0.9 MG/50ML-% IV SOLN: 20.0000 mg | Freq: Once | INTRAVENOUS | Status: DC | PRN

## 2020-07-21 MED ORDER — ALBUTEROL SULFATE HFA 108 (90 BASE) MCG/ACT IN AERS: 2.0000 | INHALATION_SPRAY | Freq: Once | RESPIRATORY_TRACT | Status: DC | PRN

## 2020-07-21 MED ORDER — DIPHENHYDRAMINE HCL 50 MG/ML IJ SOLN: 50.0000 mg | Freq: Once | INTRAMUSCULAR | Status: DC | PRN

## 2020-07-21 MED ORDER — METHYLPREDNISOLONE SODIUM SUCC 125 MG IJ SOLR: 125.0000 mg | Freq: Once | INTRAMUSCULAR | Status: DC | PRN

## 2020-07-21 MED ORDER — SODIUM CHLORIDE 0.9 % IV SOLN: Freq: Once | INTRAVENOUS | Status: AC

## 2020-07-21 NOTE — Discharge Instructions (Signed)
10 Things You Can Do to Manage Your COVID-19 Symptoms at Home If you have possible or confirmed COVID-19: 1. Stay home from work and school. And stay away from other public places. If you must go out, avoid using any kind of public transportation, ridesharing, or taxis. 2. Monitor your symptoms carefully. If your symptoms get worse, call your healthcare provider immediately. 3. Get rest and stay hydrated. 4. If you have a medical appointment, call the healthcare provider ahead of time and tell them that you have or may have COVID-19. 5. For medical emergencies, call 911 and notify the dispatch personnel that you have or may have COVID-19. 6. Cover your cough and sneezes with a tissue or use the inside of your elbow. 7. Wash your hands often with soap and water for at least 20 seconds or clean your hands with an alcohol-based hand sanitizer that contains at least 60% alcohol. 8. As much as possible, stay in a specific room and away from other people in your home. Also, you should use a separate bathroom, if available. If you need to be around other people in or outside of the home, wear a mask. 9. Avoid sharing personal items with other people in your household, like dishes, towels, and bedding. 10. Clean all surfaces that are touched often, like counters, tabletops, and doorknobs. Use household cleaning sprays or wipes according to the label instructions. cdc.gov/coronavirus 02/13/2019 This information is not intended to replace advice given to you by your health care provider. Make sure you discuss any questions you have with your health care provider. Document Revised: 07/18/2019 Document Reviewed: 07/18/2019 Elsevier Patient Education  2020 Elsevier Inc. What types of side effects do monoclonal antibody drugs cause?  Common side effects  In general, the more common side effects caused by monoclonal antibody drugs include: . Allergic reactions, such as hives or itching . Flu-like signs and  symptoms, including chills, fatigue, fever, and muscle aches and pains . Nausea, vomiting . Diarrhea . Skin rashes . Low blood pressure   The CDC is recommending patients who receive monoclonal antibody treatments wait at least 90 days before being vaccinated.  Currently, there are no data on the safety and efficacy of mRNA COVID-19 vaccines in persons who received monoclonal antibodies or convalescent plasma as part of COVID-19 treatment. Based on the estimated half-life of such therapies as well as evidence suggesting that reinfection is uncommon in the 90 days after initial infection, vaccination should be deferred for at least 90 days, as a precautionary measure until additional information becomes available, to avoid interference of the antibody treatment with vaccine-induced immune responses. If you have any questions or concerns after the infusion please call the Advanced Practice Provider on call at [phone]. This number is ONLY intended for your use regarding questions or concerns about the infusion post-treatment side-effects.  Please do not provide this number to others for use. For return to work notes please contact your primary care provider.   If someone you know is interested in receiving treatment please have them call the COVID hotline at [phone].   

## 2020-07-21 NOTE — Progress Notes (Signed)
Patient reviewed Fact Sheet for Patients, Parents, and Caregivers for Emergency Use Authorization (EUA) of Casi/imdevimab for the Treatment of Coronavirus. Patient also reviewed and is agreeable to the estimated cost of treatment. Patient is agreeable to proceed.

## 2020-07-21 NOTE — Progress Notes (Signed)
  Diagnosis: COVID-19  Physician: Dr. Joya Gaskins   Procedure: Covid Infusion Clinic Med: casirivimab\imdevimab infusion - Provided patient with casirivimab\imdevimab fact sheet for patients, parents and caregivers prior to infusion.  Complications: No immediate complications noted.  Discharge: Discharged home   [name] 07/21/2020

## 2020-08-20 ENCOUNTER — Other Ambulatory Visit: Payer: Self-pay | Admitting: *Deleted

## 2020-08-21 ENCOUNTER — Encounter: Payer: Self-pay | Admitting: *Deleted

## 2020-08-21 NOTE — Patient Instructions (Signed)
Goals Addressed              This Visit's Progress     Patient Stated   .  [facility]) Patient will be able to manage his COPD at home (pt-stated)   On track     [place] (see longitudinal plan of care for additional care plan information)  Current Barriers:  Marland Kitchen Knowledge deficits related to basic COPD self care/management . Cognitive Deficits   Case Manager Clinical Goal(s):  Over the next 90 days, patient will be able to verbalize understanding of COPD action plan and when to seek appropriate levels of medical care  Over the next 90 days, patient will engage in lite exercise as tolerated to build/regain stamina and strength and reduce shortness of breath through activity tolerance  Over the next 90 days, patient will not be hospitalized for COPD exacerbation   Interventions:   Provided patient with basic written and verbal COPD education on self care/management/and exacerbation prevention   Provided patient with COPD action plan and reinforced importance of daily self assessment  Provided instruction about proper use of medications used for management of COPD including inhalers  Advised patient to self assesses COPD action plan zone and make appointment with provider if in the yellow zone for 48 hours without improvement.  Patient previously received [facility] calendar booklet which has COPD zones and action plans.   Patient Self Care Activities:  . Takes medications as prescribed including inhalers . Self assesses COPD action plan zone and makes appointment with provider if in the yellow zone for 48 hours without improvement. . Engages in light exercise 3-5 days a week . Utilizes infection prevention strategies to reduce risk of respiratory infection  . Patient reports being in close contact wit his PCP  with any worsening COPD symptoms or signs of infection. . Patient reports that he walks his dog to increase his physical activity. Currently he is working towards building  back up to his previous distance of going around the block due to currently recovering from having covid.   Please see past updates related to this goal by clicking on the "Past Updates" button in the selected goal   Timeframe:  Long-Range Goal Priority:  High Start Date:  05/05/20                           Expected End Date:  02/11/21 Follow up: 12/12/20 Updated by [name] RN, BSN: 08/20/20                          Other   .  Track and Manage My Symptoms-COPD   On track     Timeframe:  Long-Range Goal Priority:  High Start Date:  08/20/20                           Expected End Date:  02/11/21                     Follow Up Date 12/12/20    - develop a rescue plan - eliminate symptom triggers at home - follow rescue plan if symptoms flare-up - keep follow-up appointments    Why is this important?    Tracking your symptoms and other information about your health helps your doctor plan your care.   Write down the symptoms, the time of day, what you were  doing and what medicine you are taking.   You will soon learn how to manage your symptoms.     Notes: Patient reports being in close contact with his PCP with any worsening COPD symptoms.       Mr. Kohan,   Below are dentist who practice in [place]. All have high quality of care ratings. I hope this will help to assist you with selecting a dentist.   Mirna Mires DDS Dentist in [place], [place] Address:  [address] [place], [place] [address] Hours:  Closed ? Tobie Poet Fri Phone: [phone]   [facility] Dental clinic in [place], [place] Address:  [address], [place], [place] [address] Hours:  Closes soon ? 5PM ? Corrinne Eagle Phone: [phone] Appointments: [email or web address]  Dorann Lodge, DDS Dentist in [place], [place]  Address:  [address], [place], [place] [address] Hours:  Closes soon ? 4:30PM ? Caesar Bookman Phone:  [phone]  [facility] Dentist in [place], [place] Address:  [address] [address], [place], [place] [address] Hours:  Closes soon ? 5PM ? Tobie Poet Fri Health & safety: Appointment required  Mask required  Temperature check required  Staff wear masks  Staff get temperature checks  Staff required to disinfect surfaces between visits  More details Products and Services: [email or web address] Phone: [phone]

## 2020-08-21 NOTE — Patient Outreach (Signed)
[facility]) Care Management  [place]  08/20/20  [name] 1941-02-11 [id number]  Subjective: Successful telephone outreach call to patient. HIPAA identifiers obtained. Patient reports that he is recovering from covid and he is slowly beginning to feel better. He did go to [facility] and received monoclonal antibodies. Patient explains that he completed his course of antibiotics on 08/16/20 and he continues to cough up a lot of phlegm. He states that he continues to be short of breath and his oxygen saturation does drop to the mid 80's with exertion but will quickly bump back up to the 90's with rest and deep breathing which is an improvement from previous. Patient stated that he is going to call his PCP today to inform him of his continued respiratory symptoms. Patient feels that he has slowly improved and is starting to recover but wants to ensure that his PCP does not want him to continue to take antibiotics. He is starting to walk his dog outside and plans to build back up to walking his dog daily around the block. He reports that his appetite is good and he is staying hydrated. He is primarily using his symbicort controlled inhaler now but when needed will use his rescue inhaler and nebulizer. Patient's goal is to recover from covid until he is back to his previous baseline of walking routinely and going out as needed or as wanted to run errands and enjoy life. Patient states that he has not had any recent falls, denies any needs for DME, and explains he is very well supported by his wife [name] and family. Patient did not have any further questions or concerns today.   Encounter Medications:  Outpatient Encounter Medications as of 08/20/2020  Medication Sig  . acetaminophen (TYLENOL) 325 MG tablet Take 2 tablets (650 mg total) by mouth every 6 (six) hours as needed for mild pain (or Fever >/= 101).  Marland Kitchen albuterol (PROVENTIL) (2.5 MG/3ML) 0.083% nebulizer solution  Take 3 mLs (2.5 mg total) by nebulization every 2 (two) hours as needed for wheezing.  . budesonide (PULMICORT) 1 MG/2ML nebulizer solution Take 1 mg by nebulization 2 (two) times daily.  . fexofenadine (ALLEGRA) 180 MG tablet Take 180 mg by mouth daily as needed for allergies or rhinitis.  . predniSONE (DELTASONE) 10 MG tablet Take 6 tabs day one, 5 tabs day two, 4 tabs day three, etc  . sertraline (ZOLOFT) 100 MG tablet Take 100 mg by mouth daily.  . simvastatin (ZOCOR) 20 MG tablet Take 20 mg by mouth every evening.  . SYMBICORT 160-4.5 MCG/ACT inhaler SMARTSIG:2 Puff(s) By Mouth Twice Daily  . traZODone (DESYREL) 50 MG tablet Take 50 mg by mouth at bedtime.    No facility-administered encounter medications on file as of 08/20/2020.    Functional Status:  In your present state of health, do you have any difficulty performing the following activities: 02/03/2020 11/13/2019  Hearing? N N  Comment - -  Vision? N N  Difficulty concentrating or making decisions? N N  Walking or climbing stairs? Y Y  Dressing or bathing? N N  Doing errands, shopping? [name]  Preparing Food and eating ? N N  Using the Toilet? N N  In the past six months, have you accidently leaked urine? N N  Do you have problems with loss of bowel control? N N  Managing your Medications? N N  Managing your Finances? N N  Housekeeping or managing your Housekeeping? [name]  Some recent data might be hidden    Fall/Depression Screening: Fall Risk  08/20/2020 11/21/2019  Falls in the past year? 0 0  Number falls in past yr: 0 0  Injury with Fall? 0 0  Follow up - Falls evaluation completed;Falls prevention discussed   PHQ 2/9 Scores 08/21/2020 02/03/2020 01/15/2020 11/13/2019  PHQ - 2 Score 0 0 0 0    Assessment:  Goals Addressed              This Visit's Progress     Patient Stated   .  [facility]) Patient will be able to manage his COPD at home (pt-stated)   On track     [place] (see longitudinal plan of care for  additional care plan information)  Current Barriers:  Marland Kitchen Knowledge deficits related to basic COPD self care/management . Cognitive Deficits   Case Manager Clinical Goal(s):  Over the next 90 days, patient will be able to verbalize understanding of COPD action plan and when to seek appropriate levels of medical care  Over the next 90 days, patient will engage in lite exercise as tolerated to build/regain stamina and strength and reduce shortness of breath through activity tolerance  Over the next 90 days, patient will not be hospitalized for COPD exacerbation   Interventions:   Provided patient with basic written and verbal COPD education on self care/management/and exacerbation prevention   Provided patient with COPD action plan and reinforced importance of daily self assessment  Provided instruction about proper use of medications used for management of COPD including inhalers  Advised patient to self assesses COPD action plan zone and make appointment with provider if in the yellow zone for 48 hours without improvement.  Patient previously received [facility] calendar booklet which has COPD zones and action plans.   Patient Self Care Activities:  . Takes medications as prescribed including inhalers . Self assesses COPD action plan zone and makes appointment with provider if in the yellow zone for 48 hours without improvement. . Engages in light exercise 3-5 days a week . Utilizes infection prevention strategies to reduce risk of respiratory infection  . Patient reports being in close contact wit his PCP  with any worsening COPD symptoms or signs of infection. . Patient reports that he walks his dog to increase his physical activity. Currently he is working towards building back up to his previous distance of going around the block due to currently recovering from having covid.   Please see past updates related to this goal by clicking on the "Past Updates" button in the selected goal    Timeframe:  Long-Range Goal Priority:  High Start Date:  05/05/20                           Expected End Date:  02/11/21 Follow up: 12/12/20 Updated by [name] RN, BSN: 08/20/20                          Other   .  Track and Manage My Symptoms-COPD   On track     Timeframe:  Long-Range Goal Priority:  High Start Date:  08/20/20                           Expected End Date:  02/11/21  Follow Up Date 12/12/20    - develop a rescue plan - eliminate symptom triggers at home - follow rescue plan if symptoms flare-up - keep follow-up appointments    Why is this important?    Tracking your symptoms and other information about your health helps your doctor plan your care.   Write down the symptoms, the time of day, what you were doing and what medicine you are taking.   You will soon learn how to manage your symptoms.     Notes: Patient reports being in close contact with his PCP with any worsening COPD symptoms.        Plan: RN Health Coach will send PCP a barrier letter and today's assessment note and will call patient within the month of April. Follow-up:  Patient agrees to Care Plan and Follow-up.    [name] RN, BSN [place] [phone] [email or web address]

## 2020-09-24 DIAGNOSIS — Z125 Encounter for screening for malignant neoplasm of prostate: Secondary | ICD-10-CM | POA: Diagnosis not present

## 2020-09-24 DIAGNOSIS — E785 Hyperlipidemia, unspecified: Secondary | ICD-10-CM | POA: Diagnosis not present

## 2020-10-01 DIAGNOSIS — G4733 Obstructive sleep apnea (adult) (pediatric): Secondary | ICD-10-CM | POA: Diagnosis not present

## 2020-10-01 DIAGNOSIS — C3490 Malignant neoplasm of unspecified part of unspecified bronchus or lung: Secondary | ICD-10-CM | POA: Diagnosis not present

## 2020-10-01 DIAGNOSIS — Z0000 Encounter for general adult medical examination without abnormal findings: Secondary | ICD-10-CM | POA: Diagnosis not present

## 2020-10-01 DIAGNOSIS — F329 Major depressive disorder, single episode, unspecified: Secondary | ICD-10-CM | POA: Diagnosis not present

## 2020-10-01 DIAGNOSIS — Z23 Encounter for immunization: Secondary | ICD-10-CM | POA: Diagnosis not present

## 2020-10-01 DIAGNOSIS — Z1152 Encounter for screening for COVID-19: Secondary | ICD-10-CM | POA: Diagnosis not present

## 2020-10-01 DIAGNOSIS — R82998 Other abnormal findings in urine: Secondary | ICD-10-CM | POA: Diagnosis not present

## 2020-10-01 DIAGNOSIS — R109 Unspecified abdominal pain: Secondary | ICD-10-CM | POA: Diagnosis not present

## 2020-10-01 DIAGNOSIS — J449 Chronic obstructive pulmonary disease, unspecified: Secondary | ICD-10-CM | POA: Diagnosis not present

## 2020-10-01 DIAGNOSIS — R0901 Asphyxia: Secondary | ICD-10-CM | POA: Diagnosis not present

## 2020-10-01 DIAGNOSIS — R202 Paresthesia of skin: Secondary | ICD-10-CM | POA: Diagnosis not present

## 2020-10-01 DIAGNOSIS — I10 Essential (primary) hypertension: Secondary | ICD-10-CM | POA: Diagnosis not present

## 2020-10-01 DIAGNOSIS — E669 Obesity, unspecified: Secondary | ICD-10-CM | POA: Diagnosis not present

## 2020-11-19 ENCOUNTER — Other Ambulatory Visit: Payer: Self-pay | Admitting: *Deleted

## 2020-11-19 NOTE — Patient Outreach (Signed)
[facility]) Care Management  11/19/2020  DORANCE SPINK 02-08-1941 [id number]  Unsuccessful outreach attempt made to patient. Patient answered the phone and stated that he would not be able to speak today. He did request that this nurse call back at a later date.   Plan: RN Health Coach will call patient within the month of May.  [name] RN, BSN [place] [phone] [email or web address]

## 2020-12-16 ENCOUNTER — Other Ambulatory Visit: Payer: Self-pay | Admitting: *Deleted

## 2020-12-16 NOTE — Patient Outreach (Signed)
[facility]) Care Management  [place]  12/16/2020   [name] Nov 10, 1940 [id number]  Subjective: Successful telephone outreach call to patient. HIPAA identifiers obtained. Patient states he has not been feeling well. Patient reports that he feels fatigued and that he continues to feel short of breath since he had Covid. Patient shared that he is not taking his Symbicort inhaler or his rescue nebulizer as prescribed. Nurse discussed with patient that his breathing may greatly improve if he consistently continues to take his prescribed COPD medication daily and as prescribed. Patient verbalized understanding and admits that he stops taking his medication routinely when he starts to feel better. Nurse voiced compassion that having a chronic disease is difficult and also informed the patient that his chronic lung condition will become symptomatic if he is not taking his medication as prescribed daily. Patient stated he will begin to take his Symbicort twice daily. He also explained that his rescue nebulizer medication is outdated and he plans to call Dr. Danna Hefty office to get this prescription refilled when we end our conversation. Nurse will send patient COPD education that includes color zones and rescue action plans. Patient did not have any further questions or concerns today and did confirm that he has this nurse's contact number to call her if needed.   Encounter Medications:  Outpatient Encounter Medications as of 12/16/2020  Medication Sig Note  . acetaminophen (TYLENOL) 325 MG tablet Take 2 tablets (650 mg total) by mouth every 6 (six) hours as needed for mild pain (or Fever >/= 101).   . fexofenadine (ALLEGRA) 180 MG tablet Take 180 mg by mouth daily as needed for allergies or rhinitis.   Marland Kitchen sertraline (ZOLOFT) 100 MG tablet Take 100 mg by mouth daily.   . simvastatin (ZOCOR) 20 MG tablet Take 20 mg by mouth every evening.   . SYMBICORT 160-4.5 MCG/ACT inhaler  SMARTSIG:2 Puff(s) By Mouth Twice Daily 12/16/2020: Reports not taking as prescribed twice daily, nurse provided education  . traZODone (DESYREL) 50 MG tablet Take 50 mg by mouth at bedtime.    Marland Kitchen albuterol (PROVENTIL) (2.5 MG/3ML) 0.083% nebulizer solution Take 3 mLs (2.5 mg total) by nebulization every 2 (two) hours as needed for wheezing. (Patient not taking: Reported on 12/16/2020) 12/16/2020: States he is not using, nurse provided education  . budesonide (PULMICORT) 1 MG/2ML nebulizer solution Take 1 mg by nebulization 2 (two) times daily. (Patient not taking: Reported on 12/16/2020) 12/16/2020: States he is not taking, completed  . predniSONE (DELTASONE) 10 MG tablet Take 6 tabs day one, 5 tabs day two, 4 tabs day three, etc (Patient not taking: Reported on 12/16/2020) 12/16/2020: Completed   No facility-administered encounter medications on file as of 12/16/2020.    Functional Status:  In your present state of health, do you have any difficulty performing the following activities: 02/03/2020  Hearing? N  Vision? N  Difficulty concentrating or making decisions? N  Walking or climbing stairs? Y  Dressing or bathing? N  Doing errands, shopping? Y  Preparing Food and eating ? N  Using the Toilet? N  In the past six months, have you accidently leaked urine? N  Do you have problems with loss of bowel control? N  Managing your Medications? N  Managing your Finances? N  Housekeeping or managing your Housekeeping? Y  Some recent data might be hidden    Fall/Depression Screening: Fall Risk  12/16/2020 08/20/2020 11/21/2019  Falls in the past year? 0 0 0  Number falls in past yr: 0 0 0  Injury with Fall? 0 0 0  Follow up - - Falls evaluation completed;Falls prevention discussed   PHQ 2/9 Scores 08/21/2020 02/03/2020 01/15/2020 11/13/2019  PHQ - 2 Score 0 0 0 0    Assessment:  Goals Addressed              This Visit's Progress     Patient Stated   .  COMPLETED: [facility]) Patient will be able to manage his  COPD at home (pt-stated)        [place] (see longitudinal plan of care for additional care plan information)  Current Barriers:  Marland Kitchen Knowledge deficits related to basic COPD self care/management . Cognitive Deficits   Case Manager Clinical Goal(s):  Over the next 90 days, patient will be able to verbalize understanding of COPD action plan and when to seek appropriate levels of medical care  Over the next 90 days, patient will engage in lite exercise as tolerated to build/regain stamina and strength and reduce shortness of breath through activity tolerance  Over the next 90 days, patient will not be hospitalized for COPD exacerbation   Interventions:   Provided patient with basic written and verbal COPD education on self care/management/and exacerbation prevention   Provided patient with COPD action plan and reinforced importance of daily self assessment  Provided instruction about proper use of medications used for management of COPD including inhalers  Advised patient to self assesses COPD action plan zone and make appointment with provider if in the yellow zone for 48 hours without improvement.  Patient previously received [facility] calendar booklet which has COPD zones and action plans.   Patient Self Care Activities:  . Takes medications as prescribed including inhalers . Self assesses COPD action plan zone and makes appointment with provider if in the yellow zone for 48 hours without improvement. . Engages in light exercise 3-5 days a week . Utilizes infection prevention strategies to reduce risk of respiratory infection  . Patient reports being in close contact wit his PCP  with any worsening COPD symptoms or signs of infection. . Patient reports that he walks his dog to increase his physical activity. Currently he is working towards building back up to his previous distance of going around the block due to currently recovering from having covid.   Please see past updates  related to this goal by clicking on the "Past Updates" button in the selected goal   Timeframe:  Long-Range Goal Priority:  High Start Date:  05/05/20                           Expected End Date:  02/11/21 Follow up: 12/12/20 Updated by [name] RN, BSN: 08/20/20                     12/16/20: Resolved due to duplicate goals      Other   .  [facility]) Patient will verbalized understanding about how to track and manage his COPD within the next 90 days        Timeframe:  Long-Range Goal Priority:  High Start Date:  08/20/20                           Expected End Date:  09/13/21                    Follow  Up Date 03/13/21    - develop a rescue plan - eliminate symptom triggers at home - follow rescue plan if symptoms flare-up - keep follow-up appointments  -Encouraged Patient to take controlled inhaler Symbicort twice daily -Encouraged patient to take his rescue inhaler as needed when he feels like his Symbicort inhaler is not working and he becomes increasingly short of breath -Discussed COPD rescue plans and will send COPD education with color zones and rescue action plans   Why is this important?    Tracking your symptoms and other information about your health helps your doctor plan your care.   Write down the symptoms, the time of day, what you were doing and what medicine you are taking.   You will soon learn how to manage your symptoms.     Notes: Patient reports being in close contact with his PCP with any worsening COPD symptoms.   Updated 12/16/20: Patient reports feeling fatigued and has felt short of breath. Patient states that he has not been taking his Symbicort inhaler or his rescue nebulizer as prescribed. Nurse provided education and will send patient COPD education that includes color zones and rescue action plans. Patient states he is going to call his PCP to request a refill for his rescue nebulizer because it is outdated. He will call Dr. Janee Morn office to schedule an office  visit as it has been a long time since he has seen his pulmonologist. Patient reports that he wants to feel better and get back to walking his dog around the block like he was doing previously.      Plan: RN Health Coach will send PCP today's assessment note, will send patient COPD education, and will call patient within the month of June. Follow-up:  Patient agrees to Care Plan and Follow-up.    [name] RN, BSN [place] [phone] [email or web address]

## 2020-12-16 NOTE — Patient Instructions (Addendum)
Goals Addressed              This Visit's Progress     Patient Stated   .  COMPLETED: [facility]) Patient will be able to manage his COPD at home (pt-stated)        [place] (see longitudinal plan of care for additional care plan information)  Current Barriers:  Marland Kitchen Knowledge deficits related to basic COPD self care/management . Cognitive Deficits   Case Manager Clinical Goal(s):  Over the next 90 days, patient will be able to verbalize understanding of COPD action plan and when to seek appropriate levels of medical care  Over the next 90 days, patient will engage in lite exercise as tolerated to build/regain stamina and strength and reduce shortness of breath through activity tolerance  Over the next 90 days, patient will not be hospitalized for COPD exacerbation   Interventions:   Provided patient with basic written and verbal COPD education on self care/management/and exacerbation prevention   Provided patient with COPD action plan and reinforced importance of daily self assessment  Provided instruction about proper use of medications used for management of COPD including inhalers  Advised patient to self assesses COPD action plan zone and make appointment with provider if in the yellow zone for 48 hours without improvement.  Patient previously received [facility] calendar booklet which has COPD zones and action plans.   Patient Self Care Activities:  . Takes medications as prescribed including inhalers . Self assesses COPD action plan zone and makes appointment with provider if in the yellow zone for 48 hours without improvement. . Engages in light exercise 3-5 days a week . Utilizes infection prevention strategies to reduce risk of respiratory infection  . Patient reports being in close contact wit his PCP  with any worsening COPD symptoms or signs of infection. . Patient reports that he walks his dog to increase his physical activity. Currently he is working towards building  back up to his previous distance of going around the block due to currently recovering from having covid.   Please see past updates related to this goal by clicking on the "Past Updates" button in the selected goal   Timeframe:  Long-Range Goal Priority:  High Start Date:  05/05/20                           Expected End Date:  02/11/21 Follow up: 12/12/20 Updated by [name] RN, BSN: 08/20/20                     12/16/20: Resolved due to duplicate goals      Other   .  [facility]) Patient will verbalized understanding about how to track and manage his COPD within the next 90 days        Timeframe:  Long-Range Goal Priority:  High Start Date:  08/20/20                           Expected End Date:  09/13/21                    Follow Up Date 03/13/21    - develop a rescue plan - eliminate symptom triggers at home - follow rescue plan if symptoms flare-up - keep follow-up appointments  -Encouraged Patient to take controlled inhaler Symbicort twice daily -Encouraged patient to take his rescue inhaler as needed when he  feels like his Symbicort inhaler is not working and he becomes increasingly short of breath -Discussed COPD rescue plans and will send COPD education with color zones and rescue action plans   Why is this important?    Tracking your symptoms and other information about your health helps your doctor plan your care.   Write down the symptoms, the time of day, what you were doing and what medicine you are taking.   You will soon learn how to manage your symptoms.     Notes: Patient reports being in close contact with his PCP with any worsening COPD symptoms.   Updated 12/16/20: Patient reports feeling fatigued and has felt short of breath. Patient states that he has not been taking his Symbicort inhaler or his rescue nebulizer as prescribed. Nurse provided education and will send patient COPD education that includes color zones and rescue action plans. Patient states he is going to call  his PCP to request a refill for his rescue nebulizer because it is outdated. He will call Dr. Janee Morn office to schedule an office visit as it has been a long time since he has seen his pulmonologist. Patient reports that he wants to feel better and get back to walking his dog around the block like he was doing previously.

## 2021-01-26 ENCOUNTER — Inpatient Hospital Stay: Payer: Medicare Other | Attending: Internal Medicine

## 2021-01-26 ENCOUNTER — Ambulatory Visit (HOSPITAL_COMMUNITY)
Admission: RE | Admit: 2021-01-26 | Discharge: 2021-01-26 | Disposition: A | Discharge status: Home / Self Care | Payer: Medicare Other | Source: Ambulatory Visit | Attending: Internal Medicine | Admitting: Internal Medicine

## 2021-01-26 ENCOUNTER — Encounter (HOSPITAL_COMMUNITY): Payer: Self-pay

## 2021-01-26 ENCOUNTER — Other Ambulatory Visit: Payer: Self-pay

## 2021-01-26 DIAGNOSIS — C3490 Malignant neoplasm of unspecified part of unspecified bronchus or lung: Secondary | ICD-10-CM | POA: Insufficient documentation

## 2021-01-26 DIAGNOSIS — Z902 Acquired absence of lung [part of]: Secondary | ICD-10-CM | POA: Insufficient documentation

## 2021-01-26 DIAGNOSIS — I7789 Other specified disorders of arteries and arterioles: Secondary | ICD-10-CM | POA: Diagnosis not present

## 2021-01-26 DIAGNOSIS — J439 Emphysema, unspecified: Secondary | ICD-10-CM | POA: Diagnosis not present

## 2021-01-26 DIAGNOSIS — Z85118 Personal history of other malignant neoplasm of bronchus and lung: Secondary | ICD-10-CM | POA: Diagnosis not present

## 2021-01-26 DIAGNOSIS — Z8616 Personal history of COVID-19: Secondary | ICD-10-CM | POA: Insufficient documentation

## 2021-01-26 DIAGNOSIS — I2510 Atherosclerotic heart disease of native coronary artery without angina pectoris: Secondary | ICD-10-CM | POA: Diagnosis not present

## 2021-01-26 LAB — CBC WITH DIFFERENTIAL (CANCER CENTER ONLY)
Abs Immature Granulocytes: 0.13 10*3/uL — ABNORMAL HIGH (ref 0.00–0.07)
Basophils Absolute: 0.1 10*3/uL (ref 0.0–0.1)
Basophils Relative: 1 %
Eosinophils Absolute: 0.3 10*3/uL (ref 0.0–0.5)
Eosinophils Relative: 3 %
HCT: 45.9 % (ref 39.0–52.0)
Hemoglobin: 15.9 g/dL (ref 13.0–17.0)
Immature Granulocytes: 1 %
Lymphocytes Relative: 9 %
Lymphs Abs: 0.9 10*3/uL (ref 0.7–4.0)
MCH: 31.8 pg (ref 26.0–34.0)
MCHC: 34.6 g/dL (ref 30.0–36.0)
MCV: 91.8 fL (ref 80.0–100.0)
Monocytes Absolute: 1.1 10*3/uL — ABNORMAL HIGH (ref 0.1–1.0)
Monocytes Relative: 12 %
Neutro Abs: 7.4 10*3/uL (ref 1.7–7.7)
Neutrophils Relative %: 74 %
Platelet Count: 281 10*3/uL (ref 150–400)
RBC: 5 MIL/uL (ref 4.22–5.81)
RDW: 13.6 % (ref 11.5–15.5)
WBC Count: 9.9 10*3/uL (ref 4.0–10.5)
nRBC: 0 % (ref 0.0–0.2)

## 2021-01-26 LAB — CMP (CANCER CENTER ONLY)
ALT: 15 U/L (ref 0–44)
AST: 13 U/L — ABNORMAL LOW (ref 15–41)
Albumin: 3.6 g/dL (ref 3.5–5.0)
Alkaline Phosphatase: 78 U/L (ref 38–126)
Anion gap: 9 (ref 5–15)
BUN: 13 mg/dL (ref 8–23)
CO2: 28 mmol/L (ref 22–32)
Calcium: 9.7 mg/dL (ref 8.9–10.3)
Chloride: 103 mmol/L (ref 98–111)
Creatinine: 0.74 mg/dL (ref 0.61–1.24)
GFR, Estimated: >60 mL/min (ref >60)
Glucose, Bld: 102 mg/dL — ABNORMAL HIGH (ref 70–99)
Potassium: 4.6 mmol/L (ref 3.5–5.1)
Sodium: 140 mmol/L (ref 135–145)
Total Bilirubin: 0.5 mg/dL (ref 0.3–1.2)
Total Protein: 7 g/dL (ref 6.5–8.1)

## 2021-01-26 MED ORDER — IOHEXOL 300 MG/ML  SOLN
75.0000 mL | Freq: Once | INTRAMUSCULAR | Status: AC | PRN
  Administered 2021-01-26: 12:00:00 75 mL via INTRAVENOUS

## 2021-01-26 MED ORDER — SODIUM CHLORIDE (PF) 0.9 % IJ SOLN
INTRAMUSCULAR | Status: AC
  Filled 2021-01-26: qty 50

## 2021-01-28 ENCOUNTER — Inpatient Hospital Stay (HOSPITAL_BASED_OUTPATIENT_CLINIC_OR_DEPARTMENT_OTHER): Payer: Medicare Other | Admitting: Internal Medicine

## 2021-01-28 ENCOUNTER — Other Ambulatory Visit: Payer: Self-pay

## 2021-01-28 VITALS — BP 150/71 | HR 68 | Temp 96.7°F | Resp 20 | Ht 66.0 in | Wt 183.8 lb

## 2021-01-28 DIAGNOSIS — C3492 Malignant neoplasm of unspecified part of left bronchus or lung: Secondary | ICD-10-CM | POA: Diagnosis not present

## 2021-01-28 DIAGNOSIS — C3490 Malignant neoplasm of unspecified part of unspecified bronchus or lung: Secondary | ICD-10-CM | POA: Diagnosis not present

## 2021-01-28 DIAGNOSIS — Z902 Acquired absence of lung [part of]: Secondary | ICD-10-CM | POA: Diagnosis not present

## 2021-01-28 DIAGNOSIS — Z8616 Personal history of COVID-19: Secondary | ICD-10-CM | POA: Diagnosis not present

## 2021-01-28 DIAGNOSIS — Z85118 Personal history of other malignant neoplasm of bronchus and lung: Secondary | ICD-10-CM | POA: Diagnosis not present

## 2021-01-28 NOTE — Progress Notes (Signed)
[facility] [phone]   Fax:([phone]  OFFICE PROGRESS NOTE  David Solian, MD [place] [address]  DIAGNOSIS: Stage IB (T2a, N0, M0) non-small cell lung cancer, poorly differentiated invasive squamous cell carcinoma diagnosed in April 2018  PRIOR THERAPY: Status post left upper lobectomy with lymph node dissection under the care of Dr. Roxan Hockey on 12/12/2016 with tumor size of 3.2 cm.  CURRENT THERAPY: Observation.  INTERVAL HISTORY: [name] 80 y.o. male returns to the clinic today for annual follow-up visit.  The patient is feeling fine today with no concerning complaints except for mild cough.  He denied having any chest pain, shortness of breath except with exertion and no hemoptysis.  He was diagnosed with COVID-19 in January 2022.  The patient denied having any current nausea, vomiting, diarrhea or constipation.  He has no headache or visual changes.  He is here today for evaluation with repeat CT scan of the chest for restaging of his disease.  MEDICAL HISTORY: Past Medical History:  Diagnosis Date   Anxiety    pt. reports that he " could swing from the ......" because he is so anxious about the surgery    Arthritis    R hand tendonitis    COPD (chronic obstructive pulmonary disease) ([facility])    Depression    Dyspnea    History of kidney stones    Hyperlipidemia    lung ca dx'd 10/2016   Pneumonia    Sleep apnea    cannot tolerate cpap    ALLERGIES:  is allergic to nsaids, gadolinium derivatives, and penicillins.  MEDICATIONS:  Current Outpatient Medications  Medication Sig Dispense Refill   acetaminophen (TYLENOL) 325 MG tablet Take 2 tablets (650 mg total) by mouth every 6 (six) hours as needed for mild pain (or Fever >/= 101).     albuterol (PROVENTIL) (2.5 MG/3ML) 0.083% nebulizer solution Take 3 mLs (2.5 mg total) by nebulization every 2 (two) hours as needed for wheezing. (Patient not taking:  Reported on 12/16/2020) 75 mL 3   budesonide (PULMICORT) 1 MG/2ML nebulizer solution Take 1 mg by nebulization 2 (two) times daily. (Patient not taking: Reported on 12/16/2020)     fexofenadine (ALLEGRA) 180 MG tablet Take 180 mg by mouth daily as needed for allergies or rhinitis.     predniSONE (DELTASONE) 10 MG tablet Take 6 tabs day one, 5 tabs day two, 4 tabs day three, etc (Patient not taking: Reported on 12/16/2020) 21 tablet 0   sertraline (ZOLOFT) 100 MG tablet Take 100 mg by mouth daily.     simvastatin (ZOCOR) 20 MG tablet Take 20 mg by mouth every evening.     SYMBICORT 160-4.5 MCG/ACT inhaler SMARTSIG:2 Puff(s) By Mouth Twice Daily     traZODone (DESYREL) 50 MG tablet Take 50 mg by mouth at bedtime.   1   No current facility-administered medications for this visit.    SURGICAL HISTORY:  Past Surgical History:  Procedure Laterality Date   COLONOSCOPY W/ POLYPECTOMY     cyst back  2011   CYSTOSCOPY  2012   for bleeding in bladder x 3   INTERCOSTAL NERVE BLOCK Right 10/28/2019   Procedure: Intercostal Nerve Block;  Surgeon: [name], MD;  Location: [facility];  Service: Thoracic;  Laterality: Right;   MOUTH SURGERY     NODE DISSECTION  10/28/2019   Procedure: Node Dissection;  Surgeon: [name], MD;  Location: [facility];  Service: Thoracic;;   VIDEO ASSISTED THORACOSCOPY (VATS)/ LOBECTOMY Left 12/12/2016   Procedure: VIDEO ASSISTED THORACOSCOPY (VATS)/LEFT UPPER LOBECTOMY;  Surgeon: [name], MD;  Location: [facility];  Service: Thoracic;  Laterality: Left;   VIDEO BRONCHOSCOPY Right 10/28/2019   VIDEO BRONCHOSCOPY WITH ENDOBRONCHIAL NAVIGATION (N/A )    VIDEO BRONCHOSCOPY WITH ENDOBRONCHIAL NAVIGATION N/A 10/28/2019   Procedure: VIDEO BRONCHOSCOPY WITH ENDOBRONCHIAL NAVIGATION;  Surgeon: [name], MD;  Location: [facility];  Service: Thoracic;  Laterality: N/A;    REVIEW OF SYSTEMS:  A comprehensive review of systems was negative except for:  Respiratory: positive for dyspnea on exertion   PHYSICAL EXAMINATION: General appearance: alert, cooperative, and no distress Head: Normocephalic, without obvious abnormality, atraumatic Neck: no adenopathy, no JVD, supple, symmetrical, trachea midline, and thyroid not enlarged, symmetric, no tenderness/mass/nodules Lymph nodes: Cervical, supraclavicular, and axillary nodes normal. Resp: clear to auscultation bilaterally Back: symmetric, no curvature. ROM normal. No CVA tenderness. Cardio: regular rate and rhythm, S1, S2 normal, no murmur, click, rub or gallop GI: soft, non-tender; bowel sounds normal; no masses,  no organomegaly Extremities: extremities normal, atraumatic, no cyanosis or edema  ECOG PERFORMANCE STATUS: 1 - Symptomatic but completely ambulatory  Blood pressure (!) 150/71, pulse 68, temperature (!) 96.7 F (35.9 C), temperature source Tympanic, resp. rate 20, height 5\' 6"  (1.676 m), weight 183 lb 12.8 oz (83.4 kg), SpO2 96 %.  LABORATORY DATA: Lab Results  Component Value Date   WBC 9.9 01/26/2021   HGB 15.9 01/26/2021   HCT 45.9 01/26/2021   MCV 91.8 01/26/2021   PLT 281 01/26/2021      Chemistry      Component Value Date/Time   NA 140 01/26/2021 1125   NA 139 07/17/2017 1049   K 4.6 01/26/2021 1125   K 4.4 07/17/2017 1049   CL 103 01/26/2021 1125   CO2 28 01/26/2021 1125   CO2 29 07/17/2017 1049   BUN 13 01/26/2021 1125   BUN 14.8 07/17/2017 1049   CREATININE 0.74 01/26/2021 1125   CREATININE 0.8 07/17/2017 1049      Component Value Date/Time   CALCIUM 9.7 01/26/2021 1125   CALCIUM 9.3 07/17/2017 1049   ALKPHOS 78 01/26/2021 1125   ALKPHOS 81 07/17/2017 1049   AST 13 (L) 01/26/2021 1125   AST 16 07/17/2017 1049   ALT 15 01/26/2021 1125   ALT 22 07/17/2017 1049   BILITOT 0.5 01/26/2021 1125   BILITOT 0.55 07/17/2017 1049       RADIOGRAPHIC STUDIES: CT Chest W Contrast  Result Date: 01/26/2021 CLINICAL DATA:  Non-small cell lung cancer  diagnosed in 2018 with previous lobectomy. Restaging. EXAM: CT CHEST WITH CONTRAST TECHNIQUE: Multidetector CT imaging of the chest was performed during intravenous contrast administration. CONTRAST:  29mL OMNIPAQUE IOHEXOL 300 MG/ML  SOLN COMPARISON:  Chest CT 01/27/2020 and 09/25/2019.  PET-CT 06/12/2019. FINDINGS: Cardiovascular: Diffuse atherosclerosis of the aorta, great vessels and coronary arteries. No acute vascular findings are demonstrated. There is stable mild central enlargement of the pulmonary arteries. The heart size is normal. There is no pericardial effusion. Mediastinum/Nodes: There are no enlarged mediastinal, hilar or axillary lymph nodes.Small mediastinal lymph nodes are stable. 2.2 x 1.2 cm right thyroid nodule is unchanged, not hypermetabolic on previous PET-CT. In the setting of significant comorbidities or limited life expectancy, no follow-up recommended unless clinically warranted.(Ref: J Am Coll Radiol. 2015 Feb;12(2): 143-50). The trachea and esophagus appear unremarkable. Lungs/Pleura: Interval resolution of previously demonstrated right pleural effusion. No pneumothorax. Stable  postsurgical changes in both upper lobes. 5 x 7 mm nodule in the superior segment image 44/7 is stable. New approximately 9 mm ground-glass density in the right lower lobe (image 89/7). Additional scattered small nodules and areas of scarring are stable. Upper abdomen: The visualized upper abdomen appears stable without suspicious findings. Musculoskeletal/Chest wall: There is no chest wall mass or suspicious osseous finding. Mild thoracic spondylosis. IMPRESSION: 1. Interval resolution of previously demonstrated small right pleural effusion. 2. New small ground-glass nodule in the right lower lobe, likely inflammatory. Attention on follow-up recommended. Additional scattered pulmonary nodules are stable. 3. Coronary and Aortic Atherosclerosis (ICD10-I70.0). Emphysema (ICD10-J43.9). Electronically Signed   By:  Richardean Sale M.D.   On: 01/26/2021 15:40     ASSESSMENT AND PLAN: This is a very pleasant 80 years old white male recently diagnosed with a stage IB non-small cell lung cancer, squamous cell carcinoma presented with left upper lobe lung mass status post left upper lobectomy with lymph node dissection. The tumor size was 3.2 cm. There is no evidence for visceral pleural or lymphovascular invasion. The patient is currently on observation and he is doing fine with no concerning complaints. Repeat CT scan of the chest showed no concerning findings for disease recurrence or metastasis. I recommended for the patient to continue on observation with repeat CT scan of the chest in 1 year. The patient was advised to call immediately if he has any concerning symptoms in the interval.  The patient voices understanding of current disease status and treatment options and is in agreement with the current care plan. All questions were answered. The patient knows to call the clinic with any problems, questions or concerns. We can certainly see the patient much sooner if necessary.  Disclaimer: This note was dictated with voice recognition software. Similar sounding words can inadvertently be transcribed and may not be corrected upon review.

## 2021-02-01 ENCOUNTER — Other Ambulatory Visit: Payer: Self-pay | Admitting: *Deleted

## 2021-02-01 NOTE — Patient Outreach (Signed)
[facility]) Care Management  02/01/2021  [name] [birth year] [id number]  Unsuccessful outreach attempt made to patient. RN Health Coach left HIPAA compliant voicemail message along with her contact information.  Plan: RN Health Coach will call patient within the month of July.  [name] RN, BSN [place] [phone] [name]

## 2021-02-02 ENCOUNTER — Telehealth: Payer: Self-pay | Admitting: Internal Medicine

## 2021-02-02 NOTE — Telephone Encounter (Signed)
Scheduled per los. Mailed printout  °

## 2021-03-02 ENCOUNTER — Ambulatory Visit: Payer: Medicare Other | Admitting: Thoracic Surgery (Cardiothoracic Vascular Surgery)

## 2021-03-04 ENCOUNTER — Other Ambulatory Visit: Payer: Self-pay | Admitting: *Deleted

## 2021-03-04 DIAGNOSIS — C3490 Malignant neoplasm of unspecified part of unspecified bronchus or lung: Secondary | ICD-10-CM | POA: Diagnosis not present

## 2021-03-04 DIAGNOSIS — R0602 Shortness of breath: Secondary | ICD-10-CM | POA: Diagnosis not present

## 2021-03-04 DIAGNOSIS — J441 Chronic obstructive pulmonary disease with (acute) exacerbation: Secondary | ICD-10-CM | POA: Diagnosis not present

## 2021-03-04 DIAGNOSIS — J189 Pneumonia, unspecified organism: Secondary | ICD-10-CM | POA: Diagnosis not present

## 2021-03-04 NOTE — Patient Outreach (Signed)
[facility]) Care Management  03/04/2021  CLEMENT DENEAULT 1941/04/10 [id number]  Unsuccessful outreach attempt made to patient. Patient's spouse answered the phone and stated that he would not be able to speak today. Spouse did request that this nurse call back at a later date.   Plan: RN Health Coach will call patient within the month of August  [place], [place] [phone] [email or web address]

## 2021-03-09 ENCOUNTER — Encounter: Payer: Self-pay | Admitting: Thoracic Surgery (Cardiothoracic Vascular Surgery)

## 2021-03-09 ENCOUNTER — Ambulatory Visit (INDEPENDENT_AMBULATORY_CARE_PROVIDER_SITE_OTHER): Payer: Medicare Other | Admitting: Thoracic Surgery (Cardiothoracic Vascular Surgery)

## 2021-03-09 ENCOUNTER — Other Ambulatory Visit: Payer: Self-pay

## 2021-03-09 VITALS — BP 150/77 | HR 60 | Resp 20 | Ht 66.0 in | Wt 184.0 lb

## 2021-03-09 DIAGNOSIS — Z902 Acquired absence of lung [part of]: Secondary | ICD-10-CM | POA: Diagnosis not present

## 2021-03-09 DIAGNOSIS — C3492 Malignant neoplasm of unspecified part of left bronchus or lung: Secondary | ICD-10-CM

## 2021-03-09 NOTE — Progress Notes (Signed)
[address]       [place] [address]             [phone]     HPI: Mr. Ambrosini returns for scheduled 1 year follow-up visit regarding lung cancer.  Valdemar Mcclenahan is an 80 year old man with a history of tobacco abuse, COPD, hyperlipidemia, aortic and coronary atherosclerosis on CT scan, sleep apnea, pneumonia, and depression.  He had a lingular sparing left upper lobectomy for a stage Ib squamous cell carcinoma in 2018.  He had a wedge resection of a second stage Ia squamous cell carcinoma in the right upper lobe in March 2021.  He has been followed since then.  He saw Dr. Julien Nordmann in June.  CT showed no evidence of recurrent disease.  There was a new groundglass opacity in the right lower lobe that was felt to probably be infectious or inflammatory in nature.  After he saw Dr. Julien Nordmann he had trouble with his breathing for about a month.  He finally saw PA and was started on antibiotics and prednisone.  His breathing improved with that.  He finishes his last pills today.  Other than that he feels well.   Past Medical History:  Diagnosis Date   Anxiety    pt. reports that he " could swing from the ......" because he is so anxious about the surgery    Arthritis    R hand tendonitis    COPD (chronic obstructive pulmonary disease) ([place])    Depression    Dyspnea    History of kidney stones    Hyperlipidemia    lung ca dx'd 10/2016   Pneumonia    Sleep apnea    cannot tolerate cpap    Current Outpatient Medications  Medication Sig Dispense Refill   acetaminophen (TYLENOL) 325 MG tablet Take 2 tablets (650 mg total) by mouth every 6 (six) hours as needed for mild pain (or Fever >/= 101).     albuterol (PROVENTIL) (2.5 MG/3ML) 0.083% nebulizer solution Take 3 mLs (2.5 mg total) by nebulization every 2 (two) hours as needed for wheezing. (Patient not taking: Reported on 12/16/2020) 75 mL 3   budesonide (PULMICORT) 1 MG/2ML nebulizer solution Take 1 mg by nebulization 2  (two) times daily. (Patient not taking: Reported on 12/16/2020)     fexofenadine (ALLEGRA) 180 MG tablet Take 180 mg by mouth daily as needed for allergies or rhinitis.     predniSONE (DELTASONE) 10 MG tablet Take 6 tabs day one, 5 tabs day two, 4 tabs day three, etc (Patient not taking: Reported on 12/16/2020) 21 tablet 0   sertraline (ZOLOFT) 100 MG tablet Take 100 mg by mouth daily.     simvastatin (ZOCOR) 20 MG tablet Take 20 mg by mouth every evening.     SYMBICORT 160-4.5 MCG/ACT inhaler SMARTSIG:2 Puff(s) By Mouth Twice Daily     traZODone (DESYREL) 50 MG tablet Take 50 mg by mouth at bedtime.   1   No current facility-administered medications for this visit.    Physical Exam BP (!) 150/77   Pulse 60   Resp 20   Ht 5\' 6"  (1.676 m)   Wt 184 lb (83.5 kg)   SpO2 93% Comment: RA  BMI 29.67 kg/m  80 year old man in no acute distress Alert and oriented x3 with no focal deficits No cervical or supraclavicular adenopathy Lungs diminished at bases bilaterally, no rales or wheezing Cardiac regular rate and rhythm   Diagnostic Tests: CT  CHEST WITH CONTRAST   TECHNIQUE: Multidetector CT imaging of the chest was performed during intravenous contrast administration.   CONTRAST:  28mL OMNIPAQUE IOHEXOL 300 MG/ML  SOLN   COMPARISON:  Chest CT 01/27/2020 and 09/25/2019.  PET-CT 06/12/2019.   FINDINGS: Cardiovascular: Diffuse atherosclerosis of the aorta, great vessels and coronary arteries. No acute vascular findings are demonstrated. There is stable mild central enlargement of the pulmonary arteries. The heart size is normal. There is no pericardial effusion.   Mediastinum/Nodes: There are no enlarged mediastinal, hilar or axillary lymph nodes.Small mediastinal lymph nodes are stable. 2.2 x 1.2 cm right thyroid nodule is unchanged, not hypermetabolic on previous PET-CT. In the setting of significant comorbidities or limited life expectancy, no follow-up recommended unless  clinically warranted.(Ref: J Am Coll Radiol. 2015 Feb;12(2): 143-50). The trachea and esophagus appear unremarkable.   Lungs/Pleura: Interval resolution of previously demonstrated right pleural effusion. No pneumothorax. Stable postsurgical changes in both upper lobes. 5 x 7 mm nodule in the superior segment image 44/7 is stable. New approximately 9 mm ground-glass density in the right lower lobe (image 89/7). Additional scattered small nodules and areas of scarring are stable.   Upper abdomen: The visualized upper abdomen appears stable without suspicious findings.   Musculoskeletal/Chest wall: There is no chest wall mass or suspicious osseous finding. Mild thoracic spondylosis.   IMPRESSION: 1. Interval resolution of previously demonstrated small right pleural effusion. 2. New small ground-glass nodule in the right lower lobe, likely inflammatory. Attention on follow-up recommended. Additional scattered pulmonary nodules are stable. 3. Coronary and Aortic Atherosclerosis (ICD10-I70.0). Emphysema (ICD10-J43.9).     Electronically Signed   By: Richardean Sale M.D.   On: 01/26/2021 15:40 I personally reviewed the CT images.  There is a new groundglass nodule in the right lower lobe.  No other suspicious findings.  Coronary and aortic atherosclerosis.  Impression: Rasmus Preusser is an 80 year old man with a history of tobacco abuse, COPD, hyperlipidemia, aortic and coronary atherosclerosis on CT scan, sleep apnea, pneumonia, and depression.  He also has a history of metachronous squamous cell carcinomas involving the left upper lobe in 2018 and the right upper lobe in 2021.  These were stage Ib and Ia lesions respectively.  Lung cancer-no evidence of recurrent disease on recent CT.  He did have a vague groundglass opacity in the right lower lobe this likely infectious or inflammatory.  It is possible this could be a low-grade adenocarcinoma.  In any event it needs a follow-up CT at 1  year, for which she is already scheduled.  Shortness of breath-improved with antibiotics and prednisone consistent with a COPD flare.  Aortic and coronary atherosclerosis-noted on CT.  Asymptomatic.  He is on Zocor.   Plan: Return in 1 year after CT chest  [name], MD [facility] [phone]

## 2021-03-16 ENCOUNTER — Other Ambulatory Visit: Payer: Self-pay | Admitting: *Deleted

## 2021-03-16 NOTE — Patient Instructions (Addendum)
Goals Addressed             This Visit's Progress    [facility]) Patient will verbalize weighing daily within the next 90 days       Timeframe:  Long-Range Goal Priority:  Medium Start Date:  03/16/21                           Expected End Date: 04/13/22                      Follow Up Date 07/14/21    -Discussed weighing daily and recording values in calendar booklet    Why is this important?   You will be able to handle your symptoms better if you keep track of them.  Making some simple changes to your lifestyle will help.  Eating healthy is one thing you can do to take good care of yourself.    Notes: 03/16/21: Patient explains that in the past he developed fluid overload and was place temporarily on a diuretic. Patient states he wants to prevent this from happening again. Nurse explained that weighing and recording the values is the best way to track rapid fluid gain. Patient states he will weigh daily and record his values. Nurse will send patient a calendar booklet to record his values.      [facility]) Patient will verbalized understanding about how to track and manage his COPD within the next 90 days       Timeframe:  Long-Range Goal Priority:  High Start Date:  08/20/20                           Expected End Date:  09/13/21                    Follow Up Date 03/13/21    - develop a rescue plan - eliminate symptom triggers at home - follow rescue plan if symptoms flare-up - keep follow-up appointments  -Encouraged Patient to take controlled inhaler Symbicort twice daily -Encouraged patient to take his rescue inhaler as needed when he feels like his Symbicort inhaler is not working and he becomes increasingly short of breath -Discussed COPD rescue plans and will send COPD education with color zones and rescue action plans   Why is this important?   Tracking your symptoms and other information about your health helps your doctor plan your care.  Write down the symptoms, the time of day,  what you were doing and what medicine you are taking.  You will soon learn how to manage your symptoms.     Notes: Patient reports being in close contact with his PCP with any worsening COPD symptoms.   Updated 12/16/20: Patient reports feeling fatigued and has felt short of breath. Patient states that he has not been taking his Symbicort inhaler or his rescue nebulizer as prescribed. Nurse provided education and will send patient COPD education that includes color zones and rescue action plans. Patient states he is going to call his PCP to request a refill for his rescue nebulizer because it is outdated. He will call Dr. Janee Morn office to schedule an office visit as it has been a long time since he has seen his pulmonologist. Patient reports that he wants to feel better and get back to walking his dog around the block like he was doing previously.  Updated 03/16/21: Patient states he just  completed antibiotics and prednisone for a COPD exacerbation. Patient explains that he is feeling much better and he has an upcoming visit with Dr. Annamaria Boots on 04/08/21. Patient reports that he is taking his Symbicort daily and he is using his rescue nebulizer.      [facility]) Track and monitor my blood pressure 3 times weekly within the next 90 days       Timeframe:  Long-Range Goal Priority:  Medium Start Date:  03/16/21                           Expected End Date: 04/13/22                      Follow Up Date 07/14/21    - check blood pressure 3 times per week - write blood pressure results in a log or diary    Why is this important?   You won't feel high blood pressure, but it can still hurt your blood vessels.  High blood pressure can cause heart or kidney problems. It can also cause a stroke.  Making lifestyle changes like losing a little weight or eating less salt will help.  Checking your blood pressure at home and at different times of the day can help to control blood pressure.  If the doctor prescribes medicine  remember to take it the way the doctor ordered.  Call the office if you cannot afford the medicine or if there are questions about it.     Notes: 03/16/21: Patient states he will begin to monitor and record his B/P 3 times weekly. Nurse will send patient a calendar booklet and education about B/P parameter and how to take a blood pressure at home.

## 2021-03-16 NOTE — Patient Outreach (Signed)
[name] [name] Butler [name] Butler [name] [name] Butler [name]) [name] Management  [name] [name] Butler  03/16/2021   [name] [name] Butler [name] [name], [name] [id number]  Subjective: Successful telephone outreach call to patient. [name] identifiers obtained. Patient states he is dong well at this time. Nurse discussed with patient his health goals and his health a wellness needs which were documented in the Epic system. Patient did not have any further questions or concerns today and did confirm that he has this nurse's contact number to call her if needed.   Encounter Medications:  Outpatient Encounter Medications as of 03/16/2021  Medication Sig Note   acetaminophen (TYLENOL) 325 MG tablet Take 2 tablets (650 mg total) by mouth every 6 (six) hours as needed for mild pain (or Fever >/= 101).    albuterol (PROVENTIL) (2.5 MG/3ML) 0.083% nebulizer solution Take 3 mLs (2.5 mg total) by nebulization every 2 (two) hours as needed for wheezing.    fexofenadine (ALLEGRA) 180 MG tablet Take 180 mg by mouth daily as needed for allergies or rhinitis.    sertraline (ZOLOFT) 100 MG tablet Take 100 mg by mouth daily.    simvastatin (ZOCOR) 20 MG tablet Take 20 mg by mouth every evening.    SYMBICORT 160-4.5 MCG/ACT inhaler SMARTSIG:2 Puff(s) By Mouth Twice Daily    traZODone (DESYREL) 50 MG tablet Take 50 mg by mouth at bedtime.     budesonide (PULMICORT) 1 MG/2ML nebulizer solution Take 1 mg by nebulization 2 (two) times daily. (Patient not taking: Reported on 03/16/2021) 12/16/2020: States he is not taking, completed   predniSONE (DELTASONE) 10 MG tablet Take 6 tabs day one, 5 tabs day two, 4 tabs day three, etc (Patient not taking: Reported on 03/16/2021) 12/16/2020: Completed   No facility-administered encounter medications on file as of 03/16/2021.    Functional Status:  No flowsheet data found.  Fall/Depression Screening: Fall Risk  03/16/2021 12/16/2020 08/20/2020  Falls in the past year? 0 0 0  Number falls in past yr: 0 0 0  Injury with Fall? 0 0 0  Follow  up - - -   PHQ 2/9 Scores 08/21/2020 02/03/2020 01/15/2020 11/13/2019  PHQ - 2 Score 0 0 0 0    Assessment:   [name] Plan There are no [name] plans that you recently modified to display for this patient.    Goals Addressed             This Visit's Progress    [facility]) Patient will verbalize weighing daily within the next 90 days       Timeframe:  Long-Range Goal Priority:  Medium Start Date:  03/16/21                           Expected End Date: 04/13/22                      Follow Up Date 07/14/21    -Discussed weighing daily and recording values in calendar booklet    Why is this important?   You will be able to handle your symptoms better if you keep track of them.  Making some simple changes to your lifestyle will help.  Eating healthy is one thing you can do to take good [name] of yourself.    Notes: 03/16/21: Patient explains that in the past he developed fluid overload and was place temporarily on a diuretic. Patient states he wants to prevent this from happening again. Nurse explained that weighing and recording the values  is the best way to track rapid fluid gain. Patient states he will weigh daily and record his values. Nurse will send patient a calendar booklet to record his values.      [facility]) Patient will verbalized understanding about how to track and manage his COPD within the next 90 days       Timeframe:  Long-Range Goal Priority:  High Start Date:  08/20/20                           Expected End Date:  09/13/21                    Follow Up Date 03/13/21    - develop a rescue plan - eliminate symptom triggers at home - follow rescue plan if symptoms flare-up - keep follow-up appointments  -Encouraged Patient to take controlled inhaler Symbicort twice daily -Encouraged patient to take his rescue inhaler as needed when he feels like his Symbicort inhaler is not working and he becomes increasingly short of breath -Discussed COPD rescue plans and will send COPD education with  color zones and rescue action plans   Why is this important?   Tracking your symptoms and other information about your health helps your doctor plan your [name].  Write down the symptoms, the time of day, what you were doing and what medicine you are taking.  You will soon learn how to manage your symptoms.     Notes: Patient reports being in close contact with his PCP with any worsening COPD symptoms.   Updated 12/16/20: Patient reports feeling fatigued and has felt short of breath. Patient states that he has not been taking his Symbicort inhaler or his rescue nebulizer as prescribed. Nurse provided education and will send patient COPD education that includes color zones and rescue action plans. Patient states he is going to call his PCP to request a refill for his rescue nebulizer because it is outdated. He will call Dr. Janee Morn office to schedule an office visit as it has been a long time since he has seen his pulmonologist. Patient reports that he wants to feel better and get back to walking his dog around the block like he was doing previously.  Updated 03/16/21: Patient states he just completed antibiotics and prednisone for a COPD exacerbation. Patient explains that he is feeling much better and he has an upcoming visit with Dr. Annamaria Boots on 04/08/21. Patient reports that he is taking his Symbicort daily and he is using his rescue nebulizer.      [facility]) Track and monitor my blood pressure 3 times weekly within the next 90 days       Timeframe:  Long-Range Goal Priority:  Medium Start Date:  03/16/21                           Expected End Date: 04/13/22                      Follow Up Date 07/14/21    - check blood pressure 3 times per week - write blood pressure results in a log or diary    Why is this important?   You won't feel high blood pressure, but it can still hurt your blood vessels.  High blood pressure can cause heart or kidney problems. It can also cause a stroke.  Making lifestyle  changes like losing a little  weight or eating less salt will help.  Checking your blood pressure at home and at different times of the day can help to control blood pressure.  If the doctor prescribes medicine remember to take it the way the doctor ordered.  Call the office if you cannot afford the medicine or if there are questions about it.     Notes: 03/16/21: Patient states he will begin to monitor and record his B/P 3 times weekly. Nurse will send patient a calendar booklet and education about B/P parameter and how to take a blood pressure at home.       Plan: RN Health Coach will send PCP a quarterly updated, will send patient a calendar booklet and education about how to  monitor your blood pressure at home, and will call patient within the month of October. Follow-up: Patient agrees to [name] Plan and Follow-up.  [name] RN, BSN [place] [phone] [email or web address]

## 2021-03-18 DIAGNOSIS — J441 Chronic obstructive pulmonary disease with (acute) exacerbation: Secondary | ICD-10-CM | POA: Diagnosis not present

## 2021-03-18 DIAGNOSIS — C3490 Malignant neoplasm of unspecified part of unspecified bronchus or lung: Secondary | ICD-10-CM | POA: Diagnosis not present

## 2021-03-18 DIAGNOSIS — R0602 Shortness of breath: Secondary | ICD-10-CM | POA: Diagnosis not present

## 2021-03-18 DIAGNOSIS — J189 Pneumonia, unspecified organism: Secondary | ICD-10-CM | POA: Diagnosis not present

## 2021-04-01 DIAGNOSIS — I10 Essential (primary) hypertension: Secondary | ICD-10-CM | POA: Diagnosis not present

## 2021-04-01 DIAGNOSIS — E669 Obesity, unspecified: Secondary | ICD-10-CM | POA: Diagnosis not present

## 2021-04-01 DIAGNOSIS — J189 Pneumonia, unspecified organism: Secondary | ICD-10-CM | POA: Diagnosis not present

## 2021-04-01 DIAGNOSIS — R0602 Shortness of breath: Secondary | ICD-10-CM | POA: Diagnosis not present

## 2021-04-01 DIAGNOSIS — G4733 Obstructive sleep apnea (adult) (pediatric): Secondary | ICD-10-CM | POA: Diagnosis not present

## 2021-04-01 DIAGNOSIS — C3490 Malignant neoplasm of unspecified part of unspecified bronchus or lung: Secondary | ICD-10-CM | POA: Diagnosis not present

## 2021-04-01 DIAGNOSIS — J441 Chronic obstructive pulmonary disease with (acute) exacerbation: Secondary | ICD-10-CM | POA: Diagnosis not present

## 2021-04-01 DIAGNOSIS — F329 Major depressive disorder, single episode, unspecified: Secondary | ICD-10-CM | POA: Diagnosis not present

## 2021-04-08 ENCOUNTER — Encounter: Payer: Self-pay | Admitting: Pulmonary Disease

## 2021-04-08 ENCOUNTER — Ambulatory Visit (INDEPENDENT_AMBULATORY_CARE_PROVIDER_SITE_OTHER): Payer: Medicare Other | Admitting: Pulmonary Disease

## 2021-04-08 ENCOUNTER — Institutional Professional Consult (permissible substitution): Payer: Medicare Other | Admitting: Internal Medicine

## 2021-04-08 ENCOUNTER — Other Ambulatory Visit: Payer: Self-pay

## 2021-04-08 VITALS — BP 130/70 | HR 73 | Ht 66.0 in | Wt 185.2 lb

## 2021-04-08 DIAGNOSIS — C3491 Malignant neoplasm of unspecified part of right bronchus or lung: Secondary | ICD-10-CM

## 2021-04-08 DIAGNOSIS — J449 Chronic obstructive pulmonary disease, unspecified: Secondary | ICD-10-CM | POA: Insufficient documentation

## 2021-04-08 DIAGNOSIS — Z85118 Personal history of other malignant neoplasm of bronchus and lung: Secondary | ICD-10-CM

## 2021-04-08 DIAGNOSIS — C3492 Malignant neoplasm of unspecified part of left bronchus or lung: Secondary | ICD-10-CM | POA: Diagnosis not present

## 2021-04-08 MED ORDER — TRELEGY ELLIPTA 100-62.5-25 MCG/INH IN AEPB: 1.0000 | INHALATION_SPRAY | Freq: Every day | RESPIRATORY_TRACT | 0 refills | Status: DC

## 2021-04-08 MED ORDER — TRELEGY ELLIPTA 100-62.5-25 MCG/INH IN AEPB: 1.0000 | INHALATION_SPRAY | Freq: Every day | RESPIRATORY_TRACT | 5 refills | Status: DC

## 2021-04-08 NOTE — Patient Instructions (Addendum)
Thank you for visiting Dr. Valeta Harms at [facility]. Today we recommend the following:  Trelegy samples today  New prescription for trelegy  Albuterol inhaler as needed  Albuterol nebulizer as needed  Vitamin D supplement 4000 IU daily OTC   Return in about 6 months (around 10/09/2021) for with APP or Dr. Valeta Harms.    Please do your part to reduce the spread of COVID-19.

## 2021-04-08 NOTE — Progress Notes (Signed)
Ultrasound chest procedure Note  [name]  [id number]  1941-04-03  Date:04/08/21  Time:4:34 PM   Provider Performing:Nicoles Sedlacek [name]   Procedure: Ultrasound chest  Indication(s) Pleural Effusion  Consent Risks of the procedure as well as the alternatives and risks of each were explained to the patient and/or caregiver.  Consent for the procedure was obtained and is signed in the bedside chart  Anesthesia Not applicable   Time Out Verified patient identification, verified procedure, site/side was marked, verified correct patient position, special equipment/implants available, medications/allergies/relevant history reviewed, required imaging and test results available.   Sterile Technique Not applicable  Procedure Description Ultrasound was used for visualization of both the right and left lung.  There was a small amount of pleural fluid within the left lung base and pleural thickening noted on point-of-care ultrasound.  The right sided lung appeared normal with normal sliding lung under ultrasound and normal number of B-lines.   Complications/Tolerance None; patient tolerated the procedure well. Chest X-ray is ordered to confirm no post-procedural complication.   EBL None   Specimen(s) None       David Nash, DO [place] [facility] 04/08/2021 4:36 PM

## 2021-04-08 NOTE — Progress Notes (Signed)
Synopsis: Referred in August 2022 for SOB/pleural effusion, h/o cancer by [name], MD  Subjective:   PATIENT ID: [name] GENDER: male DOB: 01/03/1941, MRN: [id number]  Chief Complaint  Patient presents with   Consult    Pt is being referred due to pleural effusion.  Pt states that he does have complaints of SOB that happens when he exerts himself. Also has an occ cough.    PMH of lung cancer, 2018 lingular sparing resection for 1b NSCLC and 2021 RATS wedge for stage 1a both by Dr. Roxan Hockey. Follows with Dr. Roxan Hockey and Dr. Julien Nordmann. From a respiratory standpoint his COPD managed with symbicort. He has been on this for along time. He occasionally uses a nebulizer. He is able to complete his activities of daily living with no issues.  He is interested in potentially switching up his inhaler regimen after he felt like it may help.   Past Medical History:  Diagnosis Date   Anxiety    pt. reports that he " could swing from the ......" because he is so anxious about the surgery    Arthritis    R hand tendonitis    COPD (chronic obstructive pulmonary disease) ([place])    Depression    Dyspnea    History of kidney stones    Hyperlipidemia    lung ca dx'd 10/2016   Pneumonia    Sleep apnea    cannot tolerate cpap     Family History  Problem Relation Age of Onset   Colon cancer Brother    Emphysema Other    Heart disease Other    Cancer Other    Heart disease Father    Heart disease Maternal Uncle    Heart disease Paternal Uncle      Past Surgical History:  Procedure Laterality Date   COLONOSCOPY W/ POLYPECTOMY     cyst back  2011   CYSTOSCOPY  2012   for bleeding in bladder x 3   INTERCOSTAL NERVE BLOCK Right 10/28/2019   Procedure: Intercostal Nerve Block;  Surgeon: [name], MD;  Location: [place];  Service: Thoracic;  Laterality: Right;   MOUTH SURGERY     NODE DISSECTION  10/28/2019   Procedure: Node Dissection;  Surgeon: [name], MD;  Location: [place];  Service: Thoracic;;   VIDEO ASSISTED THORACOSCOPY (VATS)/ LOBECTOMY Left 12/12/2016   Procedure: VIDEO ASSISTED THORACOSCOPY (VATS)/LEFT UPPER LOBECTOMY;  Surgeon: [name], MD;  Location: [place];  Service: Thoracic;  Laterality: Left;   VIDEO BRONCHOSCOPY Right 10/28/2019   VIDEO BRONCHOSCOPY WITH ENDOBRONCHIAL NAVIGATION (N/A )    VIDEO BRONCHOSCOPY WITH ENDOBRONCHIAL NAVIGATION N/A 10/28/2019   Procedure: VIDEO BRONCHOSCOPY WITH ENDOBRONCHIAL NAVIGATION;  Surgeon: [name], MD;  Location: [facility];  Service: Thoracic;  Laterality: N/A;    Social History   Socioeconomic History   Marital status: Married    Spouse name: Phyllis   Number of children: 1   Years of education: 12   Highest education level: Not on file  Occupational History   Occupation: Chief Strategy Officer  Tobacco Use   Smoking status: Former    Packs/day: 1.50    Years: 65.00    Pack years: 97.50    Types: Cigarettes    Start date: 08/15/1950    Quit date: 11/14/2015    Years since quitting: 5.4   Smokeless tobacco: Never  Vaping Use   Vaping Use: Never used  Substance and Sexual Activity   Alcohol use:  No    Comment: occasional beer   Drug use: No   Sexual activity: Not on file  Other Topics Concern   Not on file  Social History Narrative   Not on file   Social Determinants of Health   Financial Resource Strain: Not on file  Food Insecurity: No Food Insecurity   Worried About Running Out of Food in the Last Year: Never true   Ran Out of Food in the Last Year: Never true  Transportation Needs: No Transportation Needs   Lack of Transportation (Medical): No   Lack of Transportation (Non-Medical): No  Physical Activity: Not on file  Stress: Not on file  Social Connections: Not on file  Intimate Partner Violence: Not on file     Allergies  Allergen Reactions   Nsaids Other (See Comments)    MD RESTRICTS USE OF ANY "BLOOD THINING" MEDS [ "DUE TO GROWTH IN BLADDER"  ]    Gadolinium Derivatives Nausea And Vomiting and Other (See Comments)    CHILLS SHAKING FEELING COLD   Penicillins Other (See Comments)    "Feels like my skin is crawling"  Has patient had a PCN reaction causing immediate rash, facial/tongue/throat swelling, SOB or lightheadedness with hypotension: No Has patient had a PCN reaction causing severe rash involving mucus membranes or skin necrosis: No Has patient had a PCN reaction that required hospitalization No Has patient had a PCN reaction occurring within the last 10 years: No If all of the above answers are "NO", then may proceed with Cephalosporin use.      Outpatient Medications Prior to Visit  Medication Sig Dispense Refill   acetaminophen (TYLENOL) 325 MG tablet Take 2 tablets (650 mg total) by mouth every 6 (six) hours as needed for mild pain (or Fever >/= 101).     albuterol (PROVENTIL) (2.5 MG/3ML) 0.083% nebulizer solution Take 3 mLs (2.5 mg total) by nebulization every 2 (two) hours as needed for wheezing. 75 mL 3   fexofenadine (ALLEGRA) 180 MG tablet Take 180 mg by mouth daily as needed for allergies or rhinitis.     ipratropium-albuterol (DUONEB) 0.5-2.5 (3) MG/3ML SOLN Take 3 mLs by nebulization every 6 (six) hours as needed.     sertraline (ZOLOFT) 100 MG tablet Take 100 mg by mouth daily.     simvastatin (ZOCOR) 20 MG tablet Take 20 mg by mouth every evening.     SYMBICORT 160-4.5 MCG/ACT inhaler SMARTSIG:2 Puff(s) By Mouth Twice Daily     traZODone (DESYREL) 50 MG tablet Take 50 mg by mouth at bedtime.   1   budesonide (PULMICORT) 1 MG/2ML nebulizer solution Take 1 mg by nebulization 2 (two) times daily. (Patient not taking: Reported on 03/16/2021)     predniSONE (DELTASONE) 10 MG tablet Take 6 tabs day one, 5 tabs day two, 4 tabs day three, etc (Patient not taking: Reported on 03/16/2021) 21 tablet 0   No facility-administered medications prior to visit.    Review of Systems  Constitutional:  Negative for chills,  fever, malaise/fatigue and weight loss.  HENT:  Negative for hearing loss, sore throat and tinnitus.   Eyes:  Negative for blurred vision and double vision.  Respiratory:  Positive for shortness of breath. Negative for cough, hemoptysis, sputum production, wheezing and stridor.   Cardiovascular:  Positive for chest pain. Negative for palpitations, orthopnea, leg swelling and PND.  Gastrointestinal:  Negative for abdominal pain, constipation, diarrhea, heartburn, nausea and vomiting.  Genitourinary:  Negative for dysuria, hematuria and urgency.  Musculoskeletal:  Negative for joint pain and myalgias.  Skin:  Negative for itching and rash.  Neurological:  Negative for dizziness, tingling, weakness and headaches.  Endo/Heme/Allergies:  Negative for environmental allergies. Does not bruise/bleed easily.  Psychiatric/Behavioral:  Negative for depression. The patient is not nervous/anxious and does not have insomnia.   All other systems reviewed and are negative.   Objective:  Physical Exam Vitals reviewed.  Constitutional:      General: He is not in acute distress.    Appearance: He is well-developed. He is obese.  HENT:     Head: Normocephalic and atraumatic.  Eyes:     General: No scleral icterus.    Conjunctiva/sclera: Conjunctivae normal.     Pupils: Pupils are equal, round, and reactive to light.  Neck:     Vascular: No JVD.     Trachea: No tracheal deviation.  Cardiovascular:     Rate and Rhythm: Normal rate and regular rhythm.     Heart sounds: Normal heart sounds. No murmur heard. Pulmonary:     Effort: Pulmonary effort is normal. No tachypnea, accessory muscle usage or respiratory distress.     Breath sounds: No stridor. No wheezing, rhonchi or rales.     Comments: Diminished in the left base Abdominal:     General: Bowel sounds are normal. There is no distension.     Palpations: Abdomen is soft.     Tenderness: There is no abdominal tenderness.  Musculoskeletal:         General: No tenderness.     Cervical back: Neck supple.  Lymphadenopathy:     Cervical: No cervical adenopathy.  Skin:    General: Skin is warm and dry.     Capillary Refill: Capillary refill takes less than 2 seconds.     Findings: No rash.  Neurological:     Mental Status: He is alert and oriented to person, place, and time.  Psychiatric:        Behavior: Behavior normal.     Vitals:   04/08/21 1135  BP: 130/70  Pulse: 73  SpO2: 97%  Weight: 185 lb 3.2 oz (84 kg)  Height: 5\' 6"  (1.676 m)   97% on  RA BMI Readings from Last 3 Encounters:  04/08/21 29.89 kg/m  03/09/21 29.70 kg/m  01/28/21 29.67 kg/m   Wt Readings from Last 3 Encounters:  04/08/21 185 lb 3.2 oz (84 kg)  03/09/21 184 lb (83.5 kg)  01/28/21 183 lb 12.8 oz (83.4 kg)     CBC    Component Value Date/Time   WBC 9.9 01/26/2021 1125   WBC 14.9 (H) 10/30/2019 0521   RBC 5.00 01/26/2021 1125   HGB 15.9 01/26/2021 1125   HGB 14.9 07/17/2017 1049   HCT 45.9 01/26/2021 1125   HCT 43.6 07/17/2017 1049   PLT 281 01/26/2021 1125   PLT 282 07/17/2017 1049   MCV 91.8 01/26/2021 1125   MCV 93.8 07/17/2017 1049   MCH 31.8 01/26/2021 1125   MCHC 34.6 01/26/2021 1125   RDW 13.6 01/26/2021 1125   RDW 14.3 07/17/2017 1049   LYMPHSABS 0.9 01/26/2021 1125   LYMPHSABS 0.9 07/17/2017 1049   MONOABS 1.1 (H) 01/26/2021 1125   MONOABS 1.4 (H) 07/17/2017 1049   EOSABS 0.3 01/26/2021 1125   EOSABS 0.3 07/17/2017 1049   BASOSABS 0.1 01/26/2021 1125   BASOSABS 0.0 07/17/2017 1049     Chest Imaging: Chest x-ray 03/18/2021: Patient brought disc today to the office completed his PCP office. Chest  x-ray shows bilateral interstitial markings, areas of emphysema, no real significant pleural fluid accumulation, some minor blunting of the costophrenic angle. The patient's images have been independently reviewed by me.    Pulmonary Functions Testing Results: PFT Results Latest Ref Rng & Units 10/24/2019 11/24/2016   FVC-Pre L 2.33 1.77  FVC-Predicted Pre % 68 50  FVC-Post L 2.47 1.94  FVC-Predicted Post % 73 55  Pre FEV1/FVC % % 49 50  Post FEV1/FCV % % 49 52  FEV1-Pre L 1.15 0.89  FEV1-Predicted Pre % 48 36  FEV1-Post L 1.21 1.01  DLCO uncorrected ml/min/mmHg 11.49 11.22  DLCO UNC% % 53 41  DLCO corrected ml/min/mmHg 11.28 -  DLCO COR %Predicted % 52 -  DLVA Predicted % 76 68  TLC L 5.42 5.54  TLC % Predicted % 86 88  RV % Predicted % 123 138    FeNO:   Pathology:   Echocardiogram:   Heart Catheterization:     Assessment & Plan:     ICD-10-CM   1. History of lung cancer  Z85.118     2. Stage I squamous cell carcinoma of left lung (HCC)  C34.92     3. Stage 2 moderate COPD by GOLD classification ([place])  J44.9     4. Non-small cell carcinoma of lung, stage 1, right (HCC)  C34.91       Discussion: This is a 80 year old gentleman, longstanding history of smoking, 2 previous malignancies both resected stage I non-small cell lung cancers by Dr. Roxan Hockey.  Prior pulmonary function test with stage II COPD.  Currently on ICS/LABA.  Plan: Ultrasound today in the office as he was referred for pleural effusion evaluation reveals very small minimal amount of fluid on the left side compared to the right. No indication for sampling due to size.  Would just continue to monitor. Please see separate images from chest ultrasound today.  As for his COPD we will give him Trelegy samples and a new prescription for Trelegy. Will see if this makes a difference in his symptomatology related to COPD and breathlessness. Continue albuterol as needed Recommended starting vitamin D supplementation.  Return to clinic in approximately 3 months or as needed for COPD and inhaler follow-up   Current Outpatient Medications:    acetaminophen (TYLENOL) 325 MG tablet, Take 2 tablets (650 mg total) by mouth every 6 (six) hours as needed for mild pain (or Fever >/= 101)., Disp: , Rfl:    albuterol  (PROVENTIL) (2.5 MG/3ML) 0.083% nebulizer solution, Take 3 mLs (2.5 mg total) by nebulization every 2 (two) hours as needed for wheezing., Disp: 75 mL, Rfl: 3   fexofenadine (ALLEGRA) 180 MG tablet, Take 180 mg by mouth daily as needed for allergies or rhinitis., Disp: , Rfl:    ipratropium-albuterol (DUONEB) 0.5-2.5 (3) MG/3ML SOLN, Take 3 mLs by nebulization every 6 (six) hours as needed., Disp: , Rfl:    sertraline (ZOLOFT) 100 MG tablet, Take 100 mg by mouth daily., Disp: , Rfl:    simvastatin (ZOCOR) 20 MG tablet, Take 20 mg by mouth every evening., Disp: , Rfl:    SYMBICORT 160-4.5 MCG/ACT inhaler, SMARTSIG:2 Puff(s) By Mouth Twice Daily, Disp: , Rfl:    traZODone (DESYREL) 50 MG tablet, Take 50 mg by mouth at bedtime. , Disp: , Rfl: 1   Garner Nash, DO [place] [facility] 04/08/2021 11:40 AM

## 2021-06-01 DIAGNOSIS — Z23 Encounter for immunization: Secondary | ICD-10-CM | POA: Diagnosis not present

## 2021-06-07 ENCOUNTER — Other Ambulatory Visit: Payer: Self-pay | Admitting: *Deleted

## 2021-06-07 NOTE — Patient Outreach (Signed)
[place] [facility]) Care Management  06/07/2021  [name] 07/01/1941 [id number]  Unsuccessful outreach attempt made to patient. RN Health Coach left HIPAA compliant voicemail message along with her contact information.  Plan: RN Health Coach will call patient within the month of November.  [name] RN, BSN [place] [phone] [name]

## 2021-06-23 ENCOUNTER — Other Ambulatory Visit: Payer: Self-pay | Admitting: *Deleted

## 2021-06-23 NOTE — Patient Outreach (Signed)
[place] [facility]) Care Management  06/23/2021  DANNIE HATTABAUGH 04-23-1941 [id number]  Unsuccessful outreach attempt made to patient. RN Health Coach left HIPAA compliant voicemail message along with her contact information.  Plan: RN Health Coach will call patient within the month of November.  [name] RN, BSN [place] [phone] [email or web address]

## 2021-06-25 ENCOUNTER — Ambulatory Visit: Payer: Self-pay | Admitting: *Deleted

## 2021-07-01 DIAGNOSIS — H2513 Age-related nuclear cataract, bilateral: Secondary | ICD-10-CM | POA: Diagnosis not present

## 2021-07-01 DIAGNOSIS — H02831 Dermatochalasis of right upper eyelid: Secondary | ICD-10-CM | POA: Diagnosis not present

## 2021-07-01 DIAGNOSIS — H31001 Unspecified chorioretinal scars, right eye: Secondary | ICD-10-CM | POA: Diagnosis not present

## 2021-07-01 DIAGNOSIS — H5203 Hypermetropia, bilateral: Secondary | ICD-10-CM | POA: Diagnosis not present

## 2021-07-05 ENCOUNTER — Ambulatory Visit (INDEPENDENT_AMBULATORY_CARE_PROVIDER_SITE_OTHER): Payer: Medicare Other | Admitting: Pulmonary Disease

## 2021-07-05 ENCOUNTER — Other Ambulatory Visit: Payer: Self-pay

## 2021-07-05 ENCOUNTER — Encounter: Payer: Self-pay | Admitting: Pulmonary Disease

## 2021-07-05 VITALS — BP 122/68 | HR 84 | Temp 97.7°F | Ht 66.0 in | Wt 188.2 lb

## 2021-07-05 DIAGNOSIS — C3491 Malignant neoplasm of unspecified part of right bronchus or lung: Secondary | ICD-10-CM

## 2021-07-05 DIAGNOSIS — J449 Chronic obstructive pulmonary disease, unspecified: Secondary | ICD-10-CM

## 2021-07-05 DIAGNOSIS — R911 Solitary pulmonary nodule: Secondary | ICD-10-CM | POA: Diagnosis not present

## 2021-07-05 DIAGNOSIS — Z85118 Personal history of other malignant neoplasm of bronchus and lung: Secondary | ICD-10-CM | POA: Diagnosis not present

## 2021-07-05 DIAGNOSIS — Z902 Acquired absence of lung [part of]: Secondary | ICD-10-CM

## 2021-07-05 DIAGNOSIS — C3492 Malignant neoplasm of unspecified part of left bronchus or lung: Secondary | ICD-10-CM

## 2021-07-05 MED ORDER — ALBUTEROL SULFATE HFA 108 (90 BASE) MCG/ACT IN AERS: 2.0000 | INHALATION_SPRAY | Freq: Four times a day (QID) | RESPIRATORY_TRACT | 6 refills | Status: AC | PRN

## 2021-07-05 NOTE — Patient Instructions (Addendum)
Thank you for visiting Dr. Valeta Harms at [facility]. Today we recommend the following:  Stay on trelegy  Continue albuterol as needed  June Ct with Dr. Julien Nordmann  Return in about 1 year (around 07/05/2022) for with APP or Dr. Valeta Harms.    Please do your part to reduce the spread of COVID-19.

## 2021-07-05 NOTE — Progress Notes (Signed)
Synopsis: Referred in August 2022 for SOB/pleural effusion, h/o cancer by [name], MD  Subjective:   PATIENT ID: [name] GENDER: male DOB: 05/15/1941, MRN: [id number]  Chief Complaint  Patient presents with   Follow-up    PMH of lung cancer, 2018 lingular sparing resection for 1b NSCLC and 2021 RATS wedge for stage 1a both by Dr. Roxan Hockey. Follows with Dr. Roxan Hockey and Dr. Julien Nordmann. From a respiratory standpoint his COPD managed with symbicort. He has been on this for along time. He occasionally uses a nebulizer. He is able to complete his activities of daily living with no issues.  He is interested in potentially switching up his inhaler regimen after he felt like it may help.  OV 07/05/2021: Patient here today for follow-up for COPD.  Doing really well on Trelegy.  Feels like he is able to complete all of his activities of daily living.  Walks daily with his dog.  Rarely uses albuterol.  Had a CT scan in June which showed resolution of small pleural effusion.  Does have a right lower lobe groundglass opacity.  Has a repeat noncontrasted CT scan scheduled in June 2023 with Dr. Earlie Server.   Past Medical History:  Diagnosis Date   Anxiety    pt. reports that he " could swing from the ......" because he is so anxious about the surgery    Arthritis    R hand tendonitis    COPD (chronic obstructive pulmonary disease) ([place])    Depression    Dyspnea    History of kidney stones    Hyperlipidemia    lung ca dx'd 10/2016   Pneumonia    Sleep apnea    cannot tolerate cpap     Family History  Problem Relation Age of Onset   Colon cancer Brother    Emphysema Other    Heart disease Other    Cancer Other    Heart disease Father    Heart disease Maternal Uncle    Heart disease Paternal Uncle      Past Surgical History:  Procedure Laterality Date   COLONOSCOPY W/ POLYPECTOMY     cyst back  2011   CYSTOSCOPY  2012   for bleeding in bladder x 3   INTERCOSTAL NERVE  BLOCK Right 10/28/2019   Procedure: Intercostal Nerve Block;  Surgeon: [name], MD;  Location: [facility];  Service: Thoracic;  Laterality: Right;   MOUTH SURGERY     NODE DISSECTION  10/28/2019   Procedure: Node Dissection;  Surgeon: [name], MD;  Location: [facility];  Service: Thoracic;;   VIDEO ASSISTED THORACOSCOPY (VATS)/ LOBECTOMY Left 12/12/2016   Procedure: VIDEO ASSISTED THORACOSCOPY (VATS)/LEFT UPPER LOBECTOMY;  Surgeon: [name], MD;  Location: [facility];  Service: Thoracic;  Laterality: Left;   VIDEO BRONCHOSCOPY Right 10/28/2019   VIDEO BRONCHOSCOPY WITH ENDOBRONCHIAL NAVIGATION (N/A )    VIDEO BRONCHOSCOPY WITH ENDOBRONCHIAL NAVIGATION N/A 10/28/2019   Procedure: VIDEO BRONCHOSCOPY WITH ENDOBRONCHIAL NAVIGATION;  Surgeon: [name], MD;  Location: [facility];  Service: Thoracic;  Laterality: N/A;    Social History   Socioeconomic History   Marital status: Married    Spouse name: Phyllis   Number of children: 1   Years of education: 12   Highest education level: Not on file  Occupational History   Occupation: Chief Strategy Officer  Tobacco Use   Smoking status: Former    Packs/day: 1.50    Years: 65.00    Pack years: 97.50  Types: Cigarettes    Start date: 08/15/1950    Quit date: 11/14/2015    Years since quitting: 5.6   Smokeless tobacco: Never  Vaping Use   Vaping Use: Never used  Substance and Sexual Activity   Alcohol use: No    Comment: occasional beer   Drug use: No   Sexual activity: Not on file  Other Topics Concern   Not on file  Social History Narrative   Not on file   Social Determinants of Health   Financial Resource Strain: Not on file  Food Insecurity: No Food Insecurity   Worried About Running Out of Food in the Last Year: Never true   Ran Out of Food in the Last Year: Never true  Transportation Needs: No Transportation Needs   Lack of Transportation (Medical): No   Lack of Transportation (Non-Medical): No  Physical  Activity: Not on file  Stress: Not on file  Social Connections: Not on file  Intimate Partner Violence: Not on file     Allergies  Allergen Reactions   Nsaids Other (See Comments)    MD RESTRICTS USE OF ANY "BLOOD THINING" MEDS [ "DUE TO GROWTH IN BLADDER" ]    Gadolinium Derivatives Nausea And Vomiting and Other (See Comments)    CHILLS SHAKING FEELING COLD   Penicillins Other (See Comments)    "Feels like my skin is crawling"  Has patient had a PCN reaction causing immediate rash, facial/tongue/throat swelling, SOB or lightheadedness with hypotension: No Has patient had a PCN reaction causing severe rash involving mucus membranes or skin necrosis: No Has patient had a PCN reaction that required hospitalization No Has patient had a PCN reaction occurring within the last 10 years: No If all of the above answers are "NO", then may proceed with Cephalosporin use.      Outpatient Medications Prior to Visit  Medication Sig Dispense Refill   acetaminophen (TYLENOL) 325 MG tablet Take 2 tablets (650 mg total) by mouth every 6 (six) hours as needed for mild pain (or Fever >/= 101).     fexofenadine (ALLEGRA) 180 MG tablet Take 180 mg by mouth daily as needed for allergies or rhinitis.     Fluticasone-Umeclidin-Vilant (TRELEGY ELLIPTA) 100-62.5-25 MCG/INH AEPB Inhale 1 puff into the lungs daily. 14 each 0   Fluticasone-Umeclidin-Vilant (TRELEGY ELLIPTA) 100-62.5-25 MCG/INH AEPB Inhale 1 puff into the lungs daily. 60 each 5   sertraline (ZOLOFT) 100 MG tablet Take 100 mg by mouth daily.     simvastatin (ZOCOR) 20 MG tablet Take 20 mg by mouth every evening.     traZODone (DESYREL) 50 MG tablet Take 50 mg by mouth at bedtime.   1   albuterol (PROVENTIL) (2.5 MG/3ML) 0.083% nebulizer solution Take 3 mLs (2.5 mg total) by nebulization every 2 (two) hours as needed for wheezing. (Patient not taking: Reported on 07/05/2021) 75 mL 3   ipratropium-albuterol (DUONEB) 0.5-2.5 (3) MG/3ML SOLN Take 3  mLs by nebulization every 6 (six) hours as needed. (Patient not taking: Reported on 07/05/2021)     No facility-administered medications prior to visit.    Review of Systems  Constitutional:  Negative for chills, fever, malaise/fatigue and weight loss.  HENT:  Negative for hearing loss, sore throat and tinnitus.   Eyes:  Negative for blurred vision and double vision.  Respiratory:  Positive for shortness of breath. Negative for cough, hemoptysis, sputum production, wheezing and stridor.   Cardiovascular:  Negative for chest pain, palpitations, orthopnea, leg swelling and PND.  Gastrointestinal:  Negative for abdominal pain, constipation, diarrhea, heartburn, nausea and vomiting.  Genitourinary:  Negative for dysuria, hematuria and urgency.  Musculoskeletal:  Negative for joint pain and myalgias.  Skin:  Negative for itching and rash.  Neurological:  Negative for dizziness, tingling, weakness and headaches.  Endo/Heme/Allergies:  Negative for environmental allergies. Does not bruise/bleed easily.  Psychiatric/Behavioral:  Negative for depression. The patient is not nervous/anxious and does not have insomnia.   All other systems reviewed and are negative.   Objective:  Physical Exam Vitals reviewed.  Constitutional:      General: He is not in acute distress.    Appearance: He is well-developed. He is obese.  HENT:     Head: Normocephalic and atraumatic.  Eyes:     General: No scleral icterus.    Conjunctiva/sclera: Conjunctivae normal.     Pupils: Pupils are equal, round, and reactive to light.  Neck:     Vascular: No JVD.     Trachea: No tracheal deviation.  Cardiovascular:     Rate and Rhythm: Normal rate and regular rhythm.     Heart sounds: Normal heart sounds. No murmur heard. Pulmonary:     Effort: Pulmonary effort is normal. No tachypnea, accessory muscle usage or respiratory distress.     Breath sounds: No stridor. No wheezing, rhonchi or rales.     Comments:  Diminished breath sounds bilaterally no crackles no wheeze Abdominal:     General: Bowel sounds are normal. There is no distension.     Palpations: Abdomen is soft.     Tenderness: There is no abdominal tenderness.  Musculoskeletal:        General: No tenderness.     Cervical back: Neck supple.  Lymphadenopathy:     Cervical: No cervical adenopathy.  Skin:    General: Skin is warm and dry.     Capillary Refill: Capillary refill takes less than 2 seconds.     Findings: No rash.  Neurological:     Mental Status: He is alert and oriented to person, place, and time.  Psychiatric:        Behavior: Behavior normal.     Vitals:   07/05/21 1108  BP: 122/68  Pulse: 84  Temp: 97.7 F (36.5 C)  TempSrc: Oral  SpO2: 98%  Weight: 188 lb 3.2 oz (85.4 kg)  Height: 5\' 6"  (1.676 m)   98% on  RA BMI Readings from Last 3 Encounters:  07/05/21 30.38 kg/m  04/08/21 29.89 kg/m  03/09/21 29.70 kg/m   Wt Readings from Last 3 Encounters:  07/05/21 188 lb 3.2 oz (85.4 kg)  04/08/21 185 lb 3.2 oz (84 kg)  03/09/21 184 lb (83.5 kg)     CBC    Component Value Date/Time   WBC 9.9 01/26/2021 1125   WBC 14.9 (H) 10/30/2019 0521   RBC 5.00 01/26/2021 1125   HGB 15.9 01/26/2021 1125   HGB 14.9 07/17/2017 1049   HCT 45.9 01/26/2021 1125   HCT 43.6 07/17/2017 1049   PLT 281 01/26/2021 1125   PLT 282 07/17/2017 1049   MCV 91.8 01/26/2021 1125   MCV 93.8 07/17/2017 1049   MCH 31.8 01/26/2021 1125   MCHC 34.6 01/26/2021 1125   RDW 13.6 01/26/2021 1125   RDW 14.3 07/17/2017 1049   LYMPHSABS 0.9 01/26/2021 1125   LYMPHSABS 0.9 07/17/2017 1049   MONOABS 1.1 (H) 01/26/2021 1125   MONOABS 1.4 (H) 07/17/2017 1049   EOSABS 0.3 01/26/2021 1125   EOSABS 0.3 07/17/2017  1049   BASOSABS 0.1 01/26/2021 1125   BASOSABS 0.0 07/17/2017 1049     Chest Imaging: Chest x-ray 03/18/2021: Patient brought disc today to the office completed his PCP office. Chest x-ray shows bilateral interstitial  markings, areas of emphysema, no real significant pleural fluid accumulation, some minor blunting of the costophrenic angle. The patient's images have been independently reviewed by me.    01/26/2021 CT chest: Interval resolution of a right sided small pleural effusion, new small groundglass nodule in the right lower lobe. The patient's images have been independently reviewed by me.    Pulmonary Functions Testing Results: PFT Results Latest Ref Rng & Units 10/24/2019 11/24/2016  FVC-Pre L 2.33 1.77  FVC-Predicted Pre % 68 50  FVC-Post L 2.47 1.94  FVC-Predicted Post % 73 55  Pre FEV1/FVC % % 49 50  Post FEV1/FCV % % 49 52  FEV1-Pre L 1.15 0.89  FEV1-Predicted Pre % 48 36  FEV1-Post L 1.21 1.01  DLCO uncorrected ml/min/mmHg 11.49 11.22  DLCO UNC% % 53 41  DLCO corrected ml/min/mmHg 11.28 -  DLCO COR %Predicted % 52 -  DLVA Predicted % 76 68  TLC L 5.42 5.54  TLC % Predicted % 86 88  RV % Predicted % 123 138    FeNO:   Pathology:   Echocardiogram:   Heart Catheterization:     Assessment & Plan:     ICD-10-CM   1. Nodule of lower lobe of right lung  R91.1     2. History of lung cancer  Z85.118     3. Stage 2 moderate COPD by GOLD classification ([place])  J44.9     4. Stage I squamous cell carcinoma of left lung (HCC)  C34.92     5. Non-small cell carcinoma of lung, stage 1, right (HCC)  C34.91     6. S/P partial lobectomy of lung  Z90.2       Discussion: This is an 80 year old gentleman, longstanding history of smoking, 2 previous malignancies both resected for stage I non-small cell lung cancers by Dr. Roxan Hockey on the right and left side.  He has stage II COPD was on Symbicort.  He was upgraded to Trelegy and is doing better on triple therapy.  Plan: Continue triple therapy inhaler regimen. Continue albuterol as needed. Follow-up with [place] in 1 year. CT scan for his lung nodule follow-up as already been scheduled by Dr. Earlie Server for 6 months away. Follow-up in  June 2023 with Dr. Earlie Server. He can see [place] in 1 year or as needed.    Current Outpatient Medications:    acetaminophen (TYLENOL) 325 MG tablet, Take 2 tablets (650 mg total) by mouth every 6 (six) hours as needed for mild pain (or Fever >/= 101)., Disp: , Rfl:    albuterol (VENTOLIN HFA) 108 (90 Base) MCG/ACT inhaler, Inhale 2 puffs into the lungs every 6 (six) hours as needed for wheezing or shortness of breath., Disp: 8 g, Rfl: 6   fexofenadine (ALLEGRA) 180 MG tablet, Take 180 mg by mouth daily as needed for allergies or rhinitis., Disp: , Rfl:    Fluticasone-Umeclidin-Vilant (TRELEGY ELLIPTA) 100-62.5-25 MCG/INH AEPB, Inhale 1 puff into the lungs daily., Disp: 14 each, Rfl: 0   Fluticasone-Umeclidin-Vilant (TRELEGY ELLIPTA) 100-62.5-25 MCG/INH AEPB, Inhale 1 puff into the lungs daily., Disp: 60 each, Rfl: 5   sertraline (ZOLOFT) 100 MG tablet, Take 100 mg by mouth daily., Disp: , Rfl:    simvastatin (ZOCOR) 20 MG tablet, Take 20 mg by mouth  every evening., Disp: , Rfl:    traZODone (DESYREL) 50 MG tablet, Take 50 mg by mouth at bedtime. , Disp: , Rfl: 1   albuterol (PROVENTIL) (2.5 MG/3ML) 0.083% nebulizer solution, Take 3 mLs (2.5 mg total) by nebulization every 2 (two) hours as needed for wheezing. (Patient not taking: Reported on 07/05/2021), Disp: 75 mL, Rfl: 3   ipratropium-albuterol (DUONEB) 0.5-2.5 (3) MG/3ML SOLN, Take 3 mLs by nebulization every 6 (six) hours as needed. (Patient not taking: Reported on 07/05/2021), Disp: , Rfl:    Garner Nash, DO [facility] 07/05/2021 11:27 AM

## 2021-07-07 ENCOUNTER — Other Ambulatory Visit: Payer: Self-pay | Admitting: *Deleted

## 2021-07-07 NOTE — Patient Outreach (Signed)
[facility]) Care Management  07/07/2021  [name] [birth year] [id number]  Unsuccessful outreach attempt made to patient. Patient answered the phone and stated that he would not be able to speak today. He did request that this nurse call back at a later date.   Plan: RN Health Coach will call patient within the month of December.  [name] RN, BSN [place] [phone] [email or web address]

## 2021-08-03 ENCOUNTER — Other Ambulatory Visit: Payer: Self-pay | Admitting: *Deleted

## 2021-08-03 NOTE — Patient Outreach (Signed)
[facility]) Care Management  08/03/2021  [name] May 30, 1941 [id number]  Outreach attempt to patient. No answer and unable to leave voicemail message due to nurse called home number x 2; the phone picked up after one ring paused and then hung up.  Plan: RN Health Coach will call patient within the month of January.  [name] RN, BSN [place] [phone] [name]

## 2021-09-10 ENCOUNTER — Other Ambulatory Visit: Payer: Self-pay | Admitting: *Deleted

## 2021-09-10 NOTE — Patient Outreach (Signed)
[facility]) Care Management  09/10/2021  [name] 01/22/1941 [id number]  Unsuccessful outreach attempt made to patient.Spouse answered the phone and stated that patient was not at home. Nurse stated that she would call the patient back at a later date and wife replied she would let the patient know.  Plan: RN Health Coach will call patient within the month of February.  David Loron RN, BSN [place] [phone] [name]

## 2021-09-24 ENCOUNTER — Other Ambulatory Visit: Payer: Self-pay | Admitting: *Deleted

## 2021-09-24 NOTE — Patient Outreach (Signed)
[facility]) Care Management  09/24/2021  [name] 1940/09/05 [id number]  [facility]) Care Management RN Health Coach Note   09/24/2021 Name:  [name] MRN:  [id number] DOB:  October 08, 1940  Summary: Patient states he is feeling well. Reports Trelegy works very well to control his breathing but causes hoarseness. Nurse provided education and patient states he is rinsing his  mouth after the use of the inhaler. Patient states he will Dr. Valeta Butler to inform him of this side effect. Patient continues to stay active by walking  his dog, he is monitoring sodium in his diet, and states his home environment is safe. Patient did not have any further questions or concerns today and did confirm that he has this nurse's contact number to call her if needed.   Recommendations/Changes made from today's visit: Follow rescue plan if symptoms flare-up if in yellow zone for 48 hours without improvement make an appointment with provider (see information attached) Continue to walk your dog daily, do stretches, and stand and sit in front of your chair (a safe squat exercise) to strengthen legs Continue to eat a healthy low sodium diet and drink plenty of water Contact Dr. Valeta Butler to discuss Trelegy is causing hoarseness   Subjective: [name] is an 81 y.o. year old male who is a primary patient of [name], Ravisankar, MD. The care management team was consulted for assistance with care management and/or care coordination needs.    RN Health Coach completed Telephone Visit today.   Objective:  Medications Reviewed Today     Reviewed by David Cowboy, RN (Registered Nurse) on 09/24/21 at [facility] List Status: <None>   Medication Order Taking? Sig Documenting Provider Last Dose Status Informant  acetaminophen (TYLENOL) 325 MG tablet [id number] Yes Take 2 tablets (650 mg total) by mouth every 6 (six) hours as needed for mild pain (or Fever >/= 101). Elgergawy, Silver Huguenin, MD  Taking Active Self  albuterol (PROVENTIL) (2.5 MG/3ML) 0.083% nebulizer solution [id number] Yes Take 3 mLs (2.5 mg total) by nebulization every 2 (two) hours as needed for wheezing. Elgergawy, Silver Huguenin, MD Taking Active            Med Note Laretta Alstrom, Kadyn Chovan A   Tue Mar 16, 2021  1:46 PM)    albuterol (VENTOLIN HFA) 108 (90 Base) MCG/ACT inhaler [id number] Yes Inhale 2 puffs into the lungs every 6 (six) hours as needed for wheezing or shortness of breath. Garner Nash, DO Taking Active   fexofenadine (ALLEGRA) 180 MG tablet [id number] Yes Take 180 mg by mouth daily as needed for allergies or rhinitis. [provider] Taking Active Self  Fluticasone-Umeclidin-Vilant (TRELEGY ELLIPTA) 100-62.5-25 MCG/INH AEPB [id number] Yes Inhale 1 puff into the lungs daily. Garner Nash, DO Taking Active   Fluticasone-Umeclidin-Vilant (TRELEGY ELLIPTA) 100-62.5-25 MCG/INH AEPB [id number] Yes Inhale 1 puff into the lungs daily. Garner Nash, DO Taking Active   ipratropium-albuterol (DUONEB) 0.5-2.5 (3) MG/3ML SOLN [id number] Yes Take 3 mLs by nebulization every 6 (six) hours as needed. [provider] Taking Active   sertraline (ZOLOFT) 100 MG tablet [id number] Yes Take 100 mg by mouth daily. [provider] Taking Active Self  simvastatin (ZOCOR) 20 MG tablet [id number] Yes Take 20 mg by mouth every evening. [provider] Taking Active Self  traZODone (DESYREL) 50 MG tablet [id number] Yes Take 50 mg by mouth at bedtime.  [provider] Taking Active Self  SDOH:  (Social Determinants of Health) assessments and interventions performed:    Care Plan  Review of patient past medical history, allergies, medications, health status, including review of consultants reports, laboratory and other test data, was performed as part of comprehensive evaluation for care management services.   Care Plan : COPD (Adult)  Updates made by David Cowboy, RN since 09/24/2021  12:00 AM     Problem: Disease Progression (COPD) Resolved 09/24/2021  Priority: High     Long-Range Goal: Disease Progression Minimized or Managed Completed 09/24/2021  Start Date: 08/20/2020  Expected End Date: 09/13/2021  Note:   Resolving due to duplicate goal   Evidence-based guidance:  Identify current smoking/tobacco use; provide smoking cessation intervention.  Assess symptom control by the frequency and type of symptoms, reliever use and activity limitation at every encounter.  Assess risk for exacerbation (flare up) by evaluating spirometry, pulse oximetry, reliever use, presentation of symptoms and activity limitation; anticipate treatment adjustment based on risks and resources.  Develop and/or review and reinforce use of COPD rescue (action) plan even when symptoms are controlled or infrequent.  Ask patient to bring inhaler to all visits; assess and reinforce correct technique; address barriers to proper inhaler use, such as older age, use of multiple devices and lack of understanding.   Identify symptom triggers, such as smoking, virus, weather change, emotional upset, exercise, obesity and environmental allergen; consider reduction of work-exposure versus elimination to avoid compromising employment.  Correlate presentation to comorbidity, such as diabetes, heart failure, obstructive sleep apnea, depression and anxiety, which may worsen symptoms.  Prepare for individualized pharmacologic therapy that may include LABA (long-acting beta-2 agonist), LAMA (long-acting muscarinic antagonist), SABA (short-acting beta-2 agonist) oral or inhaled corticosteroid.  Promote participation in pulmonary rehabilitation for breathing exercises, skills training, improved exercise capacity, mood and quality of life; address barriers to participation.  Promote physical activity or exercise to improve or maintain exercise capacity, based on tolerance that may include walking, water exercise, cycling or  limb muscle strength training.  Promote use of energy conservation and activity pacing techniques.  Promote use of breathing and coughing techniques, such as inspiratory muscle training, pursed-lip breathing, diaphragmatic breathing, pranayama yoga breathing or huff cough.  Screen for malnutrition risk factors, such as unintentional weight loss and poor oral intake; refer to dietitian if identified.  Consider recommendation for oral drink supplement or multivitamin and mineral supplements if suspect inadequate oral intake or micronutrient deficiencies.   Screen for obstructive sleep apnea; prepare patient for polysomnography based on risk and presentation.  Prepare patient for use of long-term oxygen and noninvasive ventilation to relieve hypercapnia, hypoxemia, obstructive sleep apnea and reduce work of breathing.  Prepare patient with worsening disease for surgical interventions that may include bronchoscopy, lung volume reduction surgery, bullectomy or lung transplantation.   Notes:     Task: Alleviate Barriers to COPD Management Completed 09/24/2021  Due Date: 09/13/2021  Note:   Care Management Activities:    - activity or exercise based on tolerance encouraged - communicable disease prevention promoted - quality of sleep assessed - rescue (action) plan reviewed - screen for functional limitations completed and reviewed - self-awareness of symptom triggers encouraged    Notes:     Problem: Symptom Exacerbation (COPD) Resolved 09/24/2021  Priority: High     Long-Range Goal: Symptom Exacerbation Prevented or Minimized Completed 09/24/2021  Start Date: 08/20/2020  Expected End Date: 09/13/2021  Note:   Resolving due to duplicate goal  Evidence-based guidance:  Monitor for signs  of respiratory infection, including changes in sputum color, volume and thickness, as well as fever.  Encourage infection prevention strategies that may include prophylactic antibiotic therapy for patients  with history of frequent exacerbations or antibiotic administration during exacerbation based on presentation, risk and benefit.  Encourage receipt of influenza and pneumococcal vaccine.  Prepare patient for use of home long-term oxygen therapy in presence of sever resting hypoxemia.  Prepare patients for laboratory studies or diagnostic exams, such as spirometry, pulse oximetry and arterial blood gas based on current symptoms, risk factors and presentation.  Assess barriers and manage adherence, including inhaler technique and persistent trigger exposure; encourage adherence, even when symptoms are controlled or infrequent.  Assess and monitor for signs/symptoms of psychosocial concerns, such as shortness of breath-anxiety cycle or depression that may impact stability of symptoms.  Identify economic resources, sociocultural beliefs, social factors and health literacy that may interfere with adherence.  Promote lifestyle changes when needed, including regular physical activity based on tolerance, weight loss, healthy eating and stress management.  Consider referral to nurse or community health worker or home-visiting program for intensive support and education (disease-management program).  Increase frequency of follow-up following exacerbation or hospitalization; consider transition of care interventions, such as hospital visit, home visit, telephone follow-up, review of discharge summary and resource referrals.   Notes:     Task: Identify and Minimize Risk of COPD Exacerbation Completed 09/24/2021  Due Date: 09/13/2021  Note:   Care Management Activities:    - healthy lifestyle promoted - rescue (action) plan reviewed - signs/symptoms of infection reviewed - signs/symptoms of worsening disease assessed - symptom triggers identified - treatment plan reviewed    Notes:     Care Plan : RN Care Manager Plan of Care  Updates made by David Cowboy, RN since 09/24/2021 12:00 AM     Problem:  Knowledge Deficit Related to COPD   Priority: High     Long-Range Goal: Development of Plan of Care for the Management of COPD   Start Date: 09/24/2021  Expected End Date: 10/12/2022  Priority: High  Note:   Current Barriers:  Chronic Disease Management support and education needs related to COPD   RNCM Clinical Goal(s):  Patient will demonstrate Improved adherence to prescribed treatment plan for COPD as evidenced by no ED or hospitalizations due to COPD exacerbation continue to work with RN Care Manager to address care management and care coordination needs related to  COPD as evidenced by adherence to CM Team Scheduled appointments through collaboration with RN Care manager, provider, and care team.   Interventions: Inter-disciplinary care team collaboration (see longitudinal plan of care) Evaluation of current treatment plan related to  self management and patient's adherence to plan as established by provider   COPD Interventions:  (Status:  Goal on track:  Yes.) Long Term Goal Provided patient with basic written and verbal COPD education on self care/management/and exacerbation prevention Advised patient to track and manage COPD triggers Provided instruction about proper use of medications used for management of COPD including inhalers Advised patient to self assesses COPD action plan zone and make appointment with provider if in the yellow zone for 48 hours without improvement Advised patient to engage in light exercise as tolerated 3-5 days a week to aid in the the management of COPD Provided education about and advised patient to utilize infection prevention strategies to reduce risk of respiratory infection Discussed the importance of adequate rest and management of fatigue with COPD  Patient Goals/Self-Care Activities: Take  all medications as prescribed Attend all scheduled provider appointments Call provider office for new concerns or questions  identify and remove indoor  air pollutants limit outdoor activity during cold weather listen for public air quality announcements every day follow rescue plan if symptoms flare-up if in yellow zone for 48 hours without improvement make an appointment with provider (see information attached) Continue to walk your dog daily, do stretches, and stand and sit in front of your chair (a safe squat exercise) to strengthen legs Continue to eat a healthy low sodium diet and drink plenty of water Contact Dr. Valeta Butler to discuss Trelegy is causing hoarness  Follow Up Plan:  Telephone follow up appointment with care management team member scheduled for:  May       Plan: Telephone follow up appointment with care management team member scheduled for:  May. Nurse will send PCP today's assessment note.   [name] RN, [place] [phone] [email or web address]

## 2021-09-24 NOTE — Patient Instructions (Signed)
Visit Information  Thank you for taking time to visit with me today. Please don't hesitate to contact me if I can be of assistance to you before our next scheduled telephone appointment.  Following are the goals we discussed today:  Patient Goals/Self-Care Activities: Take all medications as prescribed Attend all scheduled provider appointments Call provider office for new concerns or questions  identify and remove indoor air pollutants limit outdoor activity during cold weather listen for public air quality announcements every day follow rescue plan if symptoms flare-up if in yellow zone for 48 hours without improvement make an appointment with provider (see information attached) Continue to walk your dog daily, do stretches, and stand and sit in front of your chair (a safe squat exercise) to strengthen legs Continue to eat a healthy low sodium diet and drink plenty of water Contact Dr. Valeta Harms to discuss Trelegy is causing hoarness   The patient verbalized understanding of instructions, educational materials, and care plan provided today and agreed to receive a mailed copy of patient instructions, educational materials, and care plan.   Telephone follow up appointment with care management team member scheduled for: May  [name] RN, [place] [phone] [email or web address]

## 2021-10-29 DIAGNOSIS — E0590 Thyrotoxicosis, unspecified without thyrotoxic crisis or storm: Secondary | ICD-10-CM | POA: Diagnosis not present

## 2021-10-29 DIAGNOSIS — I10 Essential (primary) hypertension: Secondary | ICD-10-CM | POA: Diagnosis not present

## 2021-10-29 DIAGNOSIS — Z125 Encounter for screening for malignant neoplasm of prostate: Secondary | ICD-10-CM | POA: Diagnosis not present

## 2021-10-29 DIAGNOSIS — E785 Hyperlipidemia, unspecified: Secondary | ICD-10-CM | POA: Diagnosis not present

## 2021-11-05 DIAGNOSIS — F329 Major depressive disorder, single episode, unspecified: Secondary | ICD-10-CM | POA: Diagnosis not present

## 2021-11-05 DIAGNOSIS — R82998 Other abnormal findings in urine: Secondary | ICD-10-CM | POA: Diagnosis not present

## 2021-11-05 DIAGNOSIS — I10 Essential (primary) hypertension: Secondary | ICD-10-CM | POA: Diagnosis not present

## 2021-11-05 DIAGNOSIS — G4733 Obstructive sleep apnea (adult) (pediatric): Secondary | ICD-10-CM | POA: Diagnosis not present

## 2021-11-05 DIAGNOSIS — J441 Chronic obstructive pulmonary disease with (acute) exacerbation: Secondary | ICD-10-CM | POA: Diagnosis not present

## 2021-11-05 DIAGNOSIS — R200 Anesthesia of skin: Secondary | ICD-10-CM | POA: Diagnosis not present

## 2021-11-05 DIAGNOSIS — R001 Bradycardia, unspecified: Secondary | ICD-10-CM | POA: Diagnosis not present

## 2021-11-05 DIAGNOSIS — E669 Obesity, unspecified: Secondary | ICD-10-CM | POA: Diagnosis not present

## 2021-11-05 DIAGNOSIS — Z0000 Encounter for general adult medical examination without abnormal findings: Secondary | ICD-10-CM | POA: Diagnosis not present

## 2021-11-05 DIAGNOSIS — C3490 Malignant neoplasm of unspecified part of unspecified bronchus or lung: Secondary | ICD-10-CM | POA: Diagnosis not present

## 2021-11-05 DIAGNOSIS — R0602 Shortness of breath: Secondary | ICD-10-CM | POA: Diagnosis not present

## 2021-11-05 DIAGNOSIS — Z23 Encounter for immunization: Secondary | ICD-10-CM | POA: Diagnosis not present

## 2021-11-05 DIAGNOSIS — R202 Paresthesia of skin: Secondary | ICD-10-CM | POA: Diagnosis not present

## 2021-12-08 ENCOUNTER — Encounter: Payer: Self-pay | Admitting: Cardiology

## 2021-12-08 ENCOUNTER — Ambulatory Visit: Payer: Medicare Other | Admitting: Cardiology

## 2021-12-08 VITALS — BP 174/91 | HR 74 | Temp 98.5°F | Resp 16 | Ht 66.0 in | Wt 191.0 lb

## 2021-12-08 DIAGNOSIS — R9431 Abnormal electrocardiogram [ECG] [EKG]: Secondary | ICD-10-CM

## 2021-12-08 DIAGNOSIS — E7800 Pure hypercholesterolemia, unspecified: Secondary | ICD-10-CM | POA: Diagnosis not present

## 2021-12-08 DIAGNOSIS — I700 Atherosclerosis of aorta: Secondary | ICD-10-CM

## 2021-12-08 DIAGNOSIS — R030 Elevated blood-pressure reading, without diagnosis of hypertension: Secondary | ICD-10-CM | POA: Diagnosis not present

## 2021-12-08 DIAGNOSIS — R0609 Other forms of dyspnea: Secondary | ICD-10-CM

## 2021-12-08 NOTE — Progress Notes (Signed)
? ?Primary Physician/Referring:  [name], MD ? ?Patient ID: WILHELM GANAWAY, male    DOB: 04-10-1941, 81 y.o.   MRN: [id number] ? ?Chief Complaint  ?Patient presents with  ? Bradycardia  ?  W/ exertion  ? ?HPI:   ? ?DENVIL CANNING  is a 81 y.o. Caucasian male patient with COPD, hyperlipidemia, upper lobe lobectomy bilateral, left in 2018 and right in 2021 by video-assisted bronchoscopy by Dr. Roxan Hockey for primary lung cancer, tobacco use disorder quit in 2017 is referred to me for evaluation of dyspnea on exertion. ? ?Patient states that physical activity is becoming limiting as he has to frequently stop to rest to catch his breath.  No associated chest pain.  No PND or orthopnea.  He is concerned about cardiac status and wanted to be evaluated further. ? ?Past Medical History:  ?Diagnosis Date  ? Anxiety   ? pt. reports that he " could swing from the ......" because he is so anxious about the surgery   ? Arthritis   ? R hand tendonitis   ? COPD (chronic obstructive pulmonary disease) ([place])   ? Depression   ? Dyspnea   ? History of kidney stones   ? Hyperlipidemia   ? lung ca dx'd 10/2016  ? Pneumonia   ? Sleep apnea   ? cannot tolerate cpap  ? ?Past Surgical History:  ?Procedure Laterality Date  ? COLONOSCOPY W/ POLYPECTOMY    ? cyst back  2011  ? CYSTOSCOPY  2012  ? for bleeding in bladder x 3  ? INTERCOSTAL NERVE BLOCK Right 10/28/2019  ? Procedure: Intercostal Nerve Block;  Surgeon: [name], MD;  Location: [place];  Service: Thoracic;  Laterality: Right;  ? MOUTH SURGERY    ? NODE DISSECTION  10/28/2019  ? Procedure: Node Dissection;  Surgeon: [name], MD;  Location: [place];  Service: Thoracic;;  ? VIDEO ASSISTED THORACOSCOPY (VATS)/ LOBECTOMY Left 12/12/2016  ? Procedure: VIDEO ASSISTED THORACOSCOPY (VATS)/LEFT UPPER LOBECTOMY;  Surgeon: [name], MD;  Location: [place];  Service: Thoracic;  Laterality: Left;  ? VIDEO BRONCHOSCOPY Right 10/28/2019  ? VIDEO BRONCHOSCOPY  WITH ENDOBRONCHIAL NAVIGATION (N/A )   ? VIDEO BRONCHOSCOPY WITH ENDOBRONCHIAL NAVIGATION N/A 10/28/2019  ? Procedure: VIDEO BRONCHOSCOPY WITH ENDOBRONCHIAL NAVIGATION;  Surgeon: [name], MD;  Location: [place];  Service: Thoracic;  Laterality: N/A;  ? ?Family History  ?Problem Relation Age of Onset  ? Colon cancer Brother   ? Emphysema Other   ? Heart disease Other   ? Cancer Other   ? Heart disease Father   ? Heart disease Maternal Uncle   ? Heart disease Paternal Uncle   ?  ?Social History  ? ?Tobacco Use  ? Smoking status: Former  ?  Packs/day: 1.50  ?  Years: 65.00  ?  Pack years: 97.50  ?  Types: Cigarettes  ?  Start date: 08/15/1950  ?  Quit date: 11/14/2015  ?  Years since quitting: 6.0  ? Smokeless tobacco: Never  ?Substance Use Topics  ? Alcohol use: No  ?  Comment: occasional beer  ? ?Marital Status: Married  ?ROS  ?Review of Systems  ?Cardiovascular:  Positive for dyspnea on exertion. Negative for chest pain and leg swelling.  ?Objective  ?Blood pressure (!) 174/91, pulse 74, temperature 98.5 ?F (36.9 ?C), temperature source Temporal, resp. rate 16, height 5\' 6"  (1.676 m), weight 191 lb (86.6 kg), SpO2 94 %. Body mass index is 30.83 kg/m?Marland Kitchen  ? ?  12/08/2021  ?  1:46 PM 12/08/2021  ?  1:42 PM 12/08/2021  ?  1:41 PM  ?Vitals with BMI  ?Height   5\' 6"   ?Weight   191 lbs  ?BMI   30.84  ?Systolic 546 270 350  ?Diastolic 91 093 83  ?Pulse 74 74 75  ?  ?Physical Exam ?Constitutional:   ?   Appearance: He is obese.  ?Neck:  ?   Vascular: No JVD.  ?Cardiovascular:  ?   Rate and Rhythm: Normal rate and regular rhythm.  ?   Pulses: Intact distal pulses.  ?   Heart sounds: Normal heart sounds. No murmur heard. ?  No gallop.  ?Pulmonary:  ?   Effort: Pulmonary effort is normal.  ?   Breath sounds: Normal breath sounds.  ?Abdominal:  ?   General: Bowel sounds are normal.  ?   Palpations: Abdomen is soft.  ?Musculoskeletal:  ?   Right lower leg: No edema.  ?   Left lower leg: No edema.  ? ? ?Medications and  allergies  ? ?Allergies  ?Allergen Reactions  ? Nsaids Other (See Comments)  ?  MD RESTRICTS USE OF ANY "BLOOD THINING" MEDS ?[ "DUE TO GROWTH IN BLADDER" ]   ? Gadolinium Derivatives Nausea And Vomiting and Other (See Comments)  ?  CHILLS ?SHAKING ?FEELING COLD  ? Penicillins Other (See Comments)  ?  "Feels like my skin is crawling" ? ?Has patient had a PCN reaction causing immediate rash, facial/tongue/throat swelling, SOB or lightheadedness with hypotension: No ?Has patient had a PCN reaction causing severe rash involving mucus membranes or skin necrosis: No ?Has patient had a PCN reaction that required hospitalization No ?Has patient had a PCN reaction occurring within the last 10 years: No ?If all of the above answers are "NO", then may proceed with Cephalosporin use. ?  ?  ? ?Medication list after today's encounter  ? ?Current Outpatient Medications:  ?  acetaminophen (TYLENOL) 325 MG tablet, Take 2 tablets (650 mg total) by mouth every 6 (six) hours as needed for mild pain (or Fever >/= 101)., Disp: , Rfl:  ?  albuterol (PROVENTIL) (2.5 MG/3ML) 0.083% nebulizer solution, Take 3 mLs (2.5 mg total) by nebulization every 2 (two) hours as needed for wheezing., Disp: 75 mL, Rfl: 3 ?  albuterol (VENTOLIN HFA) 108 (90 Base) MCG/ACT inhaler, Inhale 2 puffs into the lungs every 6 (six) hours as needed for wheezing or shortness of breath., Disp: 8 g, Rfl: 6 ?  fexofenadine (ALLEGRA) 180 MG tablet, Take 180 mg by mouth daily as needed for allergies or rhinitis., Disp: , Rfl:  ?  Fluticasone-Umeclidin-Vilant (TRELEGY ELLIPTA) 100-62.5-25 MCG/INH AEPB, Inhale 1 puff into the lungs daily., Disp: 14 each, Rfl: 0 ?  ipratropium-albuterol (DUONEB) 0.5-2.5 (3) MG/3ML SOLN, Take 3 mLs by nebulization every 6 (six) hours as needed., Disp: , Rfl:  ?  sertraline (ZOLOFT) 100 MG tablet, Take 100 mg by mouth daily., Disp: , Rfl:  ?  simvastatin (ZOCOR) 20 MG tablet, Take 20 mg by mouth every evening., Disp: , Rfl:  ?  traZODone  (DESYREL) 50 MG tablet, Take 50 mg by mouth at bedtime. , Disp: , Rfl: 1 ? ?Laboratory examination:  ? ?Recent Labs  ?  01/26/21 ?1125  ?NA 140  ?K 4.6  ?CL 103  ?CO2 28  ?GLUCOSE 102*  ?BUN 13  ?CREATININE 0.74  ?CALCIUM 9.7  ?GFRNONAA >60  ? ?CrCl cannot be calculated (Patient's most recent lab result is  older than the maximum 21 days allowed.).  ? ?  Latest Ref Rng & Units 01/26/2021  ? 11:25 AM 01/27/2020  ? 11:24 AM 10/30/2019  ?  5:21 AM  ?CMP  ?Glucose 70 - 99 mg/dL 102   105   108    ?BUN 8 - 23 mg/dL 13   15   17     ?Creatinine 0.61 - 1.24 mg/dL 0.74   0.75   0.72    ?Sodium 135 - 145 mmol/L 140   141   138    ?Potassium 3.5 - 5.1 mmol/L 4.6   4.4   4.5    ?Chloride 98 - 111 mmol/L 103   101   97    ?CO2 22 - 32 mmol/L 28   33   33    ?Calcium 8.9 - 10.3 mg/dL 9.7   9.8   8.8    ?Total Protein 6.5 - 8.1 g/dL 7.0   7.2   5.8    ?Total Bilirubin 0.3 - 1.2 mg/dL 0.5   0.5   0.9    ?Alkaline Phos 38 - 126 U/L 78   95   48    ?AST 15 - 41 U/L 13   9   13     ?ALT 0 - 44 U/L 15   8   16     ? ? ?  Latest Ref Rng & Units 01/26/2021  ? 11:25 AM 01/27/2020  ? 11:24 AM 10/30/2019  ?  5:21 AM  ?CBC  ?WBC 4.0 - 10.5 K/uL 9.9   10.8   14.9    ?Hemoglobin 13.0 - 17.0 g/dL 15.9   15.0   13.0    ?Hematocrit 39.0 - 52.0 % 45.9   45.4   40.6    ?Platelets 150 - 400 K/uL 281   334   210    ? ?External labs:  ? ?Cholesterol, total 117.000 m 09/24/2020 ?HDL 38.000 mg 09/24/2020 ?LDL 56.000 mg 09/24/2020 ?Triglycerides 113.000 m 09/24/2020 ? ?TSH 0.330 09/24/2020 ? ?Radiology:  ? ?CT chest 01/26/2021: ?1. Interval resolution of previously demonstrated small right pleural effusion. ?2. New small ground-glass nodule in the right lower lobe, likely ?inflammatory. Attention on follow-up recommended. Additional ?scattered pulmonary nodules are stable. ?3. Coronary and Aortic Atherosclerosis (ICD10-I70.0). Emphysema ?(ICD10-J43.9). ?Cardiovascular: Diffuse atherosclerosis of the aorta, great vessels ?and coronary arteries. No acute vascular  findings are demonstrated. ?There is stable mild central enlargement of the pulmonary arteries. ?The heart size is normal. There is no pericardial effusion. ? ?Cardiac Studies:  ? ?ABI 08/24/2018: ?Right  1.15       0.72

## 2021-12-13 ENCOUNTER — Ambulatory Visit: Payer: Medicare Other

## 2021-12-13 DIAGNOSIS — R9431 Abnormal electrocardiogram [ECG] [EKG]: Secondary | ICD-10-CM

## 2021-12-13 DIAGNOSIS — R0609 Other forms of dyspnea: Secondary | ICD-10-CM

## 2021-12-15 ENCOUNTER — Ambulatory Visit: Payer: Medicare Other

## 2021-12-15 DIAGNOSIS — R0609 Other forms of dyspnea: Secondary | ICD-10-CM | POA: Diagnosis not present

## 2021-12-15 DIAGNOSIS — R9431 Abnormal electrocardiogram [ECG] [EKG]: Secondary | ICD-10-CM | POA: Diagnosis not present

## 2021-12-19 NOTE — Progress Notes (Signed)
Normal stress

## 2021-12-20 NOTE — Progress Notes (Signed)
Called pt to inform him about his stress test. Pt understood

## 2021-12-20 NOTE — Progress Notes (Signed)
Called patient, NA, LMAM

## 2021-12-23 ENCOUNTER — Other Ambulatory Visit: Payer: Self-pay | Admitting: *Deleted

## 2021-12-23 NOTE — Patient Instructions (Signed)
Visit Information ? ?Thank you for taking time to visit with me today. Please don't hesitate to contact me if I can be of assistance to you before our next scheduled telephone appointment. ? ?Following are the goals we discussed today:  ? ?Take all medications as prescribed ?Attend all scheduled provider appointments ?Call provider office for new concerns or questions  ?identify and remove indoor air pollutants ?limit outdoor activity during cold weather ?listen for public air quality announcements every day ?follow rescue plan if symptoms flare-up if in yellow zone for 48 hours without improvement make an appointment with Dr. Valeta Harms or Dr. Dagmar Hait ?Continue to walk your dog daily, do stretches, and stand and sit in front of your chair (a safe squat exercise) to strengthen legs ?Continue to eat a healthy low sodium diet and drink plenty of water ? ?The patient verbalized understanding of instructions, educational materials, and care plan provided today and agreed to receive a mailed copy of patient instructions, educational materials, and care plan.  ? ?Telephone follow up appointment with care management team member scheduled for: August ? ?[name] RN, BSN ?Nurse Health Coach ?[place] ?[phone] ?[email or web address] ? ? ?  ?

## 2021-12-23 NOTE — Patient Outreach (Signed)
[facility]) Care Management ? ?12/23/2021 ? ?[name] ?11/10/1940 ?[id number] ? ?[facility]) Care Management ?RN Health Coach Note ? ? ?12/23/2021 ?Name:  TORIBIO SEIBER MRN:  [id number] DOB:  [birth year] ? ?Summary: ?Patient states he is doing fairly well. Reports getting established with cardiologist Dr. Einar Gip,  just had a stress test and echocardiogram, and has a follow-up appointment to  discuss results on 01/12/22. Patient explains he is not taking Trelegy daily. Nurse provided education regarding taking the control inhaler daily. He states that he continues to walk his dog and does light exercises routinely. He reports SOB when he over exerts physically but otherwise feels his COPD is at baseline. Patient did not have any further questions or concerns today and did confirm that he has this nurse's contact number to call her if needed.  ? ?Recommendations/Changes made from today's visit: ?follow rescue plan if symptoms flare-up if in yellow zone for 48 hours without improvement make an appointment with Dr. Valeta Harms or Dr. Dagmar Hait ?Continue to walk your dog daily, do stretches, and stand and sit in front of your chair (a safe squat exercise) to strengthen legs ?Continue to eat a healthy low sodium diet and drink plenty of water ? ?Subjective: ?GWEN EDLER is an 81 y.o. year old male who is a primary patient of Avva, Ravisankar, MD. The care management team was consulted for assistance with care management and/or care coordination needs.   ? ?RN Health Coach completed Telephone Visit today.  ? ?Objective: ? ?Medications Reviewed Today   ? ? Reviewed by Gaye Alken, Garrett (Certified Medical Assistant) on 12/08/21 at 1347  Med List Status: <None>  ? ?Medication Order Taking? Sig Documenting Provider Last Dose Status Informant  ?acetaminophen (TYLENOL) 325 MG tablet [id number] Yes Take 2 tablets (650 mg total) by mouth every 6 (six) hours as needed for mild pain (or Fever >/= 101).  Elgergawy, Silver Huguenin, MD Taking Active Self  ?albuterol (PROVENTIL) (2.5 MG/3ML) 0.083% nebulizer solution [id number] Yes Take 3 mLs (2.5 mg total) by nebulization every 2 (two) hours as needed for wheezing. Elgergawy, Silver Huguenin, MD Taking Active   ?         ?Med Note Laretta Alstrom, Pasha Gadison A   Tue Mar 16, 2021  1:46 PM)    ?albuterol (VENTOLIN HFA) 108 (90 Base) MCG/ACT inhaler [id number] Yes Inhale 2 puffs into the lungs every 6 (six) hours as needed for wheezing or shortness of breath. Garner Nash, DO Taking Active   ?fexofenadine (ALLEGRA) 180 MG tablet [id number] Yes Take 180 mg by mouth daily as needed for allergies or rhinitis. [provider] Taking Active Self  ?Fluticasone-Umeclidin-Vilant (TRELEGY ELLIPTA) 100-62.5-25 MCG/INH AEPB [id number] Yes Inhale 1 puff into the lungs daily. Garner Nash, DO Taking Active   ?ipratropium-albuterol (DUONEB) 0.5-2.5 (3) MG/3ML SOLN [id number] Yes Take 3 mLs by nebulization every 6 (six) hours as needed. [provider] Taking Active   ?sertraline (ZOLOFT) 100 MG tablet [id number] Yes Take 100 mg by mouth daily. [provider] Taking Active Self  ?simvastatin (ZOCOR) 20 MG tablet [id number] Yes Take 20 mg by mouth every evening. [provider] Taking Active Self  ?traZODone (DESYREL) 50 MG tablet [id number] Yes Take 50 mg by mouth at bedtime.  [provider] Taking Active Self  ? ?  ?  ? ?  ? ? ? ?SDOH:  (Social Determinants of Health) assessments and interventions performed:  ? ? ?Care Plan ? ?Review of  patient past medical history, allergies, medications, health status, including review of consultants reports, laboratory and other test data, was performed as part of comprehensive evaluation for care management services.  ? ?Care Plan : RN Care Manager Plan of Care  ?Updates made by Michiel Cowboy, RN since 12/23/2021 12:00 AM  ?  ? ?Problem: Knowledge Deficit Related to COPD   ?Priority: High  ?  ? ?Long-Range Goal: Development of Plan of  Care for the Management of COPD   ?Start Date: 09/24/2021  ?Expected End Date: 10/12/2022  ?Priority: High  ?Note:   ?Current Barriers:  ?Chronic Disease Management support and education needs related to COPD  ? ?RNCM Clinical Goal(s):  ?Patient will demonstrate Improved adherence to prescribed treatment plan for COPD as evidenced by no ED or hospitalizations due to COPD exacerbation ?continue to work with RN Care Manager to address care management and care coordination needs related to  COPD as evidenced by adherence to CM Team Scheduled appointments through collaboration with RN Care manager, provider, and care team.  ? ?Interventions: ?Inter-disciplinary care team collaboration (see longitudinal plan of care) ?Evaluation of current treatment plan related to  self management and patient's adherence to plan as established by provider ? ? ?COPD Interventions:  (Status:  Goal on track:  Yes.) Long Term Goal ?Provided patient with basic written and verbal COPD education on self care/management/and exacerbation prevention ?Advised patient to track and manage COPD triggers ?Provided instruction about proper use of medications used for management of COPD including inhalers ?Advised patient to self assesses COPD action plan zone and make appointment with provider if in the yellow zone for 48 hours without improvement ?Advised patient to engage in light exercise as tolerated 3-5 days a week to aid in the the management of COPD ?Provided education about and advised patient to utilize infection prevention strategies to reduce risk of respiratory infection ?Discussed the importance of adequate rest and management of fatigue with COPD ? ?Patient Goals/Self-Care Activities: ?Take all medications as prescribed ?Attend all scheduled provider appointments ?Call provider office for new concerns or questions  ?identify and remove indoor air pollutants ?limit outdoor activity during cold weather ?listen for public air quality  announcements every day ?follow rescue plan if symptoms flare-up if in yellow zone for 48 hours without improvement make an appointment with Dr. Valeta Harms or Dr. Dagmar Hait ?Continue to walk your dog daily, do stretches, and stand and sit in front of your chair (a safe squat exercise) to strengthen legs ?Continue to eat a healthy low sodium diet and drink plenty of water ? ? ?Follow Up Plan:  Telephone follow up appointment with care management team member scheduled for:  August ? ?  ?  ? ?Plan: Telephone follow up appointment with care management team member scheduled for:  August. Nurse will send PCP today's assessment note. ? ?[name] RN, BSN ?Nurse Health Coach ?[place] ?[phone] ?[email or web address] ? ? ? ? ? ? ? ?

## 2022-01-12 ENCOUNTER — Ambulatory Visit: Payer: Medicare Other | Admitting: Cardiology

## 2022-01-12 ENCOUNTER — Encounter: Payer: Self-pay | Admitting: Cardiology

## 2022-01-12 VITALS — BP 123/77 | HR 60 | Temp 97.2°F | Resp 16 | Ht 66.0 in | Wt 188.2 lb

## 2022-01-12 DIAGNOSIS — E7800 Pure hypercholesterolemia, unspecified: Secondary | ICD-10-CM

## 2022-01-12 DIAGNOSIS — R0609 Other forms of dyspnea: Secondary | ICD-10-CM | POA: Diagnosis not present

## 2022-01-12 DIAGNOSIS — I739 Peripheral vascular disease, unspecified: Secondary | ICD-10-CM | POA: Diagnosis not present

## 2022-01-12 MED ORDER — CILOSTAZOL 50 MG PO TABS: 50.0000 mg | ORAL_TABLET | Freq: Two times a day (BID) | ORAL | 2 refills | Status: DC

## 2022-01-12 NOTE — Progress Notes (Signed)
Primary Physician/Referring:  [name], MD  Patient ID: [name], male    DOB: 1941/06/20, 81 y.o.   MRN: [id number]  Chief Complaint  Patient presents with   Results   Follow-up   HPI:    [name]  is a 81 y.o. Caucasian male patient with COPD, hyperlipidemia, upper lobe lobectomy bilateral, left in 2018 and right in 2021 by video-assisted bronchoscopy by Dr. Roxan Hockey for primary lung cancer, tobacco use disorder quit in 2017 is referred to me for evaluation of dyspnea on exertion.  He was seen by me 6 weeks ago and underwent echocardiogram and nuclear stress test and presents for follow-up.  His main complaint today which he had not endorsed previously is severe pain and cramping in bilateral calves that makes him to stop frequently while he is walking.  No rest pain.  No change in dyspnea, no change in occasional wheezing, no leg edema.   Past Medical History:  Diagnosis Date   Anxiety    pt. reports that he " could swing from the ......" because he is so anxious about the surgery    Arthritis    R hand tendonitis    COPD (chronic obstructive pulmonary disease) ([place])    Depression    Dyspnea    History of kidney stones    Hyperlipidemia    lung ca dx'd 10/2016   Pneumonia    Sleep apnea    cannot tolerate cpap   Past Surgical History:  Procedure Laterality Date   COLONOSCOPY W/ POLYPECTOMY     cyst back  2011   CYSTOSCOPY  2012   for bleeding in bladder x 3   INTERCOSTAL NERVE BLOCK Right 10/28/2019   Procedure: Intercostal Nerve Block;  Surgeon: [name], MD;  Location: [place];  Service: Thoracic;  Laterality: Right;   MOUTH SURGERY     NODE DISSECTION  10/28/2019   Procedure: Node Dissection;  Surgeon: [name], MD;  Location: [place];  Service: Thoracic;;   VIDEO ASSISTED THORACOSCOPY (VATS)/ LOBECTOMY Left 12/12/2016   Procedure: VIDEO ASSISTED THORACOSCOPY (VATS)/LEFT UPPER LOBECTOMY;  Surgeon: [name], MD;   Location: [place];  Service: Thoracic;  Laterality: Left;   VIDEO BRONCHOSCOPY Right 10/28/2019   VIDEO BRONCHOSCOPY WITH ENDOBRONCHIAL NAVIGATION (N/A )    VIDEO BRONCHOSCOPY WITH ENDOBRONCHIAL NAVIGATION N/A 10/28/2019   Procedure: VIDEO BRONCHOSCOPY WITH ENDOBRONCHIAL NAVIGATION;  Surgeon: [name], MD;  Location: [place];  Service: Thoracic;  Laterality: N/A;   Family History  Problem Relation Age of Onset   Colon cancer Brother    Emphysema Other    Heart disease Other    Cancer Other    Heart disease Father    Heart disease Maternal Uncle    Heart disease Paternal Uncle     Social History   Tobacco Use   Smoking status: Former    Packs/day: 1.50    Years: 65.00    Pack years: 97.50    Types: Cigarettes    Start date: 08/15/1950    Quit date: 11/14/2015    Years since quitting: 6.1   Smokeless tobacco: Never  Substance Use Topics   Alcohol use: No    Comment: occasional beer   Marital Status: Married  ROS  Review of Systems  Cardiovascular:  Positive for claudication (calf) and dyspnea on exertion. Negative for chest pain and leg swelling.  Respiratory:  Positive for wheezing.   Objective  Blood pressure 123/77, pulse 60,  temperature (!) 97.2 F (36.2 C), temperature source Temporal, resp. rate 16, height 5\' 6"  (1.676 m), weight 188 lb 3.2 oz (85.4 kg), SpO2 95 %. Body mass index is 30.38 kg/m.     01/12/2022    2:23 PM 12/08/2021    1:46 PM 12/08/2021    1:42 PM  Vitals with BMI  Height 5\' 6"     Weight 188 lbs 3 oz    BMI 51.88    Systolic 416 606 301  Diastolic 77 91 601  Pulse 60 74 74    Physical Exam Constitutional:      Appearance: He is obese.  Neck:     Vascular: No carotid bruit or JVD.  Cardiovascular:     Rate and Rhythm: Normal rate and regular rhythm.     Pulses: Intact distal pulses.          Femoral pulses are 2+ on the right side and 2+ on the left side.      Popliteal pulses are 2+ on the right side and 2+ on the left side.        Dorsalis pedis pulses are 1+ on the right side and 1+ on the left side.       Posterior tibial pulses are 2+ on the right side and 2+ on the left side.     Heart sounds: Normal heart sounds. No murmur heard.   No gallop.  Pulmonary:     Effort: Pulmonary effort is normal.     Breath sounds: Normal breath sounds.  Abdominal:     General: Bowel sounds are normal.     Palpations: Abdomen is soft.  Musculoskeletal:     Right lower leg: No edema.     Left lower leg: No edema.    Medications and allergies   Allergies  Allergen Reactions   Nsaids Other (See Comments)    MD RESTRICTS USE OF ANY "BLOOD THINING" MEDS [ "DUE TO GROWTH IN BLADDER" ]    Gadolinium Derivatives Nausea And Vomiting and Other (See Comments)    CHILLS SHAKING FEELING COLD   Penicillins Other (See Comments)    "Feels like my skin is crawling"  Has patient had a PCN reaction causing immediate rash, facial/tongue/throat swelling, SOB or lightheadedness with hypotension: No Has patient had a PCN reaction causing severe rash involving mucus membranes or skin necrosis: No Has patient had a PCN reaction that required hospitalization No Has patient had a PCN reaction occurring within the last 10 years: No If all of the above answers are "NO", then may proceed with Cephalosporin use.      Medication list after today's encounter   Current Outpatient Medications:    acetaminophen (TYLENOL) 325 MG tablet, Take 2 tablets (650 mg total) by mouth every 6 (six) hours as needed for mild pain (or Fever >/= 101)., Disp: , Rfl:    albuterol (PROVENTIL) (2.5 MG/3ML) 0.083% nebulizer solution, Take 3 mLs (2.5 mg total) by nebulization every 2 (two) hours as needed for wheezing., Disp: 75 mL, Rfl: 3   albuterol (VENTOLIN HFA) 108 (90 Base) MCG/ACT inhaler, Inhale 2 puffs into the lungs every 6 (six) hours as needed for wheezing or shortness of breath., Disp: 8 g, Rfl: 6   fexofenadine (ALLEGRA) 180 MG tablet, Take 180 mg by mouth  daily as needed for allergies or rhinitis., Disp: , Rfl:    Fluticasone-Umeclidin-Vilant (TRELEGY ELLIPTA) 100-62.5-25 MCG/INH AEPB, Inhale 1 puff into the lungs daily., Disp: 14 each, Rfl: 0  ipratropium-albuterol (DUONEB) 0.5-2.5 (3) MG/3ML SOLN, Take 3 mLs by nebulization every 6 (six) hours as needed., Disp: , Rfl:    sertraline (ZOLOFT) 100 MG tablet, Take 100 mg by mouth daily., Disp: , Rfl:    simvastatin (ZOCOR) 20 MG tablet, Take 20 mg by mouth every evening., Disp: , Rfl:    traZODone (DESYREL) 50 MG tablet, Take 50 mg by mouth at bedtime. , Disp: , Rfl: 1   cilostazol (PLETAL) 50 MG tablet, Take 1 tablet (50 mg total) by mouth 2 (two) times daily., Disp: 60 tablet, Rfl: 2  Laboratory examination:   Recent Labs    01/26/21 1125  NA 140  K 4.6  CL 103  CO2 28  GLUCOSE 102*  BUN 13  CREATININE 0.74  CALCIUM 9.7  GFRNONAA >60   CrCl cannot be calculated (Patient's most recent lab result is older than the maximum 21 days allowed.).     Latest Ref Rng & Units 01/26/2021   11:25 AM 01/27/2020   11:24 AM 10/30/2019    5:21 AM  CMP  Glucose 70 - 99 mg/dL 102   105   108    BUN 8 - 23 mg/dL 13   15   17     Creatinine 0.61 - 1.24 mg/dL 0.74   0.75   0.72    Sodium 135 - 145 mmol/L 140   141   138    Potassium 3.5 - 5.1 mmol/L 4.6   4.4   4.5    Chloride 98 - 111 mmol/L 103   101   97    CO2 22 - 32 mmol/L 28   33   33    Calcium 8.9 - 10.3 mg/dL 9.7   9.8   8.8    Total Protein 6.5 - 8.1 g/dL 7.0   7.2   5.8    Total Bilirubin 0.3 - 1.2 mg/dL 0.5   0.5   0.9    Alkaline Phos 38 - 126 U/L 78   95   48    AST 15 - 41 U/L 13   9   13     ALT 0 - 44 U/L 15   8   16         Latest Ref Rng & Units 01/26/2021   11:25 AM 01/27/2020   11:24 AM 10/30/2019    5:21 AM  CBC  WBC 4.0 - 10.5 K/uL 9.9   10.8   14.9    Hemoglobin 13.0 - 17.0 g/dL 15.9   15.0   13.0    Hematocrit 39.0 - 52.0 % 45.9   45.4   40.6    Platelets 150 - 400 K/uL 281   334   210     External labs:    Cholesterol, total 117.000 m 09/24/2020 HDL 38.000 mg 09/24/2020 LDL 56.000 mg 09/24/2020 Triglycerides 113.000 m 09/24/2020  TSH 0.330 09/24/2020  Radiology:   CT chest 01/26/2021: 1. Interval resolution of previously demonstrated small right pleural effusion. 2. New small ground-glass nodule in the right lower lobe, likely inflammatory. Attention on follow-up recommended. Additional scattered pulmonary nodules are stable. 3. Coronary and Aortic Atherosclerosis (ICD10-I70.0). Emphysema (ICD10-J43.9). Cardiovascular: Diffuse atherosclerosis of the aorta, great vessels and coronary arteries. No acute vascular findings are demonstrated. There is stable mild central enlargement of the pulmonary arteries. The heart size is normal. There is no pericardial effusion.  Cardiac Studies:   ABI 08/24/2018: Right  1.15       0.72  1.21        0.82          +-------+-----------+-----------+------------+------------+  Left   1.16       0.75       1.09        0.77   PCV ECHOCARDIOGRAM COMPLETE 12/13/2021  Study Quality: Technically difficult study. Normal LV systolic function with visual EF 55-60%. Left ventricle cavity is normal in size. Mild left ventricular hypertrophy. Normal global wall motion. Normal diastolic filling pattern, normal LAP. No significant valvular heart disease. No prior study for comparison.    PCV MYOCARDIAL PERFUSION WITH LEXISCAN 12/15/2021  Nondiagnostic ECG stress. The heart rate response was consistent with Lexiscan. Myocardial perfusion is normal. Overall LV systolic function is normal without regional wall motion abnormalities. Stress LV EF: 51%. No previous exam available for comparison. Low risk.  EKG:   EKG 12/08/2021: Normal sinus rhythm at rate of 74 bpm, normal axis.  Right bundle branch block.  Nonspecific inferolateral ST-T abnormality.  Low-voltage complexes.  Compared to 10/24/2019, ST-T changes in the lateral leads is new.  Assessment      ICD-10-CM   1. Dyspnea on exertion  R06.09     2. Pure hypercholesterolemia  E78.00     3. Claudication of calf muscles (HCC)  I73.9 PCV LOWER ARTERIAL (BILATERAL)    cilostazol (PLETAL) 50 MG tablet       There are no discontinued medications.   Meds ordered this encounter  Medications   cilostazol (PLETAL) 50 MG tablet    Sig: Take 1 tablet (50 mg total) by mouth 2 (two) times daily.    Dispense:  60 tablet    Refill:  2   No orders of the defined types were placed in this encounter.  Recommendations:   [name] is a 81 y.o.  Caucasian male patient with COPD, hyperlipidemia, upper lobe lobectomy bilateral, left in 2018 and right in 2021 by video-assisted bronchoscopy by Dr. Roxan Hockey for primary lung cancer, tobacco use disorder quit in 2017 is referred to me for evaluation of dyspnea on exertion.  I reviewed the results of the echocardiogram and stress test, no evidence of pulmonary hypertension and no significant valve abnormalities, normal wall motion.  Nuclear stress test is low risk.  His main complaint today is bilateral calf claudication which he had not mentioned this last time.  States that he has to frequently rest when walking with the immediate relief.  His vascular examination is mildly abnormal.  He has had ABI of the lower extremity in 2020 which were within normal limits with triphasic waveforms at the level of the ankles.  I would like to repeat lower extremity arterial duplex to exclude significant SFA disease, I will also start him on Pletal 50 mg p.o. twice daily for claudication.  Previously patient has had history of urinary bladder AV fistula, this was in 2007 or so, he is aware of bleeding complications.  I would like to see him back in 2 months for follow-up.  If lower extremity arterial duplex is normal, I will see him back on a as needed basis.  He is on appropriate medical therapy with regard to lipids and his blood pressure is normal.      Adrian Prows, MD, [facility] 01/12/2022, 2:47 PM Office: [phone]

## 2022-01-20 ENCOUNTER — Other Ambulatory Visit: Payer: Self-pay | Admitting: Medical Oncology

## 2022-01-20 DIAGNOSIS — C3490 Malignant neoplasm of unspecified part of unspecified bronchus or lung: Secondary | ICD-10-CM

## 2022-01-21 ENCOUNTER — Other Ambulatory Visit: Payer: Self-pay

## 2022-01-21 ENCOUNTER — Other Ambulatory Visit: Payer: Medicare Other

## 2022-01-21 ENCOUNTER — Ambulatory Visit (HOSPITAL_BASED_OUTPATIENT_CLINIC_OR_DEPARTMENT_OTHER)
Admission: RE | Admit: 2022-01-21 | Discharge: 2022-01-21 | Disposition: A | Discharge status: Home / Self Care | Payer: Medicare Other | Source: Ambulatory Visit | Attending: Internal Medicine | Admitting: Internal Medicine

## 2022-01-21 ENCOUNTER — Inpatient Hospital Stay: Payer: Medicare Other | Attending: Oncology

## 2022-01-21 DIAGNOSIS — R918 Other nonspecific abnormal finding of lung field: Secondary | ICD-10-CM | POA: Diagnosis not present

## 2022-01-21 DIAGNOSIS — C3412 Malignant neoplasm of upper lobe, left bronchus or lung: Secondary | ICD-10-CM | POA: Insufficient documentation

## 2022-01-21 DIAGNOSIS — C3490 Malignant neoplasm of unspecified part of unspecified bronchus or lung: Secondary | ICD-10-CM | POA: Insufficient documentation

## 2022-01-21 DIAGNOSIS — Z902 Acquired absence of lung [part of]: Secondary | ICD-10-CM | POA: Insufficient documentation

## 2022-01-21 LAB — CBC WITH DIFFERENTIAL (CANCER CENTER ONLY)
Abs Immature Granulocytes: 0.1 10*3/uL — ABNORMAL HIGH (ref 0.00–0.07)
Basophils Absolute: 0.1 10*3/uL (ref 0.0–0.1)
Basophils Relative: 1 %
Eosinophils Absolute: 0.2 10*3/uL (ref 0.0–0.5)
Eosinophils Relative: 2 %
HCT: 46.1 % (ref 39.0–52.0)
Hemoglobin: 15.7 g/dL (ref 13.0–17.0)
Immature Granulocytes: 1 %
Lymphocytes Relative: 10 %
Lymphs Abs: 1.1 10*3/uL (ref 0.7–4.0)
MCH: 31 pg (ref 26.0–34.0)
MCHC: 34.1 g/dL (ref 30.0–36.0)
MCV: 91.1 fL (ref 80.0–100.0)
Monocytes Absolute: 1.3 10*3/uL — ABNORMAL HIGH (ref 0.1–1.0)
Monocytes Relative: 12 %
Neutro Abs: 8.2 10*3/uL — ABNORMAL HIGH (ref 1.7–7.7)
Neutrophils Relative %: 74 %
Platelet Count: 286 10*3/uL (ref 150–400)
RBC: 5.06 MIL/uL (ref 4.22–5.81)
RDW: 13.5 % (ref 11.5–15.5)
WBC Count: 11.1 10*3/uL — ABNORMAL HIGH (ref 4.0–10.5)
nRBC: 0 % (ref 0.0–0.2)

## 2022-01-21 LAB — CMP (CANCER CENTER ONLY)
ALT: 13 U/L (ref 0–44)
AST: 11 U/L — ABNORMAL LOW (ref 15–41)
Albumin: 4 g/dL (ref 3.5–5.0)
Alkaline Phosphatase: 64 U/L (ref 38–126)
Anion gap: 7 (ref 5–15)
BUN: 16 mg/dL (ref 8–23)
CO2: 28 mmol/L (ref 22–32)
Calcium: 9.9 mg/dL (ref 8.9–10.3)
Chloride: 104 mmol/L (ref 98–111)
Creatinine: 0.77 mg/dL (ref 0.61–1.24)
GFR, Estimated: >60 mL/min (ref >60)
Glucose, Bld: 104 mg/dL — ABNORMAL HIGH (ref 70–99)
Potassium: 4.6 mmol/L (ref 3.5–5.1)
Sodium: 139 mmol/L (ref 135–145)
Total Bilirubin: 0.6 mg/dL (ref 0.3–1.2)
Total Protein: 7.1 g/dL (ref 6.5–8.1)

## 2022-01-21 LAB — POCT I-STAT CREATININE: Creatinine, Ser: 0.8 mg/dL (ref 0.61–1.24)

## 2022-01-21 MED ORDER — IOHEXOL 300 MG/ML  SOLN
100.0000 mL | Freq: Once | INTRAMUSCULAR | Status: AC | PRN
  Administered 2022-01-21: 75 mL via INTRAVENOUS

## 2022-01-25 ENCOUNTER — Inpatient Hospital Stay (HOSPITAL_BASED_OUTPATIENT_CLINIC_OR_DEPARTMENT_OTHER): Payer: Medicare Other | Admitting: Internal Medicine

## 2022-01-25 ENCOUNTER — Other Ambulatory Visit: Payer: Self-pay

## 2022-01-25 ENCOUNTER — Encounter: Payer: Self-pay | Admitting: Internal Medicine

## 2022-01-25 VITALS — BP 127/67 | HR 87 | Temp 97.5°F | Resp 17 | Ht 66.0 in | Wt 188.1 lb

## 2022-01-25 DIAGNOSIS — C3412 Malignant neoplasm of upper lobe, left bronchus or lung: Secondary | ICD-10-CM | POA: Diagnosis not present

## 2022-01-25 DIAGNOSIS — C3492 Malignant neoplasm of unspecified part of left bronchus or lung: Secondary | ICD-10-CM | POA: Diagnosis not present

## 2022-01-25 DIAGNOSIS — C3490 Malignant neoplasm of unspecified part of unspecified bronchus or lung: Secondary | ICD-10-CM

## 2022-01-25 DIAGNOSIS — Z902 Acquired absence of lung [part of]: Secondary | ICD-10-CM | POA: Diagnosis not present

## 2022-01-25 NOTE — Progress Notes (Signed)
[facility] [phone]   Fax:(336) [phone]  OFFICE PROGRESS NOTE  [name], MD [place] [address]  DIAGNOSIS: Stage IB (T2a, N0, M0) non-small cell lung cancer, poorly differentiated invasive squamous cell carcinoma diagnosed in April 2018  PRIOR THERAPY: Status post left upper lobectomy with lymph node dissection under the care of Dr. Roxan Hockey on 12/12/2016 with tumor size of 3.2 cm.  CURRENT THERAPY: Observation.  INTERVAL HISTORY: [name] 81 y.o. male returns to the clinic today for follow-up visit.  The patient is feeling fine today with no concerning complaints.  He establish care with new cardiologist Dr. Einar Gip.  He is seeing him later today.  He denied having any current chest pain, shortness of breath, cough or hemoptysis.  He denied having any fever or chills.  He has no nausea, vomiting, diarrhea or constipation.  He has no headache or visual changes.  He had repeat CT scan of the chest performed recently and he is here for evaluation and discussion of his discuss results.  MEDICAL HISTORY: Past Medical History:  Diagnosis Date   Anxiety    pt. reports that he " could swing from the ......" because he is so anxious about the surgery    Arthritis    R hand tendonitis    COPD (chronic obstructive pulmonary disease) ([place])    Depression    Dyspnea    History of kidney stones    Hyperlipidemia    lung ca dx'd 10/2016   Pneumonia    Sleep apnea    cannot tolerate cpap    ALLERGIES:  is allergic to nsaids, gadolinium derivatives, and penicillins.  MEDICATIONS:  Current Outpatient Medications  Medication Sig Dispense Refill   acetaminophen (TYLENOL) 325 MG tablet Take 2 tablets (650 mg total) by mouth every 6 (six) hours as needed for mild pain (or Fever >/= 101).     albuterol (PROVENTIL) (2.5 MG/3ML) 0.083% nebulizer solution Take 3 mLs (2.5 mg total) by nebulization every 2 (two) hours as needed for  wheezing. 75 mL 3   albuterol (VENTOLIN HFA) 108 (90 Base) MCG/ACT inhaler Inhale 2 puffs into the lungs every 6 (six) hours as needed for wheezing or shortness of breath. 8 g 6   cilostazol (PLETAL) 50 MG tablet Take 1 tablet (50 mg total) by mouth 2 (two) times daily. 60 tablet 2   fexofenadine (ALLEGRA) 180 MG tablet Take 180 mg by mouth daily as needed for allergies or rhinitis.     Fluticasone-Umeclidin-Vilant (TRELEGY ELLIPTA) 100-62.5-25 MCG/INH AEPB Inhale 1 puff into the lungs daily. 14 each 0   ipratropium-albuterol (DUONEB) 0.5-2.5 (3) MG/3ML SOLN Take 3 mLs by nebulization every 6 (six) hours as needed.     sertraline (ZOLOFT) 100 MG tablet Take 100 mg by mouth daily.     simvastatin (ZOCOR) 20 MG tablet Take 20 mg by mouth every evening.     traZODone (DESYREL) 50 MG tablet Take 50 mg by mouth at bedtime.   1   No current facility-administered medications for this visit.    SURGICAL HISTORY:  Past Surgical History:  Procedure Laterality Date   COLONOSCOPY W/ POLYPECTOMY     cyst back  2011   CYSTOSCOPY  2012   for bleeding in bladder x 3   INTERCOSTAL NERVE BLOCK Right 10/28/2019   Procedure: Intercostal Nerve Block;  Surgeon: [name], MD;  Location: [facility];  Service: Thoracic;  Laterality: Right;  MOUTH SURGERY     NODE DISSECTION  10/28/2019   Procedure: Node Dissection;  Surgeon: [name], MD;  Location: [facility];  Service: Thoracic;;   VIDEO ASSISTED THORACOSCOPY (VATS)/ LOBECTOMY Left 12/12/2016   Procedure: VIDEO ASSISTED THORACOSCOPY (VATS)/LEFT UPPER LOBECTOMY;  Surgeon: [name], MD;  Location: [facility];  Service: Thoracic;  Laterality: Left;   VIDEO BRONCHOSCOPY Right 10/28/2019   VIDEO BRONCHOSCOPY WITH ENDOBRONCHIAL NAVIGATION (N/A )    VIDEO BRONCHOSCOPY WITH ENDOBRONCHIAL NAVIGATION N/A 10/28/2019   Procedure: VIDEO BRONCHOSCOPY WITH ENDOBRONCHIAL NAVIGATION;  Surgeon: [name], MD;  Location: [facility];  Service:  Thoracic;  Laterality: N/A;    REVIEW OF SYSTEMS:  A comprehensive review of systems was negative except for: Respiratory: positive for dyspnea on exertion   PHYSICAL EXAMINATION: General appearance: alert, cooperative, and no distress Head: Normocephalic, without obvious abnormality, atraumatic Neck: no adenopathy, no JVD, supple, symmetrical, trachea midline, and thyroid not enlarged, symmetric, no tenderness/mass/nodules Lymph nodes: Cervical, supraclavicular, and axillary nodes normal. Resp: clear to auscultation bilaterally Back: symmetric, no curvature. ROM normal. No CVA tenderness. Cardio: regular rate and rhythm, S1, S2 normal, no murmur, click, rub or gallop GI: soft, non-tender; bowel sounds normal; no masses,  no organomegaly Extremities: extremities normal, atraumatic, no cyanosis or edema  ECOG PERFORMANCE STATUS: 1 - Symptomatic but completely ambulatory  Blood pressure 127/67, pulse 87, temperature (!) 97.5 F (36.4 C), temperature source Oral, resp. rate 17, height 5\' 6"  (1.676 m), weight 188 lb 1.6 oz (85.3 kg), SpO2 95 %.  LABORATORY DATA: Lab Results  Component Value Date   WBC 11.1 (H) 01/21/2022   HGB 15.7 01/21/2022   HCT 46.1 01/21/2022   MCV 91.1 01/21/2022   PLT 286 01/21/2022      Chemistry      Component Value Date/Time   NA 139 01/21/2022 1315   NA 139 07/17/2017 1049   K 4.6 01/21/2022 1315   K 4.4 07/17/2017 1049   CL 104 01/21/2022 1315   CO2 28 01/21/2022 1315   CO2 29 07/17/2017 1049   BUN 16 01/21/2022 1315   BUN 14.8 07/17/2017 1049   CREATININE 0.80 01/21/2022 1403   CREATININE 0.77 01/21/2022 1315   CREATININE 0.8 07/17/2017 1049      Component Value Date/Time   CALCIUM 9.9 01/21/2022 1315   CALCIUM 9.3 07/17/2017 1049   ALKPHOS 64 01/21/2022 1315   ALKPHOS 81 07/17/2017 1049   AST 11 (L) 01/21/2022 1315   AST 16 07/17/2017 1049   ALT 13 01/21/2022 1315   ALT 22 07/17/2017 1049   BILITOT 0.6 01/21/2022 1315   BILITOT 0.55  07/17/2017 1049       RADIOGRAPHIC STUDIES: CT Chest W Contrast  Result Date: 01/24/2022 CLINICAL DATA:  Non-small cell lung cancer restaging. * Tracking Code: BO * EXAM: CT CHEST WITH CONTRAST TECHNIQUE: Multidetector CT imaging of the chest was performed during intravenous contrast administration. RADIATION DOSE REDUCTION: This exam was performed according to the departmental dose-optimization program which includes automated exposure control, adjustment of the mA and/or kV according to patient size and/or use of iterative reconstruction technique. CONTRAST:  61mL OMNIPAQUE IOHEXOL 300 MG/ML  SOLN COMPARISON:  01/26/2021 FINDINGS: Cardiovascular: Coronary, aortic arch, and branch vessel atherosclerotic vascular disease. Prominent main pulmonary artery likely representing pulmonary arterial hypertension. Mediastinum/Nodes: 2.4 cm right thyroid nodule, not previously hypermetabolic by PET-CT. In the setting of significant comorbidities or limited life expectancy, no follow-up recommended unless clinically indicated (ref: J Am Coll  Radiol. 2015 Feb;12(2): 143-50).No pathologic thoracic adenopathy. Lungs/Pleura: Mild biapical pleuroparenchymal scarring. Wedge resection findings in both upper lobes. 8 by 6 by 7 mm (volume = 175 mm^3) nodule in the superior segment left lower lobe on image 40 series 4, this previously measured 6 by 5 by 5 mm (volume = 75 mm^3) on 07/17/2017 and 7 by 5 by 7 mm (volume = 125 mm^3) on 01/26/2021. The ground-glass density nodule of concern in the right lower lobe has intervally cleared thus was likely inflammatory. Emphysema noted. Upper Abdomen: Subtle foci of enhancement in the liver are not substantially changed from prior exams such as 07/17/2017, probably represent vascular phenomenon or hemangiomas. Periampullary duodenal diverticulum. Atherosclerosis is present, including aortoiliac atherosclerotic disease. Musculoskeletal: Thoracic spondylosis. IMPRESSION: 1. Slowly  enlarging superior segment left lower lobe nodule, currently 8 by 6 by 7 mm. This has a little bit more than doubled in volume since 2018. Low-grade adenocarcinoma is a distinct possibility. 2. The ground-glass density nodule of concern in the right lower lobe previously has completely resolved. 3. Other imaging findings of potential clinical significance: Aortic Atherosclerosis (ICD10-I70.0). Coronary atherosclerosis. Suspected pulmonary arterial hypertension. Emphysema (ICD10-J43.9). Electronically Signed   By: Van Clines M.D.   On: 01/24/2022 10:11     ASSESSMENT AND PLAN: This is a very pleasant 81 years old white male recently diagnosed with a stage IB non-small cell lung cancer, squamous cell carcinoma presented with left upper lobe lung mass status post left upper lobectomy with lymph node dissection. The tumor size was 3.2 cm. There is no evidence for visceral pleural or lymphovascular invasion. The patient has been in observation for several years and doing fine. He had repeat CT scan of the chest performed recently.  I personally and independently reviewed the scan images and discussed the result and showed the images to the patient today. I recommended for him to have a PET scan for further evaluation of this nodule and to rule out any disease recurrence or new lung cancer. I will see him back for follow-up visit in 2-3 weeks for discussion of his PET scan results and further recommendation regarding his condition. The patient was advised to call immediately if he has any concerning symptoms in the interval.  The patient voices understanding of current disease status and treatment options and is in agreement with the current care plan. All questions were answered. The patient knows to call the clinic with any problems, questions or concerns. We can certainly see the patient much sooner if necessary.  Disclaimer: This note was dictated with voice recognition software. Similar  sounding words can inadvertently be transcribed and may not be corrected upon review.

## 2022-02-02 ENCOUNTER — Ambulatory Visit: Payer: Medicare Other

## 2022-02-02 DIAGNOSIS — I739 Peripheral vascular disease, unspecified: Secondary | ICD-10-CM

## 2022-02-04 ENCOUNTER — Encounter (HOSPITAL_COMMUNITY)
Admission: RE | Admit: 2022-02-04 | Discharge: 2022-02-04 | Disposition: A | Discharge status: Home / Self Care | Payer: Medicare Other | Source: Ambulatory Visit | Attending: Internal Medicine | Admitting: Internal Medicine

## 2022-02-04 DIAGNOSIS — J439 Emphysema, unspecified: Secondary | ICD-10-CM | POA: Diagnosis not present

## 2022-02-04 DIAGNOSIS — I700 Atherosclerosis of aorta: Secondary | ICD-10-CM | POA: Insufficient documentation

## 2022-02-04 DIAGNOSIS — R911 Solitary pulmonary nodule: Secondary | ICD-10-CM | POA: Diagnosis not present

## 2022-02-04 DIAGNOSIS — N400 Enlarged prostate without lower urinary tract symptoms: Secondary | ICD-10-CM | POA: Diagnosis not present

## 2022-02-04 DIAGNOSIS — I2510 Atherosclerotic heart disease of native coronary artery without angina pectoris: Secondary | ICD-10-CM | POA: Insufficient documentation

## 2022-02-04 DIAGNOSIS — C3490 Malignant neoplasm of unspecified part of unspecified bronchus or lung: Secondary | ICD-10-CM | POA: Insufficient documentation

## 2022-02-04 DIAGNOSIS — E041 Nontoxic single thyroid nodule: Secondary | ICD-10-CM | POA: Insufficient documentation

## 2022-02-04 LAB — GLUCOSE, CAPILLARY: Glucose-Capillary: 116 mg/dL — ABNORMAL HIGH (ref 70–99)

## 2022-02-04 MED ORDER — FLUDEOXYGLUCOSE F - 18 (FDG) INJECTION
9.2700 | Freq: Once | INTRAVENOUS | Status: AC | PRN
  Administered 2022-02-04: 9.27 via INTRAVENOUS

## 2022-02-14 ENCOUNTER — Other Ambulatory Visit: Payer: Self-pay

## 2022-02-14 ENCOUNTER — Inpatient Hospital Stay: Payer: Medicare Other | Attending: Internal Medicine | Admitting: Internal Medicine

## 2022-02-14 VITALS — BP 154/73 | HR 78 | Temp 97.8°F | Resp 18 | Ht 66.0 in | Wt 189.8 lb

## 2022-02-14 DIAGNOSIS — C3491 Malignant neoplasm of unspecified part of right bronchus or lung: Secondary | ICD-10-CM | POA: Diagnosis not present

## 2022-02-14 DIAGNOSIS — C3492 Malignant neoplasm of unspecified part of left bronchus or lung: Secondary | ICD-10-CM | POA: Diagnosis not present

## 2022-02-14 DIAGNOSIS — C3490 Malignant neoplasm of unspecified part of unspecified bronchus or lung: Secondary | ICD-10-CM | POA: Diagnosis not present

## 2022-02-14 DIAGNOSIS — Z902 Acquired absence of lung [part of]: Secondary | ICD-10-CM | POA: Diagnosis not present

## 2022-02-14 DIAGNOSIS — C3412 Malignant neoplasm of upper lobe, left bronchus or lung: Secondary | ICD-10-CM | POA: Insufficient documentation

## 2022-02-14 NOTE — Progress Notes (Signed)
[facility] Telephone:(336) [phone]   Fax:(336) [phone]  OFFICE PROGRESS NOTE  David Solian, MD [place] [address]  DIAGNOSIS: Stage IB (T2a, N0, M0) non-small cell lung cancer, poorly differentiated invasive squamous cell carcinoma diagnosed in April 2018  PRIOR THERAPY: Status post left upper lobectomy with lymph node dissection under the care of Dr. Roxan Hockey on 12/12/2016 with tumor size of 3.2 cm.  CURRENT THERAPY: Observation.  INTERVAL HISTORY: [name] 81 y.o. male returns to the clinic today for follow-up visit accompanied by his wife.  The patient is feeling fine today with no concerning complaints except for occasional shortness of breath with exertion but no significant chest pain, cough or hemoptysis.  He has no nausea, vomiting, diarrhea or constipation.  He has no headache or visual changes.  He denied having any fever or chills.  He denied having any significant weight loss or night sweats.  He was found to have a suspicious nodule in the left lung and the patient had a PET scan performed recently and he is here for evaluation and discussion of his scan results.  MEDICAL HISTORY: Past Medical History:  Diagnosis Date   Anxiety    pt. reports that he " could swing from the ......" because he is so anxious about the surgery    Arthritis    R hand tendonitis    COPD (chronic obstructive pulmonary disease) ([place])    Depression    Dyspnea    History of kidney stones    Hyperlipidemia    lung ca dx'd 10/2016   Pneumonia    Sleep apnea    cannot tolerate cpap    ALLERGIES:  is allergic to nsaids, gadolinium derivatives, and penicillins.  MEDICATIONS:  Current Outpatient Medications  Medication Sig Dispense Refill   acetaminophen (TYLENOL) 325 MG tablet Take 2 tablets (650 mg total) by mouth every 6 (six) hours as needed for mild pain (or Fever >/= 101).     albuterol (PROVENTIL) (2.5 MG/3ML) 0.083% nebulizer solution  Take 3 mLs (2.5 mg total) by nebulization every 2 (two) hours as needed for wheezing. 75 mL 3   albuterol (VENTOLIN HFA) 108 (90 Base) MCG/ACT inhaler Inhale 2 puffs into the lungs every 6 (six) hours as needed for wheezing or shortness of breath. 8 g 6   cilostazol (PLETAL) 50 MG tablet Take 1 tablet (50 mg total) by mouth 2 (two) times daily. 60 tablet 2   fexofenadine (ALLEGRA) 180 MG tablet Take 180 mg by mouth daily as needed for allergies or rhinitis.     Fluticasone-Umeclidin-Vilant (TRELEGY ELLIPTA) 100-62.5-25 MCG/INH AEPB Inhale 1 puff into the lungs daily. 14 each 0   ipratropium-albuterol (DUONEB) 0.5-2.5 (3) MG/3ML SOLN Take 3 mLs by nebulization every 6 (six) hours as needed.     sertraline (ZOLOFT) 100 MG tablet Take 100 mg by mouth daily.     simvastatin (ZOCOR) 20 MG tablet Take 20 mg by mouth every evening.     traZODone (DESYREL) 50 MG tablet Take 50 mg by mouth at bedtime.   1   No current facility-administered medications for this visit.    SURGICAL HISTORY:  Past Surgical History:  Procedure Laterality Date   COLONOSCOPY W/ POLYPECTOMY     cyst back  2011   CYSTOSCOPY  2012   for bleeding in bladder x 3   INTERCOSTAL NERVE BLOCK Right 10/28/2019   Procedure: Intercostal Nerve Block;  Surgeon: [name], MD;  Location: [facility];  Service: Thoracic;  Laterality: Right;   MOUTH SURGERY     NODE DISSECTION  10/28/2019   Procedure: Node Dissection;  Surgeon: [name], MD;  Location: [facility];  Service: Thoracic;;   VIDEO ASSISTED THORACOSCOPY (VATS)/ LOBECTOMY Left 12/12/2016   Procedure: VIDEO ASSISTED THORACOSCOPY (VATS)/LEFT UPPER LOBECTOMY;  Surgeon: [name], MD;  Location: [facility];  Service: Thoracic;  Laterality: Left;   VIDEO BRONCHOSCOPY Right 10/28/2019   VIDEO BRONCHOSCOPY WITH ENDOBRONCHIAL NAVIGATION (N/A )    VIDEO BRONCHOSCOPY WITH ENDOBRONCHIAL NAVIGATION N/A 10/28/2019   Procedure: VIDEO BRONCHOSCOPY WITH ENDOBRONCHIAL  NAVIGATION;  Surgeon: [name], MD;  Location: [facility];  Service: Thoracic;  Laterality: N/A;    REVIEW OF SYSTEMS:  A comprehensive review of systems was negative except for: Respiratory: positive for dyspnea on exertion   PHYSICAL EXAMINATION: General appearance: alert, cooperative, and no distress Head: Normocephalic, without obvious abnormality, atraumatic Neck: no adenopathy, no JVD, supple, symmetrical, trachea midline, and thyroid not enlarged, symmetric, no tenderness/mass/nodules Lymph nodes: Cervical, supraclavicular, and axillary nodes normal. Resp: clear to auscultation bilaterally Back: symmetric, no curvature. ROM normal. No CVA tenderness. Cardio: regular rate and rhythm, S1, S2 normal, no murmur, click, rub or gallop GI: soft, non-tender; bowel sounds normal; no masses,  no organomegaly Extremities: extremities normal, atraumatic, no cyanosis or edema  ECOG PERFORMANCE STATUS: 1 - Symptomatic but completely ambulatory  Blood pressure (!) 154/73, pulse 78, temperature 97.8 F (36.6 C), temperature source Oral, resp. rate 18, height 5\' 6"  (1.676 m), weight 189 lb 12.8 oz (86.1 kg), SpO2 96 %.  LABORATORY DATA: Lab Results  Component Value Date   WBC 11.1 (H) 01/21/2022   HGB 15.7 01/21/2022   HCT 46.1 01/21/2022   MCV 91.1 01/21/2022   PLT 286 01/21/2022      Chemistry      Component Value Date/Time   NA 139 01/21/2022 1315   NA 139 07/17/2017 1049   K 4.6 01/21/2022 1315   K 4.4 07/17/2017 1049   CL 104 01/21/2022 1315   CO2 28 01/21/2022 1315   CO2 29 07/17/2017 1049   BUN 16 01/21/2022 1315   BUN 14.8 07/17/2017 1049   CREATININE 0.80 01/21/2022 1403   CREATININE 0.77 01/21/2022 1315   CREATININE 0.8 07/17/2017 1049      Component Value Date/Time   CALCIUM 9.9 01/21/2022 1315   CALCIUM 9.3 07/17/2017 1049   ALKPHOS 64 01/21/2022 1315   ALKPHOS 81 07/17/2017 1049   AST 11 (L) 01/21/2022 1315   AST 16 07/17/2017 1049   ALT 13 01/21/2022  1315   ALT 22 07/17/2017 1049   BILITOT 0.6 01/21/2022 1315   BILITOT 0.55 07/17/2017 1049       RADIOGRAPHIC STUDIES: PCV LOWER ARTERIAL (BILATERAL)  Result Date: 02/06/2022 Lower Extremity Arterial Duplex 02/02/2022: No hemodynamically significant stenoses are identified in the right and left lower extremity arterial system. Mild soft plaque noted in bilateral CFA. This exam reveals normal perfusion of the right lower extremity (ABI 1.07) with normal triphasic waveform pattern at the ankle.  This exam reveals normal perfusion of the left lower extremity (ABI 1.03) normal triphasic waveform pattern at the ankle.   NM PET Image Restage (PS) Skull Base to Thigh (F-18 FDG)  Result Date: 02/05/2022 CLINICAL DATA:  Subsequent treatment strategy for non-small cell lung cancer. EXAM: NUCLEAR MEDICINE PET SKULL BASE TO THIGH TECHNIQUE: 9.27 mCi F-18 FDG was injected intravenously. Full-ring PET imaging was performed from  the skull base to thigh after the radiotracer. CT data was obtained and used for attenuation correction and anatomic localization. Fasting blood glucose: 116 mg/dl COMPARISON:  CT chest 01/21/2022 and PET-CT 06/12/2019 FINDINGS: Mediastinal blood pool activity: SUV max 2.89 Liver activity: SUV max NA NECK: No hypermetabolic lymph nodes in the neck. Incidental CT findings: Nodule arising off the inferior pole of the right lobe of thyroid gland with substernal extension measures 2.5 cm with mild diffuse low level FDG uptake, SUV max 3.74, image 53/4. CHEST: No tracer avid axillary, supraclavicular, mediastinal, or hilar lymph nodes. Postsurgical changes from wedge resection within the left upper lobe and posterior right upper lobe identified. Persistent small nodule within the superior segment of left lower lobe measures 7 mm with SUV max 0.70, image 61/4. Previously this measured 5 mm. No tracer avid pulmonary nodules identified on today's exam. Incidental CT findings: Emphysema. Aortic  atherosclerosis and coronary artery calcifications. ABDOMEN/PELVIS: No abnormal hypermetabolic activity within the liver, pancreas, adrenal glands, or spleen. No hypermetabolic lymph nodes in the abdomen or pelvis. Prostate gland appears enlarged as before. Increased uptake within the posterior gland has an SUV max of 7.16, nonspecific. Incidental CT findings: Aortic atherosclerosis. Distal colonic diverticulosis noted without signs of acute inflammation. SKELETON: No focal hypermetabolic activity to suggest skeletal metastasis. Incidental CT findings: None IMPRESSION: 1. Again seen is small, persistent nodule within superior segment of left lower lobe which measures 7 mm on today's study. Formally this measured 5 mm. Although technically too small to reliably characterize there is very low level FDG uptake associated with this nodule within SUV max of 0.70. Continued attention in this nodule on follow-up imaging is advised. 2. Similar appearance of nodule arising off the inferior pole of right lobe of thyroid gland measuring 2.5 cm with mild FDG uptake (SUV max of 3.74). Recommend thyroid US and biopsy (ref: J Am Coll Radiol. 2015 Feb;12(2): 143-50). 3. Prostate gland enlargement is noted. Similar to previous exam there is increased uptake within the posterior aspect of the gland where large calcifications are identified. Correlation with PSA values may be helpful. 4. Aortic Atherosclerosis (ICD10-I70.0) and Emphysema (ICD10-J43.9). 5. Coronary artery calcifications. Electronically Signed   By: Kerby Moors M.D.   On: 02/05/2022 18:39   CT Chest W Contrast  Result Date: 01/24/2022 CLINICAL DATA:  Non-small cell lung cancer restaging. * Tracking Code: BO * EXAM: CT CHEST WITH CONTRAST TECHNIQUE: Multidetector CT imaging of the chest was performed during intravenous contrast administration. RADIATION DOSE REDUCTION: This exam was performed according to the departmental dose-optimization program which includes  automated exposure control, adjustment of the mA and/or kV according to patient size and/or use of iterative reconstruction technique. CONTRAST:  5mL OMNIPAQUE IOHEXOL 300 MG/ML  SOLN COMPARISON:  01/26/2021 FINDINGS: Cardiovascular: Coronary, aortic arch, and branch vessel atherosclerotic vascular disease. Prominent main pulmonary artery likely representing pulmonary arterial hypertension. Mediastinum/Nodes: 2.4 cm right thyroid nodule, not previously hypermetabolic by PET-CT. In the setting of significant comorbidities or limited life expectancy, no follow-up recommended unless clinically indicated (ref: J Am Coll Radiol. 2015 Feb;12(2): 143-50).No pathologic thoracic adenopathy. Lungs/Pleura: Mild biapical pleuroparenchymal scarring. Wedge resection findings in both upper lobes. 8 by 6 by 7 mm (volume = 175 mm^3) nodule in the superior segment left lower lobe on image 40 series 4, this previously measured 6 by 5 by 5 mm (volume = 75 mm^3) on 07/17/2017 and 7 by 5 by 7 mm (volume = 125 mm^3) on 01/26/2021. The ground-glass density nodule of  concern in the right lower lobe has intervally cleared thus was likely inflammatory. Emphysema noted. Upper Abdomen: Subtle foci of enhancement in the liver are not substantially changed from prior exams such as 07/17/2017, probably represent vascular phenomenon or hemangiomas. Periampullary duodenal diverticulum. Atherosclerosis is present, including aortoiliac atherosclerotic disease. Musculoskeletal: Thoracic spondylosis. IMPRESSION: 1. Slowly enlarging superior segment left lower lobe nodule, currently 8 by 6 by 7 mm. This has a little bit more than doubled in volume since 2018. Low-grade adenocarcinoma is a distinct possibility. 2. The ground-glass density nodule of concern in the right lower lobe previously has completely resolved. 3. Other imaging findings of potential clinical significance: Aortic Atherosclerosis (ICD10-I70.0). Coronary atherosclerosis. Suspected  pulmonary arterial hypertension. Emphysema (ICD10-J43.9). Electronically Signed   By: Van Clines M.D.   On: 01/24/2022 10:11     ASSESSMENT AND PLAN: This is a very pleasant 81 years old white male recently diagnosed with a stage IB non-small cell lung cancer, squamous cell carcinoma presented with left upper lobe lung mass status post left upper lobectomy with lymph node dissection. The tumor size was 3.2 cm. There is no evidence for visceral pleural or lymphovascular invasion. The patient has been in observation for several years and doing fine. His last CT scan of the chest showed a suspicious slowly enlarging superior segment left lower lobe nodule.  I ordered a PET scan which was performed recently and it showed small persistent nodule within the superior segment of the left lower lobe that is too small to reliably characterized and it showed very low FDG uptake with SUV max of 0.7.  It was recommended to continue monitoring it closely. I discussed the scan results with the patient and his wife and recommended for him to continue on observation with repeat CT scan of the chest in 4 months for further evaluation of this lesion. The patient was advised to call immediately if he has any other concerning symptoms in the interval. The patient voices understanding of current disease status and treatment options and is in agreement with the current care plan. All questions were answered. The patient knows to call the clinic with any problems, questions or concerns. We can certainly see the patient much sooner if necessary.  Disclaimer: This note was dictated with voice recognition software. Similar sounding words can inadvertently be transcribed and may not be corrected upon review.

## 2022-03-01 ENCOUNTER — Other Ambulatory Visit: Payer: Self-pay | Admitting: *Deleted

## 2022-03-01 NOTE — Patient Outreach (Signed)
[facility]) Care Management  03/01/2022  [name] [birth year] [id number]  Nurse spoke with patient to inform them that their PCP has a RN CM who will follow up with them in the future. Patient verbalized understanding and was appreciative regarding the care this nurse has provided to him.   Plan: RN Health Coach will close case.    [name] RN, BSN [place] [phone] [email or web address]

## 2022-03-14 ENCOUNTER — Ambulatory Visit: Payer: Medicare Other | Admitting: Cardiology

## 2022-03-15 ENCOUNTER — Ambulatory Visit: Payer: Medicare Other | Admitting: Thoracic Surgery (Cardiothoracic Vascular Surgery)

## 2022-03-16 ENCOUNTER — Ambulatory Visit: Payer: Self-pay | Admitting: *Deleted

## 2022-03-18 ENCOUNTER — Ambulatory Visit: Payer: Medicare Other | Admitting: Cardiology

## 2022-03-18 ENCOUNTER — Encounter: Payer: Self-pay | Admitting: Cardiology

## 2022-03-18 VITALS — BP 128/78 | HR 76 | Temp 97.7°F | Resp 16 | Ht 66.0 in | Wt 187.0 lb

## 2022-03-18 DIAGNOSIS — E7800 Pure hypercholesterolemia, unspecified: Secondary | ICD-10-CM | POA: Diagnosis not present

## 2022-03-18 DIAGNOSIS — I739 Peripheral vascular disease, unspecified: Secondary | ICD-10-CM

## 2022-03-18 MED ORDER — CILOSTAZOL 50 MG PO TABS: 50.0000 mg | ORAL_TABLET | Freq: Two times a day (BID) | ORAL | 3 refills | Status: AC

## 2022-03-18 NOTE — Progress Notes (Signed)
Primary Physician/Referring:  [name], MD  Patient ID: [name], male    DOB: May 03, 1941, 81 y.o.   MRN: [id number]  No chief complaint on file.  HPI:    [name]  is a 81 y.o.  Caucasian male patient with COPD, hyperlipidemia, upper lobe lobectomy bilateral, left in 2018 and right in 2021 by video-assisted bronchoscopy by Dr. Roxan Hockey for primary lung cancer, tobacco use disorder quit in 2017 presents here for follow-up of symptoms of claudication involving bilateral calves.  He is lower extremity arterial duplex reveals small vessel disease.  I have started him on Pletal and he has noticed marked improvement in symptoms of claudication in fact he states he does not have any further claudication or cramping in his legs at all.   No change in dyspnea, no change in occasional wheezing, no leg edema.   Past Medical History:  Diagnosis Date   Anxiety    pt. reports that he " could swing from the ......" because he is so anxious about the surgery    Arthritis    R hand tendonitis    COPD (chronic obstructive pulmonary disease) ([place])    Depression    Dyspnea    History of kidney stones    Hyperlipidemia    lung ca dx'd 10/2016   Pneumonia    Sleep apnea    cannot tolerate cpap   Social History   Tobacco Use   Smoking status: Former    Packs/day: 1.50    Years: 65.00    Total pack years: 97.50    Types: Cigarettes    Start date: 08/15/1950    Quit date: 11/14/2015    Years since quitting: 6.3   Smokeless tobacco: Never  Substance Use Topics   Alcohol use: No    Comment: occasional beer   Marital Status: Married  ROS  Review of Systems  Cardiovascular:  Positive for dyspnea on exertion. Negative for chest pain, claudication and leg swelling.  Respiratory:  Positive for wheezing (occasional).    Objective  Blood pressure 128/78, pulse 76, temperature 97.7 F (36.5 C), temperature source Temporal, resp. rate 16, height 5\' 6"  (1.676 m), weight 187 lb (84.8  kg), SpO2 95 %. Body mass index is 30.18 kg/m.     03/18/2022   12:00 PM 02/14/2022    1:51 PM 01/25/2022    1:37 PM  Vitals with BMI  Height 5\' 6"  5\' 6"  5\' 6"   Weight 187 lbs 189 lbs 13 oz 188 lbs 2 oz  BMI 30.2 40.98 11.91  Systolic 478 295 621  Diastolic 78 73 67  Pulse 76 78 87    Physical Exam Constitutional:      Appearance: He is obese.  Neck:     Vascular: No carotid bruit or JVD.  Cardiovascular:     Rate and Rhythm: Normal rate and regular rhythm.     Pulses: Intact distal pulses.          Femoral pulses are 2+ on the right side and 2+ on the left side.      Popliteal pulses are 2+ on the right side and 2+ on the left side.       Dorsalis pedis pulses are 1+ on the right side and 1+ on the left side.       Posterior tibial pulses are 2+ on the right side and 2+ on the left side.     Heart sounds: Normal heart sounds. No murmur heard.  No gallop.  Pulmonary:     Effort: Pulmonary effort is normal.     Breath sounds: Normal breath sounds.  Abdominal:     General: Bowel sounds are normal.     Palpations: Abdomen is soft.  Musculoskeletal:     Right lower leg: No edema.     Left lower leg: No edema.    Medications and allergies   Allergies  Allergen Reactions   Nsaids Other (See Comments)    MD RESTRICTS USE OF ANY "BLOOD THINING" MEDS [ "DUE TO GROWTH IN BLADDER" ]    Gadolinium Derivatives Nausea And Vomiting and Other (See Comments)    CHILLS SHAKING FEELING COLD   Penicillins Other (See Comments)    "Feels like my skin is crawling"  Has patient had a PCN reaction causing immediate rash, facial/tongue/throat swelling, SOB or lightheadedness with hypotension: No Has patient had a PCN reaction causing severe rash involving mucus membranes or skin necrosis: No Has patient had a PCN reaction that required hospitalization No Has patient had a PCN reaction occurring within the last 10 years: No If all of the above answers are "NO", then may proceed with  Cephalosporin use.      Medication list after today's encounter   Current Outpatient Medications:    acetaminophen (TYLENOL) 325 MG tablet, Take 2 tablets (650 mg total) by mouth every 6 (six) hours as needed for mild pain (or Fever >/= 101)., Disp: , Rfl:    albuterol (PROVENTIL) (2.5 MG/3ML) 0.083% nebulizer solution, Take 3 mLs (2.5 mg total) by nebulization every 2 (two) hours as needed for wheezing., Disp: 75 mL, Rfl: 3   albuterol (VENTOLIN HFA) 108 (90 Base) MCG/ACT inhaler, Inhale 2 puffs into the lungs every 6 (six) hours as needed for wheezing or shortness of breath., Disp: 8 g, Rfl: 6   fexofenadine (ALLEGRA) 180 MG tablet, Take 180 mg by mouth daily as needed for allergies or rhinitis., Disp: , Rfl:    Fluticasone-Umeclidin-Vilant (TRELEGY ELLIPTA) 100-62.5-25 MCG/INH AEPB, Inhale 1 puff into the lungs daily., Disp: 14 each, Rfl: 0   ipratropium-albuterol (DUONEB) 0.5-2.5 (3) MG/3ML SOLN, Take 3 mLs by nebulization every 6 (six) hours as needed., Disp: , Rfl:    sertraline (ZOLOFT) 100 MG tablet, Take 100 mg by mouth daily., Disp: , Rfl:    simvastatin (ZOCOR) 20 MG tablet, Take 20 mg by mouth every evening., Disp: , Rfl:    traZODone (DESYREL) 50 MG tablet, Take 50 mg by mouth at bedtime. , Disp: , Rfl: 1   cilostazol (PLETAL) 50 MG tablet, Take 1 tablet (50 mg total) by mouth 2 (two) times daily., Disp: 180 tablet, Rfl: 3  Laboratory examination:   Recent Labs    01/21/22 1315 01/21/22 1403  NA 139  --   K 4.6  --   CL 104  --   CO2 28  --   GLUCOSE 104*  --   BUN 16  --   CREATININE 0.77 0.80  CALCIUM 9.9  --   GFRNONAA >60  --    CrCl cannot be calculated (Patient's most recent lab result is older than the maximum 21 days allowed.).     Latest Ref Rng & Units 01/21/2022    2:03 PM 01/21/2022    1:15 PM 01/26/2021   11:25 AM  CMP  Glucose 70 - 99 mg/dL  104  102   BUN 8 - 23 mg/dL  16  13   Creatinine 0.61 - 1.24 mg/dL 0.80  0.77  0.74   Sodium 135 - 145 mmol/L   139  140   Potassium 3.5 - 5.1 mmol/L  4.6  4.6   Chloride 98 - 111 mmol/L  104  103   CO2 22 - 32 mmol/L  28  28   Calcium 8.9 - 10.3 mg/dL  9.9  9.7   Total Protein 6.5 - 8.1 g/dL  7.1  7.0   Total Bilirubin 0.3 - 1.2 mg/dL  0.6  0.5   Alkaline Phos 38 - 126 U/L  64  78   AST 15 - 41 U/L  11  13   ALT 0 - 44 U/L  13  15       Latest Ref Rng & Units 01/21/2022    1:15 PM 01/26/2021   11:25 AM 01/27/2020   11:24 AM  CBC  WBC 4.0 - 10.5 K/uL 11.1  9.9  10.8   Hemoglobin 13.0 - 17.0 g/dL 15.7  15.9  15.0   Hematocrit 39.0 - 52.0 % 46.1  45.9  45.4   Platelets 150 - 400 K/uL 286  281  334    External labs:   Cholesterol, total 117.000 m 09/24/2020 HDL 38.000 mg 09/24/2020 LDL 56.000 mg 09/24/2020 Triglycerides 113.000 m 09/24/2020  TSH 0.330 09/24/2020  Radiology:   CT chest 01/26/2021: 1. Interval resolution of previously demonstrated small right pleural effusion. 2. New small ground-glass nodule in the right lower lobe, likely inflammatory. Attention on follow-up recommended. Additional scattered pulmonary nodules are stable. 3. Coronary and Aortic Atherosclerosis (ICD10-I70.0). Emphysema (ICD10-J43.9). Cardiovascular: Diffuse atherosclerosis of the aorta, great vessels and coronary arteries. No acute vascular findings are demonstrated. There is stable mild central enlargement of the pulmonary arteries. The heart size is normal. There is no pericardial effusion.  Cardiac Studies:   ABI 08/24/2018: Right  1.15       0.72       1.21        0.82          +-------+-----------+-----------+------------+------------+  Left   1.16       0.75       1.09        0.77   PCV ECHOCARDIOGRAM COMPLETE 12/13/2021  Study Quality: Technically difficult study. Normal LV systolic function with visual EF 55-60%. Left ventricle cavity is normal in size. Mild left ventricular hypertrophy. Normal global wall motion. Normal diastolic filling pattern, normal LAP. No significant valvular  heart disease. No prior study for comparison.    PCV MYOCARDIAL PERFUSION WITH LEXISCAN 12/15/2021  Nondiagnostic ECG stress. The heart rate response was consistent with Lexiscan. Myocardial perfusion is normal. Overall LV systolic function is normal without regional wall motion abnormalities. Stress LV EF: 51%. No previous exam available for comparison. Low risk.  Lower Extremity Arterial Duplex 02/02/2022: No hemodynamically significant stenoses are identified in the right and left lower extremity arterial system.  Mild soft plaque noted in bilateral CFA. This exam reveals normal perfusion of the right lower extremity (ABI 1.07) with normal triphasic waveform pattern at the ankle.   This exam reveals normal perfusion of the left lower extremity (ABI 1.03) normal triphasic waveform pattern at the ankle.  EKG:   EKG 12/08/2021: Normal sinus rhythm at rate of 74 bpm, normal axis.  Right bundle branch block.  Nonspecific inferolateral ST-T abnormality.  Low-voltage complexes.  Compared to 10/24/2019, ST-T changes in the lateral leads is new.  Assessment     ICD-10-CM   1. Claudication of calf muscles ([place])  I73.9 cilostazol (PLETAL) 50 MG tablet    2. Pure hypercholesterolemia  E78.00        Medications Discontinued During This Encounter  Medication Reason   cilostazol (PLETAL) 50 MG tablet Reorder     Meds ordered this encounter  Medications   cilostazol (PLETAL) 50 MG tablet    Sig: Take 1 tablet (50 mg total) by mouth 2 (two) times daily.    Dispense:  180 tablet    Refill:  3   No orders of the defined types were placed in this encounter.  Recommendations:   [name] is a 81 y.o.  Caucasian male patient with COPD, hyperlipidemia, upper lobe lobectomy bilateral, left in 2018 and right in 2021 by video-assisted bronchoscopy by Dr. Roxan Hockey for primary lung cancer, tobacco use disorder quit in 2017 presents here for follow-up of symptoms of claudication involving  bilateral calves.  He is lower extremity arterial duplex reveals small vessel disease.  I have started him on Pletal and he has noticed marked improvement in symptoms of claudication in fact he states he does not have any further claudication or cramping in his legs at all.  Hence I refilled the medications.  With regard to cardiac risk factors, lipids are under excellent control with present medical regimen.  Overall he has had a low risk nuclear stress test and a normal LV function by echocardiogram with no significant valvular abnormality.  Hence I have recommended that he continue with primary prevention, I will see him back on a as needed basis.     Adrian Prows, MD, [facility] 03/18/2022, 12:27 PM Office: [phone]

## 2022-03-25 ENCOUNTER — Telehealth: Payer: Self-pay | Admitting: Pulmonary Disease

## 2022-03-25 MED ORDER — BREZTRI AEROSPHERE 160-9-4.8 MCG/ACT IN AERO
2.0000 | INHALATION_SPRAY | Freq: Two times a day (BID) | RESPIRATORY_TRACT | 5 refills | Status: DC
Start: 2022-03-25 — End: 2023-03-30

## 2022-03-25 MED ORDER — AEROCHAMBER MV MISC: 0 refills | Status: DC

## 2022-03-25 NOTE — Telephone Encounter (Signed)
Called and spoke with patient. Patient stated that it's time for a refill on his trelegy but it's making him very hoarse. Patient wants to know if there is something else he can try or if he should continue taking the trelegy or not.   BI, please advise.

## 2022-03-25 NOTE — Telephone Encounter (Signed)
Icard, Octavio Graves, DO  Fran Lowes, CMA; [facility] Triage Pool 2 minutes ago (4:16 PM)    Ok to switch to breztri with spacer.  Ok to give samples if we have them   BLI     Called and spoke with pt letting him know info per [facility] and he verbalized understanding. Rx for Breztri and spacer have been sent to preferred pharmacy for pt. Nothing further needed.

## 2022-03-29 DIAGNOSIS — H35033 Hypertensive retinopathy, bilateral: Secondary | ICD-10-CM | POA: Diagnosis not present

## 2022-03-29 DIAGNOSIS — H524 Presbyopia: Secondary | ICD-10-CM | POA: Diagnosis not present

## 2022-03-29 DIAGNOSIS — H35362 Drusen (degenerative) of macula, left eye: Secondary | ICD-10-CM | POA: Diagnosis not present

## 2022-03-29 DIAGNOSIS — H31012 Macula scars of posterior pole (postinflammatory) (post-traumatic), left eye: Secondary | ICD-10-CM | POA: Diagnosis not present

## 2022-03-29 DIAGNOSIS — H25813 Combined forms of age-related cataract, bilateral: Secondary | ICD-10-CM | POA: Diagnosis not present

## 2022-04-14 ENCOUNTER — Telehealth: Payer: Self-pay | Admitting: Pulmonary Disease

## 2022-04-14 DIAGNOSIS — J449 Chronic obstructive pulmonary disease, unspecified: Secondary | ICD-10-CM

## 2022-04-15 MED ORDER — AEROCHAMBER MV MISC: 0 refills | Status: AC

## 2022-04-15 NOTE — Telephone Encounter (Signed)
I called the patient to verify that an order was placed to [facility] for the spacer for his inhaler. He voices understanding. Nothing further needed.

## 2022-04-19 ENCOUNTER — Ambulatory Visit: Payer: Self-pay

## 2022-04-19 NOTE — Patient Outreach (Signed)
  Care Coordination   04/19/2022 Name: [name] MRN: [id number] DOB: Apr 04, 1941   Care Coordination Outreach Attempts:  An unsuccessful telephone outreach was attempted for a scheduled appointment today.  Follow Up Plan:  Additional outreach attempts will be made to offer the patient care coordination information and services.   Encounter Outcome:  Pt. Request to Call Back  Care Coordination Interventions Activated:  No   Care Coordination Interventions:  No, not indicated    Barb Merino, RN, BSN, CCM Care Management Coordinator [facility] Direct Phone: [phone]

## 2022-04-27 ENCOUNTER — Ambulatory Visit: Payer: Self-pay

## 2022-04-27 NOTE — Patient Outreach (Signed)
  Care Coordination   Initial Visit Note   04/27/2022 Name: MALIQUE DRISKILL MRN: [id number] DOB: 1940/12/22  [name] is a 80 y.o. year old male who sees Avva, Ravisankar, MD for primary care. I spoke with  [name] by phone today.  What matters to the patients health and wellness today?  Patient would like to work on improving his COPD.     Goals Addressed               This Visit's Progress     Patient Stated     I struggle with shortness of breath when I try to walk long distances (pt-stated)        Care Coordination Interventions: Provided patient with basic written and verbal COPD education on self care/management/and exacerbation prevention Advised patient to track and manage COPD triggers Provided written and verbal instructions on pursed lip breathing and utilized returned demonstration as teach back Provided instruction about proper use of medications used for management of COPD including inhalers Advised patient to self assesses COPD action plan zone and make appointment with provider if in the yellow zone for 48 hours without improvement Advised patient to engage in light exercise as tolerated 3-5 days a week to aid in the the management of COPD Educated patient about the exercise referral exercise program (PREP) Encouraged patient to check with Pulmonology and Cardiology before establishing a routine exercise regimen       SDOH assessments and interventions completed:  No     Care Coordination Interventions Activated:  Yes  Care Coordination Interventions:  Yes, provided   Follow up plan: Follow up call scheduled for 05/25/22 @1 :00 PM     Encounter Outcome:  Pt. Visit Completed

## 2022-04-27 NOTE — Patient Instructions (Signed)
Visit Information  Thank you for taking time to visit with me today. Please don't hesitate to contact me if I can be of assistance to you.   Following are the goals we discussed today:   Goals Addressed               This Visit's Progress     Patient Stated     I struggle with shortness of breath when I try to walk long distances (pt-stated)        Care Coordination Interventions: Provided patient with basic written and verbal COPD education on self care/management/and exacerbation prevention Advised patient to track and manage COPD triggers Provided written and verbal instructions on pursed lip breathing and utilized returned demonstration as teach back Provided instruction about proper use of medications used for management of COPD including inhalers Advised patient to self assesses COPD action plan zone and make appointment with provider if in the yellow zone for 48 hours without improvement Advised patient to engage in light exercise as tolerated 3-5 days a week to aid in the the management of COPD Educated patient about the exercise referral exercise program (PREP) Encouraged patient to check with Pulmonology and Cardiology before establishing a routine exercise regimen           Our next appointment is by telephone on 05/25/22 at 11:00 AM  Please call the care guide team at [phone] if you need to cancel or reschedule your appointment.   If you are experiencing a Mental Health or [place] or need someone to talk to, please call 1-800-273-TALK (toll free, 24 hour hotline)  The patient verbalized understanding of instructions, educational materials, and care plan provided today and agreed to receive a mailed copy of patient instructions, educational materials, and care plan.   Barb Merino, RN, BSN, CCM Care Management Coordinator [facility] Care Management Direct Phone: [phone]

## 2022-05-10 DIAGNOSIS — C3490 Malignant neoplasm of unspecified part of unspecified bronchus or lung: Secondary | ICD-10-CM | POA: Diagnosis not present

## 2022-05-10 DIAGNOSIS — R0602 Shortness of breath: Secondary | ICD-10-CM | POA: Diagnosis not present

## 2022-05-10 DIAGNOSIS — J441 Chronic obstructive pulmonary disease with (acute) exacerbation: Secondary | ICD-10-CM | POA: Diagnosis not present

## 2022-05-10 DIAGNOSIS — I10 Essential (primary) hypertension: Secondary | ICD-10-CM | POA: Diagnosis not present

## 2022-05-10 DIAGNOSIS — R399 Unspecified symptoms and signs involving the genitourinary system: Secondary | ICD-10-CM | POA: Diagnosis not present

## 2022-05-10 DIAGNOSIS — R001 Bradycardia, unspecified: Secondary | ICD-10-CM | POA: Diagnosis not present

## 2022-05-10 DIAGNOSIS — R200 Anesthesia of skin: Secondary | ICD-10-CM | POA: Diagnosis not present

## 2022-05-10 DIAGNOSIS — F329 Major depressive disorder, single episode, unspecified: Secondary | ICD-10-CM | POA: Diagnosis not present

## 2022-05-10 DIAGNOSIS — E669 Obesity, unspecified: Secondary | ICD-10-CM | POA: Diagnosis not present

## 2022-05-10 DIAGNOSIS — G4733 Obstructive sleep apnea (adult) (pediatric): Secondary | ICD-10-CM | POA: Diagnosis not present

## 2022-05-10 DIAGNOSIS — E785 Hyperlipidemia, unspecified: Secondary | ICD-10-CM | POA: Diagnosis not present

## 2022-05-25 ENCOUNTER — Ambulatory Visit: Payer: Self-pay

## 2022-05-25 NOTE — Patient Outreach (Signed)
  Care Coordination   05/25/2022 Name: [name] MRN: [id number] DOB: 1941-08-08   Care Coordination Outreach Attempts:  An unsuccessful telephone outreach was attempted for a scheduled appointment today.  Follow Up Plan:  Additional outreach attempts will be made to offer the patient care coordination information and services.   Encounter Outcome:  Pt. Request to Call Back  Care Coordination Interventions Activated:  No   Care Coordination Interventions:  No, not indicated    Barb Merino, RN, BSN, CCM Care Management Coordinator [facility] Direct Phone: [phone]

## 2022-06-01 ENCOUNTER — Ambulatory Visit: Payer: Self-pay

## 2022-06-01 NOTE — Patient Outreach (Signed)
  Care Coordination   06/01/2022 Name: LESLEY GALENTINE MRN: [id number] DOB: [birth year]   Care Coordination Outreach Attempts:  An unsuccessful telephone outreach was attempted for a scheduled appointment today.  Follow Up Plan:  Additional outreach attempts will be made to offer the patient care coordination information and services.   Encounter Outcome:  No Answer  Care Coordination Interventions Activated:  No   Care Coordination Interventions:  No, not indicated    Barb Merino, RN, BSN, CCM Care Management Coordinator [facility] Care Management  Direct Phone: [phone]

## 2022-06-16 ENCOUNTER — Other Ambulatory Visit: Payer: Self-pay

## 2022-06-16 ENCOUNTER — Ambulatory Visit (HOSPITAL_COMMUNITY)
Admission: RE | Admit: 2022-06-16 | Discharge: 2022-06-16 | Disposition: A | Discharge status: Home / Self Care | Payer: Medicare Other | Source: Ambulatory Visit | Attending: Internal Medicine | Admitting: Internal Medicine

## 2022-06-16 ENCOUNTER — Inpatient Hospital Stay: Payer: Medicare Other | Attending: Internal Medicine

## 2022-06-16 DIAGNOSIS — C3490 Malignant neoplasm of unspecified part of unspecified bronchus or lung: Secondary | ICD-10-CM

## 2022-06-16 DIAGNOSIS — J439 Emphysema, unspecified: Secondary | ICD-10-CM | POA: Diagnosis not present

## 2022-06-16 DIAGNOSIS — R918 Other nonspecific abnormal finding of lung field: Secondary | ICD-10-CM | POA: Insufficient documentation

## 2022-06-16 DIAGNOSIS — J9809 Other diseases of bronchus, not elsewhere classified: Secondary | ICD-10-CM | POA: Diagnosis not present

## 2022-06-16 DIAGNOSIS — C3412 Malignant neoplasm of upper lobe, left bronchus or lung: Secondary | ICD-10-CM | POA: Insufficient documentation

## 2022-06-16 LAB — CBC WITH DIFFERENTIAL (CANCER CENTER ONLY)
Abs Immature Granulocytes: 0.13 10*3/uL — ABNORMAL HIGH (ref 0.00–0.07)
Basophils Absolute: 0.1 10*3/uL (ref 0.0–0.1)
Basophils Relative: 1 %
Eosinophils Absolute: 0.3 10*3/uL (ref 0.0–0.5)
Eosinophils Relative: 3 %
HCT: 47.1 % (ref 39.0–52.0)
Hemoglobin: 16.4 g/dL (ref 13.0–17.0)
Immature Granulocytes: 1 %
Lymphocytes Relative: 10 %
Lymphs Abs: 1 10*3/uL (ref 0.7–4.0)
MCH: 32.1 pg (ref 26.0–34.0)
MCHC: 34.8 g/dL (ref 30.0–36.0)
MCV: 92.2 fL (ref 80.0–100.0)
Monocytes Absolute: 1.5 10*3/uL — ABNORMAL HIGH (ref 0.1–1.0)
Monocytes Relative: 14 %
Neutro Abs: 7.6 10*3/uL (ref 1.7–7.7)
Neutrophils Relative %: 71 %
Platelet Count: 297 10*3/uL (ref 150–400)
RBC: 5.11 MIL/uL (ref 4.22–5.81)
RDW: 13.5 % (ref 11.5–15.5)
WBC Count: 10.5 10*3/uL (ref 4.0–10.5)
nRBC: 0 % (ref 0.0–0.2)

## 2022-06-16 LAB — CMP (CANCER CENTER ONLY)
ALT: 15 U/L (ref 0–44)
AST: 11 U/L — ABNORMAL LOW (ref 15–41)
Albumin: 3.9 g/dL (ref 3.5–5.0)
Alkaline Phosphatase: 69 U/L (ref 38–126)
Anion gap: 3 — ABNORMAL LOW (ref 5–15)
BUN: 15 mg/dL (ref 8–23)
CO2: 33 mmol/L — ABNORMAL HIGH (ref 22–32)
Calcium: 10 mg/dL (ref 8.9–10.3)
Chloride: 103 mmol/L (ref 98–111)
Creatinine: 0.81 mg/dL (ref 0.61–1.24)
GFR, Estimated: >60 mL/min (ref >60)
Glucose, Bld: 94 mg/dL (ref 70–99)
Potassium: 4.4 mmol/L (ref 3.5–5.1)
Sodium: 139 mmol/L (ref 135–145)
Total Bilirubin: 0.4 mg/dL (ref 0.3–1.2)
Total Protein: 7 g/dL (ref 6.5–8.1)

## 2022-06-16 MED ORDER — SODIUM CHLORIDE (PF) 0.9 % IJ SOLN
INTRAMUSCULAR | Status: AC
  Filled 2022-06-16: qty 50

## 2022-06-16 MED ORDER — IOHEXOL 300 MG/ML  SOLN
75.0000 mL | Freq: Once | INTRAMUSCULAR | Status: AC | PRN
  Administered 2022-06-16: 75 mL via INTRAVENOUS

## 2022-06-20 ENCOUNTER — Encounter: Payer: Self-pay | Admitting: Internal Medicine

## 2022-06-20 ENCOUNTER — Inpatient Hospital Stay (HOSPITAL_BASED_OUTPATIENT_CLINIC_OR_DEPARTMENT_OTHER): Payer: Medicare Other | Admitting: Internal Medicine

## 2022-06-20 ENCOUNTER — Other Ambulatory Visit: Payer: Self-pay

## 2022-06-20 VITALS — BP 138/84 | HR 74 | Temp 98.8°F | Wt 187.0 lb

## 2022-06-20 DIAGNOSIS — R918 Other nonspecific abnormal finding of lung field: Secondary | ICD-10-CM | POA: Diagnosis not present

## 2022-06-20 DIAGNOSIS — C3490 Malignant neoplasm of unspecified part of unspecified bronchus or lung: Secondary | ICD-10-CM | POA: Diagnosis not present

## 2022-06-20 DIAGNOSIS — C3412 Malignant neoplasm of upper lobe, left bronchus or lung: Secondary | ICD-10-CM | POA: Diagnosis not present

## 2022-06-20 NOTE — Progress Notes (Signed)
[facility] [phone]   [phone]  OFFICE PROGRESS NOTE  [name], MD [place] [address]  DIAGNOSIS: Stage IB (T2a, N0, M0) non-small cell lung cancer, poorly differentiated invasive squamous cell carcinoma diagnosed in April 2018  PRIOR THERAPY: Status post left upper lobectomy with lymph node dissection under the care of Dr. Roxan Hockey on 12/12/2016 with tumor size of 3.2 cm.  CURRENT THERAPY: Observation.  INTERVAL HISTORY: [name] 81 y.o. male returns to the clinic today for follow-up visit.  The patient is feeling fine today with no concerning complaints except for the baseline fatigue.  He has no current chest pain, shortness of breath, cough or hemoptysis.  He has no nausea, vomiting, diarrhea or constipation.  He has no headache or visual changes.  He is here today for evaluation and repeat CT scan of the chest for restaging of his disease.  MEDICAL HISTORY: Past Medical History:  Diagnosis Date   Anxiety    pt. reports that he " could swing from the ......" because he is so anxious about the surgery    Arthritis    R hand tendonitis    COPD (chronic obstructive pulmonary disease) ([place])    Depression    Dyspnea    History of kidney stones    Hyperlipidemia    lung ca dx'd 10/2016   Pneumonia    Sleep apnea    cannot tolerate cpap    ALLERGIES:  is allergic to nsaids, gadolinium derivatives, and penicillins.  MEDICATIONS:  Current Outpatient Medications  Medication Sig Dispense Refill   acetaminophen (TYLENOL) 325 MG tablet Take 2 tablets (650 mg total) by mouth every 6 (six) hours as needed for mild pain (or Fever >/= 101).     albuterol (PROVENTIL) (2.5 MG/3ML) 0.083% nebulizer solution Take 3 mLs (2.5 mg total) by nebulization every 2 (two) hours as needed for wheezing. 75 mL 3   albuterol (VENTOLIN HFA) 108 (90 Base) MCG/ACT inhaler Inhale 2 puffs into the lungs every 6 (six) hours as  needed for wheezing or shortness of breath. 8 g 6   Budeson-Glycopyrrol-Formoterol (BREZTRI AEROSPHERE) 160-9-4.8 MCG/ACT AERO Inhale 2 puffs into the lungs in the morning and at bedtime. 10.2 g 5   cilostazol (PLETAL) 50 MG tablet Take 1 tablet (50 mg total) by mouth 2 (two) times daily. 180 tablet 3   fexofenadine (ALLEGRA) 180 MG tablet Take 180 mg by mouth daily as needed for allergies or rhinitis.     ipratropium-albuterol (DUONEB) 0.5-2.5 (3) MG/3ML SOLN Take 3 mLs by nebulization every 6 (six) hours as needed.     sertraline (ZOLOFT) 100 MG tablet Take 100 mg by mouth daily.     simvastatin (ZOCOR) 20 MG tablet Take 20 mg by mouth every evening.     Spacer/Aero-Holding Chambers (AEROCHAMBER MV) inhaler Use as instructed 1 each 0   traZODone (DESYREL) 50 MG tablet Take 50 mg by mouth at bedtime.   1   No current facility-administered medications for this visit.    SURGICAL HISTORY:  Past Surgical History:  Procedure Laterality Date   COLONOSCOPY W/ POLYPECTOMY     cyst back  2011   CYSTOSCOPY  2012   for bleeding in bladder x 3   INTERCOSTAL NERVE BLOCK Right 10/28/2019   Procedure: Intercostal Nerve Block;  Surgeon: [name], MD;  Location: [facility];  Service: Thoracic;  Laterality: Right;   MOUTH SURGERY  NODE DISSECTION  10/28/2019   Procedure: Node Dissection;  Surgeon: [name], MD;  Location: [facility];  Service: Thoracic;;   VIDEO ASSISTED THORACOSCOPY (VATS)/ LOBECTOMY Left 12/12/2016   Procedure: VIDEO ASSISTED THORACOSCOPY (VATS)/LEFT UPPER LOBECTOMY;  Surgeon: [name], MD;  Location: [facility];  Service: Thoracic;  Laterality: Left;   VIDEO BRONCHOSCOPY Right 10/28/2019   VIDEO BRONCHOSCOPY WITH ENDOBRONCHIAL NAVIGATION (N/A )    VIDEO BRONCHOSCOPY WITH ENDOBRONCHIAL NAVIGATION N/A 10/28/2019   Procedure: VIDEO BRONCHOSCOPY WITH ENDOBRONCHIAL NAVIGATION;  Surgeon: [name], MD;  Location: [facility];  Service: Thoracic;  Laterality:  N/A;    REVIEW OF SYSTEMS:  A comprehensive review of systems was negative except for: Constitutional: positive for fatigue   PHYSICAL EXAMINATION: General appearance: alert, cooperative, fatigued, and no distress Head: Normocephalic, without obvious abnormality, atraumatic Neck: no adenopathy, no carotid bruit, supple, symmetrical, trachea midline, and thyroid not enlarged, symmetric, no tenderness/mass/nodules Lymph nodes: Cervical, supraclavicular, and axillary nodes normal. Resp: clear to auscultation bilaterally Back: symmetric, no curvature. ROM normal. No CVA tenderness. Cardio: regular rate and rhythm, S1, S2 normal, no murmur, click, rub or gallop GI: soft, non-tender; bowel sounds normal; no masses,  no organomegaly Extremities: extremities normal, atraumatic, no cyanosis or edema  ECOG PERFORMANCE STATUS: 1 - Symptomatic but completely ambulatory  Blood pressure 138/84, pulse 74, temperature 98.8 F (37.1 C), temperature source Tympanic, weight 187 lb (84.8 kg), SpO2 93 %.  LABORATORY DATA: Lab Results  Component Value Date   WBC 10.5 06/16/2022   HGB 16.4 06/16/2022   HCT 47.1 06/16/2022   MCV 92.2 06/16/2022   PLT 297 06/16/2022      Chemistry      Component Value Date/Time   NA 139 06/16/2022 1447   NA 139 07/17/2017 1049   K 4.4 06/16/2022 1447   K 4.4 07/17/2017 1049   CL 103 06/16/2022 1447   CO2 33 (H) 06/16/2022 1447   CO2 29 07/17/2017 1049   BUN 15 06/16/2022 1447   BUN 14.8 07/17/2017 1049   CREATININE 0.81 06/16/2022 1447   CREATININE 0.8 07/17/2017 1049      Component Value Date/Time   CALCIUM 10.0 06/16/2022 1447   CALCIUM 9.3 07/17/2017 1049   ALKPHOS 69 06/16/2022 1447   ALKPHOS 81 07/17/2017 1049   AST 11 (L) 06/16/2022 1447   AST 16 07/17/2017 1049   ALT 15 06/16/2022 1447   ALT 22 07/17/2017 1049   BILITOT 0.4 06/16/2022 1447   BILITOT 0.55 07/17/2017 1049       RADIOGRAPHIC STUDIES: CT Chest W Contrast  Result Date:  06/19/2022 CLINICAL DATA:  Non-small cell lung cancer staging, status post wedge resection * Tracking Code: BO * EXAM: CT CHEST WITH CONTRAST TECHNIQUE: Multidetector CT imaging of the chest was performed during intravenous contrast administration. RADIATION DOSE REDUCTION: This exam was performed according to the departmental dose-optimization program which includes automated exposure control, adjustment of the mA and/or kV according to patient size and/or use of iterative reconstruction technique. CONTRAST:  35mL OMNIPAQUE IOHEXOL 300 MG/ML  SOLN COMPARISON:  PET-CT, 02/04/2022, CT chest, 01/21/2022 FINDINGS: Cardiovascular: Aortic atherosclerosis. Normal heart size. Left and right coronary artery calcifications. No pericardial effusion. Mediastinum/Nodes: No enlarged mediastinal, hilar, or axillary lymph nodes. Redemonstrated hypodense nodule of the inferior pole of the right lobe of the thyroid measuring 2.5 x 1.5 cm (series 2, image 17). Trachea, and esophagus demonstrate no significant findings. Lungs/Pleura: Unchanged postoperative appearance of the chest, with bilateral upper lobe  wedge resections and volume loss. No significant change in a nodule of the superior segment left lower lobe measuring 0.8 x 0.6 cm (series 5, image 28). Mild centrilobular emphysema and diffuse bilateral bronchial wall thickening. Mild scarring and or atelectasis of the bilateral lung bases. No pleural effusion or pneumothorax. Upper Abdomen: No acute abnormality. Multiple faintly hyperenhancing foci throughout the liver are unchanged in comparison to multiple prior examinations dating back to at least 2018 and again presumably benign focal nodular hyperplasias, hemangiomata, or perfusion variations. Musculoskeletal: No chest wall abnormality. No acute osseous findings. IMPRESSION: 1. Unchanged postoperative appearance of the chest, with bilateral upper lobe wedge resections. 2. No significant change in a nodule of the superior  segment left lower lobe measuring 0.8 cm. As previously reported, this has very slowly enlarged on examinations dating back to at least 2018, and was previously with suspected low level FDG avidity and remains modestly suspicious for indolent malignancy. Continued attention on follow-up. 3. Redemonstrated hypodense nodule of the inferior pole of the right lobe of the thyroid measuring 2.5 x 1.5 cm. As previously reported this nodule demonstrated FDG avidity on prior PET-CT. Recommend thyroid ultrasound and biopsy if and when clinically appropriate (ref: J Am Coll Radiol. 2015 Feb;12(2): 143-50). 4. Emphysema and diffuse bilateral bronchial wall thickening. 5. Coronary artery disease. Aortic Atherosclerosis (ICD10-I70.0) and Emphysema (ICD10-J43.9). Electronically Signed   By: Delanna Ahmadi M.D.   On: 06/19/2022 21:29     ASSESSMENT AND PLAN: This is a very pleasant 81 years old white male recently diagnosed with a stage IB non-small cell lung cancer, squamous cell carcinoma presented with left upper lobe lung mass status post left upper lobectomy with lymph node dissection. The tumor size was 3.2 cm. There is no evidence for visceral pleural or lymphovascular invasion. The patient is currently on observation and he is feeling fine with no concerning complaints. He had repeat CT scan of the chest performed recently.  I personally and independently reviewed the scan images and discussed the result and showed the images to the patient today. His scan showed no concerning findings for disease recurrence or progression except for stable nodules of the superior segment of the left lower lobe measuring 0.8 cm still suspicious for slowly growing indolent malignancy but it was not FDG avid on the previous PET scan and has been slowly growing since 2018. I recommended for the patient to continue on observation with repeat CT scan of the chest in 6 months.  If this nodule continues to increase in size, may consider  The patient for repeat biopsy followed by SBRT. He was advised to call immediately if he has any other concerning symptoms in the interval. The patient voices understanding of current disease status and treatment options and is in agreement with the current care plan. All questions were answered. The patient knows to call the clinic with any problems, questions or concerns. We can certainly see the patient much sooner if necessary.  Disclaimer: This note was dictated with voice recognition software. Similar sounding words can inadvertently be transcribed and may not be corrected upon review.

## 2022-06-22 NOTE — Progress Notes (Signed)
Rescheduled. Patient changed his phone numbers I have updated them in EPIC.  Thank you   [place]  Direct Dial: [phone]

## 2022-06-28 ENCOUNTER — Ambulatory Visit (INDEPENDENT_AMBULATORY_CARE_PROVIDER_SITE_OTHER): Payer: Medicare Other | Admitting: Thoracic Surgery (Cardiothoracic Vascular Surgery)

## 2022-06-28 ENCOUNTER — Other Ambulatory Visit: Payer: Self-pay | Admitting: Thoracic Surgery (Cardiothoracic Vascular Surgery)

## 2022-06-28 ENCOUNTER — Other Ambulatory Visit: Payer: Self-pay

## 2022-06-28 VITALS — BP 169/85 | HR 74 | Resp 20 | Ht 66.0 in | Wt 189.0 lb

## 2022-06-28 DIAGNOSIS — C3492 Malignant neoplasm of unspecified part of left bronchus or lung: Secondary | ICD-10-CM | POA: Diagnosis not present

## 2022-06-28 DIAGNOSIS — E041 Nontoxic single thyroid nodule: Secondary | ICD-10-CM

## 2022-06-28 NOTE — Progress Notes (Signed)
[address]       ,[place] [address]             [phone]     HPI: Mr. Bumbaugh returns for scheduled follow-up visit to review his recent CT chest.  Abanoub Hanken is an 81 year old man with a history of cancer, tobacco abuse, COPD, hyperlipidemia, aortic and coronary atherosclerosis, sleep apnea, pneumonia, depression, thyroid nodule, and lung nodule.  He had a lingular sparing left upper lobectomy in 2018 for stage Ib squamous cell carcinoma.  He had a wedge resection of the right upper lobe squamous cell carcinoma in March 2021.  He currently is being followed for those lesions as well as a 8 mm solid nodule in the superior segment of the left lower lobe.  I last saw him in July.  He was doing well at that time..  In the interim since his last visit he has been feeling well.  No recent respiratory issues.  Appetite is good and weight is stable.  No chest pain, pressure, or tightness.  Past Medical History:  Diagnosis Date   Anxiety    pt. reports that he " could swing from the ......" because he is so anxious about the surgery    Arthritis    R hand tendonitis    COPD (chronic obstructive pulmonary disease) ([place])    Depression    Dyspnea    History of kidney stones    Hyperlipidemia    lung ca dx'd 10/2016   Pneumonia    Sleep apnea    cannot tolerate cpap    Current Outpatient Medications  Medication Sig Dispense Refill   acetaminophen (TYLENOL) 325 MG tablet Take 2 tablets (650 mg total) by mouth every 6 (six) hours as needed for mild pain (or Fever >/= 101).     albuterol (PROVENTIL) (2.5 MG/3ML) 0.083% nebulizer solution Take 3 mLs (2.5 mg total) by nebulization every 2 (two) hours as needed for wheezing. 75 mL 3   albuterol (VENTOLIN HFA) 108 (90 Base) MCG/ACT inhaler Inhale 2 puffs into the lungs every 6 (six) hours as needed for wheezing or shortness of breath. 8 g 6   Budeson-Glycopyrrol-Formoterol (BREZTRI AEROSPHERE) 160-9-4.8 MCG/ACT AERO  Inhale 2 puffs into the lungs in the morning and at bedtime. 10.2 g 5   cilostazol (PLETAL) 50 MG tablet Take 1 tablet (50 mg total) by mouth 2 (two) times daily. 180 tablet 3   fexofenadine (ALLEGRA) 180 MG tablet Take 180 mg by mouth daily as needed for allergies or rhinitis.     ipratropium-albuterol (DUONEB) 0.5-2.5 (3) MG/3ML SOLN Take 3 mLs by nebulization every 6 (six) hours as needed.     sertraline (ZOLOFT) 100 MG tablet Take 100 mg by mouth daily.     simvastatin (ZOCOR) 20 MG tablet Take 20 mg by mouth every evening.     Spacer/Aero-Holding Chambers (AEROCHAMBER MV) inhaler Use as instructed 1 each 0   traZODone (DESYREL) 50 MG tablet Take 50 mg by mouth at bedtime.   1   No current facility-administered medications for this visit.    Physical Exam BP (!) 169/85 (BP Location: Left Arm, Patient Position: Sitting)   Pulse 74   Resp 20   Ht 5\' 6"  (1.676 m)   Wt 189 lb (85.7 kg)   SpO2 95% Comment: RA  BMI 30.80 kg/m  81 year old man in no acute distress Alert and oriented x3 with no focal deficits Lungs clear bilaterally Cardiac regular  rate and rhythm  Diagnostic Tests: CT CHEST WITH CONTRAST   TECHNIQUE: Multidetector CT imaging of the chest was performed during intravenous contrast administration.   RADIATION DOSE REDUCTION: This exam was performed according to the departmental dose-optimization program which includes automated exposure control, adjustment of the mA and/or kV according to patient size and/or use of iterative reconstruction technique.   CONTRAST:  58mL OMNIPAQUE IOHEXOL 300 MG/ML  SOLN   COMPARISON:  PET-CT, 02/04/2022, CT chest, 01/21/2022   FINDINGS: Cardiovascular: Aortic atherosclerosis. Normal heart size. Left and right coronary artery calcifications. No pericardial effusion.   Mediastinum/Nodes: No enlarged mediastinal, hilar, or axillary lymph nodes. Redemonstrated hypodense nodule of the inferior pole of the right lobe of the thyroid  measuring 2.5 x 1.5 cm (series 2, image 17). Trachea, and esophagus demonstrate no significant findings.   Lungs/Pleura: Unchanged postoperative appearance of the chest, with bilateral upper lobe wedge resections and volume loss. No significant change in a nodule of the superior segment left lower lobe measuring 0.8 x 0.6 cm (series 5, image 28). Mild centrilobular emphysema and diffuse bilateral bronchial wall thickening. Mild scarring and or atelectasis of the bilateral lung bases. No pleural effusion or pneumothorax.   Upper Abdomen: No acute abnormality. Multiple faintly hyperenhancing foci throughout the liver are unchanged in comparison to multiple prior examinations dating back to at least 2018 and again presumably benign focal nodular hyperplasias, hemangiomata, or perfusion variations.   Musculoskeletal: No chest wall abnormality. No acute osseous findings.   IMPRESSION: 1. Unchanged postoperative appearance of the chest, with bilateral upper lobe wedge resections. 2. No significant change in a nodule of the superior segment left lower lobe measuring 0.8 cm. As previously reported, this has very slowly enlarged on examinations dating back to at least 2018, and was previously with suspected low level FDG avidity and remains modestly suspicious for indolent malignancy. Continued attention on follow-up. 3. Redemonstrated hypodense nodule of the inferior pole of the right lobe of the thyroid measuring 2.5 x 1.5 cm. As previously reported this nodule demonstrated FDG avidity on prior PET-CT. Recommend thyroid ultrasound and biopsy if and when clinically appropriate (ref: J Am Coll Radiol. 2015 Feb;12(2): 143-50). 4. Emphysema and diffuse bilateral bronchial wall thickening. 5. Coronary artery disease.   Aortic Atherosclerosis (ICD10-I70.0) and Emphysema (ICD10-J43.9).     Electronically Signed   By: Delanna Ahmadi M.D.   On: 06/19/2022 21:29 I personally reviewed the CT  images.  No change in the left lower lobe lung nodule.  Impression: [name] is an 81 year old man with a history of cancer, tobacco abuse, COPD, hyperlipidemia, aortic and coronary atherosclerosis, sleep apnea, pneumonia, depression, thyroid nodule, and lung nodule.  He had a lingular sparing left upper lobectomy in 2018 for stage Ib squamous cell carcinoma.  He had a wedge resection of the right upper lobe squamous cell carcinoma in March 2021.   Status post lingular sparing left upper lobectomy in 2018 for stage Ib squamous cell carcinoma and wedge resection of a squamous cell carcinoma of the right upper lobe in 2021.  No evidence of recurrence with either of those lesions.  Left lower lobe nodule-7.5 to 8 mm nodule in the superior segment of the left lower lobe minimally changed dating back to 2021.  Slow-growing malignancy is a possibility.  Morphology not consistent with adenocarcinoma.  Potentially could be carcinoid tumor or other benign nodule.  Was not hypermetabolic on PET/CT.  Thyroid nodule-noted to be hypermetabolic on PET/CT.  As far as I can  tell this has not been worked up.  He is not aware of any thyroid ultrasound or biopsy.  I will go ahead and schedule those now.  Plan: Thyroid ultrasound and needle biopsy Return in 6 months after CT chest with Dr. Thad Ranger, MD [facility] [phone]

## 2022-06-29 ENCOUNTER — Ambulatory Visit: Payer: Self-pay

## 2022-06-29 NOTE — Patient Outreach (Signed)
  Care Coordination   Follow Up Visit Note   06/29/2022 Name: [name] MRN: [id number] DOB: 23-Dec-1940  [name] is a 81 y.o. year old male who sees Avva, Ravisankar, MD for primary care. I spoke with  [name] by phone today.  What matters to the patients health and wellness today?  Patient is concerned that he may need a follow up Colonoscopy. Patient is scheduled to have a thyroid biopsy to evaluate a thyroid nodule. He continues to have shortness of breath and will schedule a follow up with his Pulmonologist.     Goals Addressed               This Visit's Progress     Patient Stated     I have to have a thyroid Ultrasound (pt-stated)        Care Coordination Interventions: Evaluation of current treatment plan related to thyroid nodule  and patient's adherence to plan as established by provider Reviewed scheduled/upcoming provider appointment including: [facility] for thyroid US scheduled for 07/11/22 @2 :00 PM         I struggle with shortness of breath when I try to walk long distances (pt-stated)        Care Coordination Interventions: Evaluation of current treatment plan related to COPD and patient's adherence to plan as established by provider Determined patient received and reviewed the COPD Self-Management plan previously sent, he denies having questions Determined patient continues to experience increased shortness of breath with activity and walking, reports oxygen saturation drops to around 86% with ambulation Determined patient is currently only prescribed to use Oxygen at bedtime Reviewed and discussed recent follow up with Cardiothoracic surgeon, Dr. Roxan Hockey for evaluation of Stage I squamous cell carcinoma of left lung  Review of patient status, including review of consultant's reports, relevant laboratory and other test results, and medications completed Determined patient does not have his 1 year follow up scheduled with Pulmonologist,  Dr. Valeta Harms Discussed patient will call today to schedule this follow up and will report his low Oxygen sats and shortness of breath to Dr. Valeta Harms Mailed printed educational materials related to deep breathing exercises          I would like to have a follow up Colonoscopy (pt-stated)        Care Coordination Interventions: Evaluation of current treatment plan related to follow up Colonoscopy and patient's adherence to plan as established by provider Determined patient has a family history of Colon cancer, his brother passed away from this type of Cancer Discussed patient had precancerous polyps in the past and was advised by previous GI doctor he should have a follow up Colonoscopy Reviewed with patient the CDC screening guidelines for Colorectal cancer  Encouraged patient to call [facility], his established GI doctor to schedule a follow up appointment in order to be further evaluated and advised regarding the Colonoscopy           SDOH assessments and interventions completed:  No     Care Coordination Interventions Activated:  Yes  Care Coordination Interventions:  Yes, provided   Follow up plan: Follow up call scheduled for 07/27/22 @12 :30 PM    Encounter Outcome:  Pt. Visit Completed

## 2022-06-29 NOTE — Patient Instructions (Signed)
Visit Information  Thank you for taking time to visit with me today. Please don't hesitate to contact me if I can be of assistance to you.   Following are the goals we discussed today:   Goals Addressed               This Visit's Progress     Patient Stated     I have to have a thyroid Ultrasound (pt-stated)        Care Coordination Interventions: Evaluation of current treatment plan related to thyroid nodule  and patient's adherence to plan as established by provider Reviewed scheduled/upcoming provider appointment including: [facility] for thyroid US scheduled for 07/11/22 @2 :00 PM         I struggle with shortness of breath when I try to walk long distances (pt-stated)        Care Coordination Interventions: Evaluation of current treatment plan related to COPD and patient's adherence to plan as established by provider Determined patient received and reviewed the COPD Self-Management plan previously sent, he denies having questions Determined patient continues to experience increased shortness of breath with activity and walking, reports oxygen saturation drops to around 86% with ambulation Determined patient is currently only prescribed to use Oxygen at bedtime Reviewed and discussed recent follow up with Cardiothoracic surgeon, Dr. Roxan Hockey for evaluation of Stage I squamous cell carcinoma of left lung  Review of patient status, including review of consultant's reports, relevant laboratory and other test results, and medications completed Determined patient does not have his 1 year follow up scheduled with Pulmonologist, Dr. Valeta Harms Discussed patient will call today to schedule this follow up and will report his low Oxygen sats and shortness of breath to Dr. Valeta Harms Mailed printed educational materials related to deep breathing exercises          I would like to have a follow up Colonoscopy (pt-stated)        Care Coordination Interventions: Evaluation of current  treatment plan related to follow up Colonoscopy and patient's adherence to plan as established by provider Determined patient has a family history of Colon cancer, his brother passed away from this type of Cancer Discussed patient had precancerous polyps in the past and was advised by previous GI doctor he should have a follow up Colonoscopy Reviewed with patient the CDC screening guidelines for Colorectal cancer  Encouraged patient to call  GI, his established GI doctor to schedule a follow up appointment in order to be further evaluated and advised regarding the Colonoscopy           Our next appointment is by telephone on 07/27/22 at 12:30 PM  Please call the care guide team at [phone] if you need to cancel or reschedule your appointment.   If you are experiencing a Mental Health or [place] or need someone to talk to, please call 1-800-273-TALK (toll free, 24 hour hotline)  The patient verbalized understanding of instructions, educational materials, and care plan provided today and agreed to receive a mailed copy of patient instructions, educational materials, and care plan.   Barb Merino, RN, BSN, CCM Care Management Coordinator [facility] Care Management  Direct Phone: [phone]

## 2022-07-11 ENCOUNTER — Ambulatory Visit
Admission: RE | Admit: 2022-07-11 | Discharge: 2022-07-11 | Disposition: A | Discharge status: Home / Self Care | Payer: Medicare Other | Source: Ambulatory Visit | Attending: Thoracic Surgery (Cardiothoracic Vascular Surgery) | Admitting: Thoracic Surgery (Cardiothoracic Vascular Surgery)

## 2022-07-11 DIAGNOSIS — E041 Nontoxic single thyroid nodule: Secondary | ICD-10-CM

## 2022-07-11 DIAGNOSIS — E042 Nontoxic multinodular goiter: Secondary | ICD-10-CM | POA: Diagnosis not present

## 2022-07-20 ENCOUNTER — Other Ambulatory Visit: Payer: Self-pay | Admitting: Thoracic Surgery (Cardiothoracic Vascular Surgery)

## 2022-07-27 ENCOUNTER — Ambulatory Visit: Payer: Self-pay

## 2022-07-27 NOTE — Patient Instructions (Signed)
Visit Information  Thank you for taking time to visit with me today. Please don't hesitate to contact me if I can be of assistance to you.   Following are the goals we discussed today:   Goals Addressed               This Visit's Progress     Patient Stated     I have to have a thyroid Ultrasound (pt-stated)        Care Coordination Interventions: Evaluation of current treatment plan related to thyroid nodule  and patient's adherence to plan as established by provider Determined patient completed his thyroid US as directed Reviewed scheduled/upcoming provider appointment for recommended thyroid biopsy scheduled for 08/04/22 @0845  AM at [place] Educated patient regarding what to expect with biopsy  Encouraged patient while providing rationale about asking Dr. Dagmar Hait at next scheduled visit about checking his thyroid function via blood sample         I struggle with shortness of breath when I try to walk long distances (pt-stated)        Care Coordination Interventions: Evaluation of current treatment plan related to COPD and patient's adherence to plan as established by provider Determined patient's oxygen levels have improved since he started using deep breathing exercises Instructed patient to continue to use the deep breathing exercises to help expand his lungs and move trapped air Instructed patient to report new symptoms or concerns to his doctor         Our next appointment is by telephone on 09/27/22 at 1030 AM  Please call the care guide team at [phone] if you need to cancel or reschedule your appointment.   If you are experiencing a Mental Health or [place] or need someone to talk to, please call 1-800-273-TALK (toll free, 24 hour hotline)  Patient verbalizes understanding of instructions and care plan provided today and agrees to view in [place]. Active MyChart status and patient understanding of how to access instructions and care plan  via MyChart confirmed with patient.     Barb Merino, RN, BSN, CCM Care Management Coordinator [facility] Care Management Direct Phone: [phone]

## 2022-07-27 NOTE — Patient Outreach (Signed)
  Care Coordination   Follow Up Visit Note   07/27/2022 Name: [name] MRN: [id number] DOB: 1941/05/09  [name] is a 81 y.o. year old male who sees Avva, Ravisankar, MD for primary care. I spoke with  [name] by phone today.  What matters to the patients health and wellness today?  Patient will continue to use deep breathing exercises to help with COPD. He is scheduled for a thyroid biopsy next week.     Goals Addressed               This Visit's Progress     Patient Stated     I have to have a thyroid Ultrasound (pt-stated)        Care Coordination Interventions: Evaluation of current treatment plan related to thyroid nodule  and patient's adherence to plan as established by provider Determined patient completed his thyroid US as directed Reviewed scheduled/upcoming provider appointment for recommended thyroid biopsy scheduled for 08/04/22 @0845  AM at [facility] patient regarding what to expect with biopsy  Encouraged patient while providing rationale about asking Dr. Dagmar Hait at next scheduled visit about checking his thyroid function via blood sample         I struggle with shortness of breath when I try to walk long distances (pt-stated)        Care Coordination Interventions: Evaluation of current treatment plan related to COPD and patient's adherence to plan as established by provider Determined patient's oxygen levels have improved since he started using deep breathing exercises Instructed patient to continue to use the deep breathing exercises to help expand his lungs and move trapped air Instructed patient to report new symptoms or concerns to his doctor         SDOH assessments and interventions completed:  No     Care Coordination Interventions:  Yes, provided   Follow up plan: Follow up call scheduled for 09/27/22 @1030  AM    Encounter Outcome:  Pt. Visit Completed

## 2022-08-04 ENCOUNTER — Ambulatory Visit
Admission: RE | Admit: 2022-08-04 | Discharge: 2022-08-04 | Disposition: A | Discharge status: Home / Self Care | Payer: Medicare Other | Source: Ambulatory Visit | Attending: Thoracic Surgery (Cardiothoracic Vascular Surgery) | Admitting: Thoracic Surgery (Cardiothoracic Vascular Surgery)

## 2022-08-04 ENCOUNTER — Other Ambulatory Visit (HOSPITAL_COMMUNITY)
Admission: RE | Admit: 2022-08-04 | Discharge: 2022-08-04 | Disposition: A | Discharge status: Home / Self Care | Payer: Medicare Other | Source: Ambulatory Visit | Attending: Thoracic Surgery (Cardiothoracic Vascular Surgery) | Admitting: Thoracic Surgery (Cardiothoracic Vascular Surgery)

## 2022-08-04 DIAGNOSIS — E041 Nontoxic single thyroid nodule: Secondary | ICD-10-CM

## 2022-08-04 DIAGNOSIS — E0789 Other specified disorders of thyroid: Secondary | ICD-10-CM | POA: Diagnosis not present

## 2022-08-09 LAB — CYTOLOGY - NON PAP

## 2022-08-17 ENCOUNTER — Telehealth: Payer: Self-pay | Admitting: Thoracic Surgery (Cardiothoracic Vascular Surgery)

## 2022-08-17 NOTE — Telephone Encounter (Signed)
    I returned [name] call re: thyroid biopsy results.  FNA showed atypical cells.  Recommended referral to thyroid specialist for further evaluation and he is agreeable  Will refer to Dr. Armandina Gemma at [place]. Roxan Hockey, MD [facility] [phone]

## 2022-08-24 ENCOUNTER — Encounter (HOSPITAL_COMMUNITY): Payer: Self-pay

## 2022-09-16 DIAGNOSIS — I10 Essential (primary) hypertension: Secondary | ICD-10-CM | POA: Diagnosis not present

## 2022-09-16 DIAGNOSIS — Z1152 Encounter for screening for COVID-19: Secondary | ICD-10-CM | POA: Diagnosis not present

## 2022-09-16 DIAGNOSIS — R0981 Nasal congestion: Secondary | ICD-10-CM | POA: Diagnosis not present

## 2022-09-16 DIAGNOSIS — C3490 Malignant neoplasm of unspecified part of unspecified bronchus or lung: Secondary | ICD-10-CM | POA: Diagnosis not present

## 2022-09-16 DIAGNOSIS — R490 Dysphonia: Secondary | ICD-10-CM | POA: Diagnosis not present

## 2022-09-16 DIAGNOSIS — J029 Acute pharyngitis, unspecified: Secondary | ICD-10-CM | POA: Diagnosis not present

## 2022-09-16 DIAGNOSIS — R001 Bradycardia, unspecified: Secondary | ICD-10-CM | POA: Diagnosis not present

## 2022-09-16 DIAGNOSIS — H6122 Impacted cerumen, left ear: Secondary | ICD-10-CM | POA: Diagnosis not present

## 2022-09-16 DIAGNOSIS — R051 Acute cough: Secondary | ICD-10-CM | POA: Diagnosis not present

## 2022-09-16 DIAGNOSIS — J441 Chronic obstructive pulmonary disease with (acute) exacerbation: Secondary | ICD-10-CM | POA: Diagnosis not present

## 2022-09-16 DIAGNOSIS — U071 COVID-19: Secondary | ICD-10-CM | POA: Diagnosis not present

## 2022-09-27 ENCOUNTER — Ambulatory Visit: Payer: Self-pay

## 2022-09-27 NOTE — Patient Outreach (Signed)
  Care Coordination   09/27/2022 Name: [name] MRN: [id number] DOB: Nov 01, 1940   Care Coordination Outreach Attempts:  An unsuccessful telephone outreach was attempted for a scheduled appointment today.  Follow Up Plan:  Additional outreach attempts will be made to offer the patient care coordination information and services.   Encounter Outcome:  Pt. Request to Call Back   Care Coordination Interventions:  No, not indicated    Barb Merino, RN, BSN, CCM Care Management Coordinator [place] [facility]  Direct Phone: [phone]

## 2022-10-04 NOTE — Progress Notes (Signed)
Rescheduled 10/18/22  [place]  Direct Dial: [phone]

## 2022-10-06 DIAGNOSIS — I10 Essential (primary) hypertension: Secondary | ICD-10-CM | POA: Diagnosis not present

## 2022-10-06 DIAGNOSIS — N390 Urinary tract infection, site not specified: Secondary | ICD-10-CM | POA: Diagnosis not present

## 2022-10-06 DIAGNOSIS — Z8616 Personal history of COVID-19: Secondary | ICD-10-CM | POA: Diagnosis not present

## 2022-10-06 DIAGNOSIS — C3490 Malignant neoplasm of unspecified part of unspecified bronchus or lung: Secondary | ICD-10-CM | POA: Diagnosis not present

## 2022-10-06 DIAGNOSIS — R300 Dysuria: Secondary | ICD-10-CM | POA: Diagnosis not present

## 2022-10-06 DIAGNOSIS — E042 Nontoxic multinodular goiter: Secondary | ICD-10-CM | POA: Diagnosis not present

## 2022-10-06 DIAGNOSIS — R319 Hematuria, unspecified: Secondary | ICD-10-CM | POA: Diagnosis not present

## 2022-10-06 DIAGNOSIS — D440 Neoplasm of uncertain behavior of thyroid gland: Secondary | ICD-10-CM | POA: Diagnosis not present

## 2022-10-06 DIAGNOSIS — F17200 Nicotine dependence, unspecified, uncomplicated: Secondary | ICD-10-CM | POA: Diagnosis not present

## 2022-10-18 ENCOUNTER — Ambulatory Visit: Payer: Self-pay

## 2022-10-18 NOTE — Patient Instructions (Signed)
Visit Information  Thank you for taking time to visit with me today. Please don't hesitate to contact me if I can be of assistance to you.   Following are the goals we discussed today:   Goals Addressed               This Visit's Progress     Patient Stated     COMPLETED: I have to have a thyroid Ultrasound (pt-stated)        Care Coordination Interventions: Evaluation of current treatment plan related to thyroid nodule  and patient's adherence to plan as established by provider Review of patient status, including review of consultant's reports, relevant laboratory and other test results, and medications completed Determined patient verbalizes understanding of his MD recommendations for annual surveillance  Instructed patient to notify his doctor of new symptoms or concerns        Other     Impaired Urinary Elimination        Care Coordination Interventions: Evaluation of current treatment plan related to Impaired Urinary Elimination and patient's adherence to plan as established by provider Determined patient just completed an Rx of Ciprofloxacin for treatment of a UTI that presented with hematuria  Determined patient passed 3-4 small stones, he will notify his PCP and has collected the stones to take into the office for evaluation  Educated patient on signs/symptoms suggestive of UTI, urged patient with rationale to notify his PCP promptly if symptoms occur for early treatment Educated patient on the importance of increasing his daily water intake as directed, aim for 48-64 oz daily  Reviewed scheduled/upcoming provider appointments including: next PCP follow up appointment with Dr. Dagmar Hait scheduled for 11/21/22 '@9'$ :45 AM          Our next appointment is by telephone on 11/23/22 at 1:00 PM  Please call the care guide team at [phone] if you need to cancel or reschedule your appointment.   If you are experiencing a Mental Health or [place] or need someone  to talk to, please call 1-800-273-TALK (toll free, 24 hour hotline) go to [facility] [address], [place] [phone])  Patient verbalizes understanding of instructions and care plan provided today and agrees to view in [place]. Active MyChart status and patient understanding of how to access instructions and care plan via MyChart confirmed with patient.     Barb Merino, RN, BSN, CCM Care Management Coordinator [facility] Care Management Direct Phone: [phone]

## 2022-10-18 NOTE — Patient Outreach (Addendum)
  Care Coordination   Follow Up Visit Note   10/18/2022 Name: [name] MRN: [id number] DOB: 07-30-1941  [name] is a 82 y.o. year old male who sees Avva, Ravisankar, MD for primary care. I spoke with  [name] by phone today.  What matters to the patients health and wellness today?  Patient will continue to drink recommended water intake daily. He will report any/all symptoms suggestive of UTI promptly.     Goals Addressed               This Visit's Progress     Patient Stated     COMPLETED: I have to have a thyroid Ultrasound (pt-stated)        Care Coordination Interventions: Evaluation of current treatment plan related to thyroid nodule  and patient's adherence to plan as established by provider Review of patient status, including review of consultant's reports, relevant laboratory and other test results, and medications completed Determined patient verbalizes understanding of his MD recommendations for annual surveillance  Instructed patient to notify his doctor of new symptoms or concerns        Other     Impaired Urinary Elimination        Care Coordination Interventions: Evaluation of current treatment plan related to Impaired Urinary Elimination and patient's adherence to plan as established by provider Determined patient just completed an Rx of Ciprofloxacin for treatment of a UTI that presented with hematuria  Determined patient passed 3-4 small stones, he will notify his PCP and has collected the stones to take into the office for evaluation  Educated patient on signs/symptoms suggestive of UTI, urged patient with rationale to notify his PCP promptly if symptoms occur for early treatment Educated patient on the importance of increasing his daily water intake as directed, aim for 48-64 oz daily  Reviewed scheduled/upcoming provider appointments including: next PCP follow up appointment with Dr. Dagmar Hait scheduled for 11/21/22 '@9'$ :34 AM      Interventions  Today    Flowsheet Row Most Recent Value  Chronic Disease   Chronic disease during today's visit Other  [Impaired Urinary Elimination,  Thyroid nodules]  General Interventions   General Interventions Discussed/Reviewed General Interventions Discussed, General Interventions Reviewed, Doctor Visits  Doctor Visits Discussed/Reviewed Specialist, PCP, Doctor Visits Reviewed, Doctor Visits Discussed  Nutrition Interventions   Nutrition Discussed/Reviewed Fluid intake          SDOH assessments and interventions completed:  No     Care Coordination Interventions:  Yes, provided   Follow up plan: Follow up call scheduled for 11/23/22 '@1'$ :00 PM    Encounter Outcome:  Pt. Visit Completed

## 2022-11-21 DIAGNOSIS — E785 Hyperlipidemia, unspecified: Secondary | ICD-10-CM | POA: Diagnosis not present

## 2022-11-21 DIAGNOSIS — R7989 Other specified abnormal findings of blood chemistry: Secondary | ICD-10-CM | POA: Diagnosis not present

## 2022-11-21 DIAGNOSIS — K219 Gastro-esophageal reflux disease without esophagitis: Secondary | ICD-10-CM | POA: Diagnosis not present

## 2022-11-21 DIAGNOSIS — E0590 Thyrotoxicosis, unspecified without thyrotoxic crisis or storm: Secondary | ICD-10-CM | POA: Diagnosis not present

## 2022-11-21 DIAGNOSIS — I10 Essential (primary) hypertension: Secondary | ICD-10-CM | POA: Diagnosis not present

## 2022-11-21 DIAGNOSIS — Z125 Encounter for screening for malignant neoplasm of prostate: Secondary | ICD-10-CM | POA: Diagnosis not present

## 2022-11-21 DIAGNOSIS — E042 Nontoxic multinodular goiter: Secondary | ICD-10-CM | POA: Diagnosis not present

## 2022-11-23 ENCOUNTER — Ambulatory Visit: Payer: Self-pay

## 2022-11-23 NOTE — Patient Outreach (Signed)
  Care Coordination   11/23/2022 Name: [name] MRN: [id number] DOB: 04-Sep-1940   Care Coordination Outreach Attempts:  An unsuccessful telephone outreach was attempted for a scheduled appointment today.  Follow Up Plan:  Additional outreach attempts will be made to offer the patient care coordination information and services.   Encounter Outcome:  No Answer   Care Coordination Interventions:  No, not indicated    Delsa Sale, RN, BSN, CCM Care Management Coordinator [facility] Care Management  Direct Phone: [phone]

## 2022-11-24 ENCOUNTER — Ambulatory Visit: Payer: Self-pay

## 2022-11-24 NOTE — Patient Instructions (Signed)
Visit Information  Thank you for taking time to visit with me today. Please don't hesitate to contact me if I can be of assistance to you.   Following are the goals we discussed today:   Goals Addressed             This Visit's Progress    COMPLETED: Impaired Urinary Elimination       Care Coordination Interventions: Evaluation of current treatment plan related to Impaired Urinary Elimination and patient's adherence to plan as established by provider Determined patient completed his prescribed antibiotic, he denies having further symptoms suggestive of UTI Reviewed and discussed upcoming PCP follow up scheduled with Dr. Felipa Eth set for 11/28/22 Educated patient about the importance of follow up to ensure the UTI has completely cleared Educated patient about the importance of reporting reoccurring symptoms promptly for early treatment         Our next appointment is by telephone on 01/05/23 at 11:00 AM  Please call the care guide team at [phone] if you need to cancel or reschedule your appointment.   If you are experiencing a Mental Health or Behavioral Health Crisis or need someone to talk to, please call 1-800-273-TALK (toll free, 24 hour hotline) go to [facility] [address] [place] [phone])  Patient verbalizes understanding of instructions and care plan provided today and agrees to view in MyChart. Active MyChart status and patient understanding of how to access instructions and care plan via MyChart confirmed with patient.     Delsa Sale, RN, BSN, CCM Care Management Coordinator [facility] Care Management Direct Phone: [phone]

## 2022-11-24 NOTE — Patient Outreach (Signed)
  Care Coordination   Follow Up Visit Note   11/24/2022 Name: TIMATHY MIXELL MRN: [id number] DOB: 06-06-1941  [name] is a 82 y.o. year old male who sees Avva, Ravisankar, MD for primary care. I spoke with  [name] by phone today.  What matters to the patients health and wellness today?  Patient will keep his PCP follow up as scheduled. He will ask his doctor about rechecking his urine.     Goals Addressed             This Visit's Progress    COMPLETED: Impaired Urinary Elimination       Care Coordination Interventions: Evaluation of current treatment plan related to Impaired Urinary Elimination and patient's adherence to plan as established by provider Determined patient completed his prescribed antibiotic, he denies having further symptoms suggestive of UTI Reviewed and discussed upcoming PCP follow up scheduled with Dr. Felipa Eth set for 11/28/22 Educated patient about the importance of follow up to ensure the UTI has completely cleared Educated patient about the importance of reporting reoccurring symptoms promptly for early treatment     Interventions Today    Flowsheet Row Most Recent Value  Chronic Disease   Chronic disease during today's visit Other  [UTI]  General Interventions   General Interventions Discussed/Reviewed General Interventions Discussed, General Interventions Reviewed, Labs, Doctor Visits  Doctor Visits Discussed/Reviewed Doctor Visits Discussed, Doctor Visits Reviewed, PCP  Education Interventions   Education Provided Provided Education  Provided Verbal Education On When to see the doctor, Labs, Medication  Nutrition Interventions   Nutrition Discussed/Reviewed Fluid intake          SDOH assessments and interventions completed:  No     Care Coordination Interventions:  Yes, provided   Follow up plan: Follow up call scheduled for 01/05/23 @11 :00 AM    Encounter Outcome:  Pt. Visit Completed

## 2022-11-28 DIAGNOSIS — Z1339 Encounter for screening examination for other mental health and behavioral disorders: Secondary | ICD-10-CM | POA: Diagnosis not present

## 2022-11-28 DIAGNOSIS — F329 Major depressive disorder, single episode, unspecified: Secondary | ICD-10-CM | POA: Diagnosis not present

## 2022-11-28 DIAGNOSIS — Z1331 Encounter for screening for depression: Secondary | ICD-10-CM | POA: Diagnosis not present

## 2022-11-28 DIAGNOSIS — R399 Unspecified symptoms and signs involving the genitourinary system: Secondary | ICD-10-CM | POA: Diagnosis not present

## 2022-11-28 DIAGNOSIS — E785 Hyperlipidemia, unspecified: Secondary | ICD-10-CM | POA: Diagnosis not present

## 2022-11-28 DIAGNOSIS — R82998 Other abnormal findings in urine: Secondary | ICD-10-CM | POA: Diagnosis not present

## 2022-11-28 DIAGNOSIS — C3490 Malignant neoplasm of unspecified part of unspecified bronchus or lung: Secondary | ICD-10-CM | POA: Diagnosis not present

## 2022-11-28 DIAGNOSIS — E669 Obesity, unspecified: Secondary | ICD-10-CM | POA: Diagnosis not present

## 2022-11-28 DIAGNOSIS — J441 Chronic obstructive pulmonary disease with (acute) exacerbation: Secondary | ICD-10-CM | POA: Diagnosis not present

## 2022-11-28 DIAGNOSIS — G4733 Obstructive sleep apnea (adult) (pediatric): Secondary | ICD-10-CM | POA: Diagnosis not present

## 2022-11-28 DIAGNOSIS — I10 Essential (primary) hypertension: Secondary | ICD-10-CM | POA: Diagnosis not present

## 2022-11-28 DIAGNOSIS — R200 Anesthesia of skin: Secondary | ICD-10-CM | POA: Diagnosis not present

## 2022-11-28 DIAGNOSIS — Z0000 Encounter for general adult medical examination without abnormal findings: Secondary | ICD-10-CM | POA: Diagnosis not present

## 2022-11-29 ENCOUNTER — Telehealth: Payer: Self-pay | Admitting: Medical Oncology

## 2022-11-29 NOTE — Telephone Encounter (Signed)
Faxed outside creatinine to [facility] for CT on 05/06.

## 2022-12-02 ENCOUNTER — Telehealth: Payer: Self-pay | Admitting: Physician Assistant

## 2022-12-02 NOTE — Telephone Encounter (Signed)
Rescheduled 05/09 appointment to 05/13 per provider pal, patient has been called and notified.

## 2022-12-19 ENCOUNTER — Other Ambulatory Visit: Payer: Self-pay

## 2022-12-19 ENCOUNTER — Inpatient Hospital Stay: Payer: Medicare Other | Attending: Internal Medicine

## 2022-12-19 ENCOUNTER — Ambulatory Visit (HOSPITAL_COMMUNITY)
Admission: RE | Admit: 2022-12-19 | Discharge: 2022-12-19 | Disposition: A | Discharge status: Home / Self Care | Payer: Medicare Other | Source: Ambulatory Visit | Attending: Internal Medicine | Admitting: Internal Medicine

## 2022-12-19 DIAGNOSIS — Z902 Acquired absence of lung [part of]: Secondary | ICD-10-CM | POA: Diagnosis not present

## 2022-12-19 DIAGNOSIS — C3412 Malignant neoplasm of upper lobe, left bronchus or lung: Secondary | ICD-10-CM | POA: Diagnosis not present

## 2022-12-19 DIAGNOSIS — R911 Solitary pulmonary nodule: Secondary | ICD-10-CM | POA: Insufficient documentation

## 2022-12-19 DIAGNOSIS — J984 Other disorders of lung: Secondary | ICD-10-CM | POA: Diagnosis not present

## 2022-12-19 DIAGNOSIS — J432 Centrilobular emphysema: Secondary | ICD-10-CM | POA: Diagnosis not present

## 2022-12-19 DIAGNOSIS — C3490 Malignant neoplasm of unspecified part of unspecified bronchus or lung: Secondary | ICD-10-CM

## 2022-12-19 LAB — CMP (CANCER CENTER ONLY)
ALT: 13 U/L (ref 0–44)
AST: 14 U/L — ABNORMAL LOW (ref 15–41)
Albumin: 4 g/dL (ref 3.5–5.0)
Alkaline Phosphatase: 73 U/L (ref 38–126)
Anion gap: 4 — ABNORMAL LOW (ref 5–15)
BUN: 12 mg/dL (ref 8–23)
CO2: 32 mmol/L (ref 22–32)
Calcium: 9.2 mg/dL (ref 8.9–10.3)
Chloride: 102 mmol/L (ref 98–111)
Creatinine: 0.69 mg/dL (ref 0.61–1.24)
GFR, Estimated: >60 mL/min (ref >60)
Glucose, Bld: 96 mg/dL (ref 70–99)
Potassium: 4.2 mmol/L (ref 3.5–5.1)
Sodium: 138 mmol/L (ref 135–145)
Total Bilirubin: 0.6 mg/dL (ref 0.3–1.2)
Total Protein: 6.9 g/dL (ref 6.5–8.1)

## 2022-12-19 LAB — CBC WITH DIFFERENTIAL (CANCER CENTER ONLY)
Abs Immature Granulocytes: 0.08 10*3/uL — ABNORMAL HIGH (ref 0.00–0.07)
Basophils Absolute: 0.1 10*3/uL (ref 0.0–0.1)
Basophils Relative: 1 %
Eosinophils Absolute: 0.2 10*3/uL (ref 0.0–0.5)
Eosinophils Relative: 2 %
HCT: 46.4 % (ref 39.0–52.0)
Hemoglobin: 16.2 g/dL (ref 13.0–17.0)
Immature Granulocytes: 1 %
Lymphocytes Relative: 9 %
Lymphs Abs: 0.9 10*3/uL (ref 0.7–4.0)
MCH: 31.8 pg (ref 26.0–34.0)
MCHC: 34.9 g/dL (ref 30.0–36.0)
MCV: 91.2 fL (ref 80.0–100.0)
Monocytes Absolute: 1.2 10*3/uL — ABNORMAL HIGH (ref 0.1–1.0)
Monocytes Relative: 12 %
Neutro Abs: 7.6 10*3/uL (ref 1.7–7.7)
Neutrophils Relative %: 75 %
Platelet Count: 276 10*3/uL (ref 150–400)
RBC: 5.09 MIL/uL (ref 4.22–5.81)
RDW: 13.8 % (ref 11.5–15.5)
WBC Count: 10.2 10*3/uL (ref 4.0–10.5)
nRBC: 0 % (ref 0.0–0.2)

## 2022-12-19 MED ORDER — IOHEXOL 300 MG/ML  SOLN
75.0000 mL | Freq: Once | INTRAMUSCULAR | Status: AC | PRN
  Administered 2022-12-19: 75 mL via INTRAVENOUS

## 2022-12-19 MED ORDER — SODIUM CHLORIDE (PF) 0.9 % IJ SOLN
INTRAMUSCULAR | Status: AC
  Filled 2022-12-19: qty 50

## 2022-12-19 NOTE — Progress Notes (Signed)
[facility] OFFICE PROGRESS NOTE  David Greathouse, MD [address] [place] [address]  DIAGNOSIS: Stage IB (T2a, N0, M0) non-small cell lung cancer, poorly differentiated invasive squamous cell carcinoma diagnosed in April 2018   PRIOR THERAPY: Status post left upper lobectomy with lymph node dissection under the care of Dr. Dorris Fetch on 12/12/2016 with tumor size of 3.2 cm.   CURRENT THERAPY: Observation   INTERVAL HISTORY: [name] 82 y.o. male returns to the clinic today for a follow-up visit. The patient was last seen by Dr. Arbutus Ped on 06/20/22. He has a history of stage IB lung cancer status post resection in 2018. He has been on observation since that time. He has a left lower lobe nodule that has been slowly enlarging. Dr. Arbutus Ped is watching this closely, but if this continues to enlarge, he may consider rebiopsy and possibly SBRT. Since last being seen, he denies any changes in his health. He did have a thyroid biopsy for a thyroid nodule that was benign a few months ago. The patient is feeling fine today with no concerning complaints except for the baseline fatigue and dyspnea on exertion from his emphysema. He admits he sometimes forgets to use his inhalers. He has no current chest pain, cough or hemoptysis. He has no nausea, vomiting, diarrhea or constipation. He has no headache or visual changes except he sometimes has a hard time reading close up. He is here today for evaluation and repeat CT scan of the chest for restaging of his disease.   MEDICAL HISTORY: Past Medical History:  Diagnosis Date   Anxiety    pt. reports that he " could swing from the ......" because he is so anxious about the surgery    Arthritis    R hand tendonitis    COPD (chronic obstructive pulmonary disease) (HCC)    Depression    Dyspnea    History of kidney stones    Hyperlipidemia    lung ca dx'd 10/2016   Pneumonia    Sleep apnea    cannot tolerate cpap    ALLERGIES:   is allergic to nsaids, gadolinium derivatives, and penicillins.  MEDICATIONS:  Current Outpatient Medications  Medication Sig Dispense Refill   acetaminophen (TYLENOL) 325 MG tablet Take 2 tablets (650 mg total) by mouth every 6 (six) hours as needed for mild pain (or Fever >/= 101).     albuterol (PROVENTIL) (2.5 MG/3ML) 0.083% nebulizer solution Take 3 mLs (2.5 mg total) by nebulization every 2 (two) hours as needed for wheezing. 75 mL 3   albuterol (VENTOLIN HFA) 108 (90 Base) MCG/ACT inhaler Inhale 2 puffs into the lungs every 6 (six) hours as needed for wheezing or shortness of breath. 8 g 6   Budeson-Glycopyrrol-Formoterol (BREZTRI AEROSPHERE) 160-9-4.8 MCG/ACT AERO Inhale 2 puffs into the lungs in the morning and at bedtime. 10.2 g 5   cilostazol (PLETAL) 50 MG tablet Take 1 tablet (50 mg total) by mouth 2 (two) times daily. 180 tablet 3   fexofenadine (ALLEGRA) 180 MG tablet Take 180 mg by mouth daily as needed for allergies or rhinitis.     ipratropium-albuterol (DUONEB) 0.5-2.5 (3) MG/3ML SOLN Take 3 mLs by nebulization every 6 (six) hours as needed.     sertraline (ZOLOFT) 100 MG tablet Take 100 mg by mouth daily.     simvastatin (ZOCOR) 20 MG tablet Take 20 mg by mouth every evening.     Spacer/Aero-Holding Chambers (AEROCHAMBER MV) inhaler Use as instructed 1 each  0   traZODone (DESYREL) 50 MG tablet Take 50 mg by mouth at bedtime.   1   No current facility-administered medications for this visit.    SURGICAL HISTORY:  Past Surgical History:  Procedure Laterality Date   COLONOSCOPY W/ POLYPECTOMY     cyst back  2011   CYSTOSCOPY  2012   for bleeding in bladder x 3   INTERCOSTAL NERVE BLOCK Right 10/28/2019   Procedure: Intercostal Nerve Block;  Surgeon: Loreli Slot, MD;  Location: [facility] OR;  Service: Thoracic;  Laterality: Right;   MOUTH SURGERY     NODE DISSECTION  10/28/2019   Procedure: Node Dissection;  Surgeon: Loreli Slot, MD;  Location: [facility] OR;   Service: Thoracic;;   VIDEO ASSISTED THORACOSCOPY (VATS)/ LOBECTOMY Left 12/12/2016   Procedure: VIDEO ASSISTED THORACOSCOPY (VATS)/LEFT UPPER LOBECTOMY;  Surgeon: Loreli Slot, MD;  Location: [facility] OR;  Service: Thoracic;  Laterality: Left;   VIDEO BRONCHOSCOPY Right 10/28/2019   VIDEO BRONCHOSCOPY WITH ENDOBRONCHIAL NAVIGATION (N/A )    VIDEO BRONCHOSCOPY WITH ENDOBRONCHIAL NAVIGATION N/A 10/28/2019   Procedure: VIDEO BRONCHOSCOPY WITH ENDOBRONCHIAL NAVIGATION;  Surgeon: Loreli Slot, MD;  Location: [facility] OR;  Service: Thoracic;  Laterality: N/A;    REVIEW OF SYSTEMS:   Review of Systems  Constitutional: Negative for appetite change, chills, fatigue, fever and unexpected weight change.  HENT: Negative for mouth sores, nosebleeds, sore throat and trouble swallowing.   Eyes: Negative for eye problems and icterus.  Respiratory: Positive for stable dyspnea on exertion. Negative for cough, hemoptysis, and wheezing.   Cardiovascular: Negative for chest pain and leg swelling.  Gastrointestinal: Negative for abdominal pain, constipation, diarrhea, nausea and vomiting.  Genitourinary: Negative for bladder incontinence, difficulty urinating, dysuria, frequency and hematuria.   Musculoskeletal: Negative for back pain, gait problem, neck pain and neck stiffness.  Skin: Negative for itching and rash.  Neurological: Negative for dizziness, extremity weakness, gait problem, headaches, light-headedness and seizures.  Hematological: Negative for adenopathy. Does not bruise/bleed easily.  Psychiatric/Behavioral: Negative for confusion, depression and sleep disturbance. The patient is not nervous/anxious.     PHYSICAL EXAMINATION:  Blood pressure (!) 143/69, pulse 74, temperature 97.7 F (36.5 C), temperature source Oral, resp. rate 18, weight 182 lb 4 oz (82.7 kg), SpO2 93 %.  ECOG PERFORMANCE STATUS: 0-1  Physical Exam  Constitutional: Oriented to person, place, and time and well-developed,  well-nourished, and in no distress.  HENT:  Head: Normocephalic and atraumatic.  Mouth/Throat: Oropharynx is clear and moist. No oropharyngeal exudate.  Eyes: Conjunctivae are normal. Right eye exhibits no discharge. Left eye exhibits no discharge. No scleral icterus.  Neck: Normal range of motion. Neck supple.  Cardiovascular: Normal rate, regular rhythm, normal heart sounds and intact distal pulses.   Pulmonary/Chest: Effort normal and breath sounds normal. No respiratory distress. No wheezes. No rales.  Abdominal: Soft. Bowel sounds are normal. Exhibits no distension and no mass. There is no tenderness.  Musculoskeletal: Normal range of motion. Exhibits no edema.  Lymphadenopathy:    No cervical adenopathy.  Neurological: Alert and oriented to person, place, and time. Exhibits normal muscle tone. Gait normal. Coordination normal.  Skin: Skin is warm and dry. No rash noted. Not diaphoretic. No erythema. No pallor.  Psychiatric: Mood, memory and judgment normal.  Vitals reviewed.  LABORATORY DATA: Lab Results  Component Value Date   WBC 10.2 12/19/2022   HGB 16.2 12/19/2022   HCT 46.4 12/19/2022   MCV 91.2 12/19/2022   PLT 276 12/19/2022  Chemistry      Component Value Date/Time   NA 138 12/19/2022 1323   NA 139 07/17/2017 1049   K 4.2 12/19/2022 1323   K 4.4 07/17/2017 1049   CL 102 12/19/2022 1323   CO2 32 12/19/2022 1323   CO2 29 07/17/2017 1049   BUN 12 12/19/2022 1323   BUN 14.8 07/17/2017 1049   CREATININE 0.69 12/19/2022 1323   CREATININE 0.8 07/17/2017 1049      Component Value Date/Time   CALCIUM 9.2 12/19/2022 1323   CALCIUM 9.3 07/17/2017 1049   ALKPHOS 73 12/19/2022 1323   ALKPHOS 81 07/17/2017 1049   AST 14 (L) 12/19/2022 1323   AST 16 07/17/2017 1049   ALT 13 12/19/2022 1323   ALT 22 07/17/2017 1049   BILITOT 0.6 12/19/2022 1323   BILITOT 0.55 07/17/2017 1049       RADIOGRAPHIC STUDIES:  CT Chest W Contrast  Result Date:  12/22/2022 CLINICAL DATA:  Non-small-cell lung cancer. Status post right upper lobe wedge resection. Restaging. * Tracking Code: BO * EXAM: CT CHEST WITH CONTRAST TECHNIQUE: Multidetector CT imaging of the chest was performed during intravenous contrast administration. RADIATION DOSE REDUCTION: This exam was performed according to the departmental dose-optimization program which includes automated exposure control, adjustment of the mA and/or kV according to patient size and/or use of iterative reconstruction technique. CONTRAST:  75mL OMNIPAQUE IOHEXOL 300 MG/ML  SOLN COMPARISON:  06/16/2022 FINDINGS: Cardiovascular: The heart size is normal. No substantial pericardial effusion. Coronary artery calcification is evident. Mild atherosclerotic calcification is noted in the wall of the thoracic aorta. Enlargement of the pulmonary outflow tract/main pulmonary arteries suggests pulmonary arterial hypertension. Mediastinum/Nodes: No mediastinal lymphadenopathy. Stable right thyroid nodule. This has been evaluated on previous imaging. (ref: J Am Coll Radiol. 2015 Feb;12(2): 143-50).There is no hilar lymphadenopathy. The esophagus has normal imaging features. There is no axillary lymphadenopathy. Lungs/Pleura: Centrilobular and paraseptal emphysema evident. Staple line with surgical scarring again noted posterior right upper lobe, stable. Surgical changes noted left hilum. Scattered areas of architectural distortion and scarring are noted bilaterally. 7 mm nodule superior segment left lower lobe is stable (image 41/5). No new suspicious pulmonary nodule or mass. No focal airspace consolidation. No pleural effusion. Upper Abdomen: 15 mm hypervascular lesion posterior right liver (144/2) is stable in the interval, likely benign. Additional tiny foci of hyperenhancement in the liver are also unchanged. Musculoskeletal: No worrisome lytic or sclerotic osseous abnormality. IMPRESSION: 1. Stable exam. No new or progressive  findings to suggest recurrent or metastatic disease. 2. Stable 7 mm left lower lobe pulmonary nodule. Continued attention on follow-up recommended. 3. Enlargement of the pulmonary outflow tract/main pulmonary arteries suggests pulmonary arterial hypertension. 4. Aortic Atherosclerosis (ICD10-I70.0) and Emphysema (ICD10-J43.9). Electronically Signed   By: Kennith Center M.D.   On: 12/22/2022 10:28     ASSESSMENT/PLAN:  This is a very pleasant 82 year old Caucasian male initially diagnosed with stage Ib non-small cell lung cancer poorly differentiated invasive squamous cell carcinoma.  He is status post a left upper lobectomy with lymph node dissection under the care of Dr. Dorris Fetch in April 2018.  The tumor was 3.2 cm without any evidence of visceral pleural or lymphovascular invasion.   The patient had been on observation and feeling well. He has a a nodule in the left lung that is slowly growing possibly slow growing indolent malignancy. Dr. Arbutus Ped is monitoring this closely. If the nodule continues to increase in size, then Dr. Arbutus Ped recommends rebiposy and possible SBRT.  The patient recently had a restaging CT scan. Dr. Arbutus Ped personally and independently reviewed the scan and discussed the results with the patient.   The scan showed no evidence of disease progression. The 7 mm nodule is stable.   Dr. Arbutus Ped recommends that he continue on observation with a repeat CT scan in 1 year.   We will see him back for a follow up visit a few days after his next CT scan.   The patient was advised to call immediately if she has any concerning symptoms in the interval. The patient voices understanding of current disease status and treatment options and is in agreement with the current care plan. All questions were answered. The patient knows to call the clinic with any problems, questions or concerns. We can certainly see the patient much sooner if necessary      Orders Placed This Encounter   Procedures   CT Chest W Contrast    Standing Status:   Future    Standing Expiration Date:   12/26/2023    Order Specific Question:   If indicated for the ordered procedure, I authorize the administration of contrast media per Radiology protocol    Answer:   Yes    Order Specific Question:   Does the patient have a contrast media/X-ray dye allergy?    Answer:   No    Order Specific Question:   Preferred imaging location?    Answer:   [facility]      David Perusse L Marquet Faircloth, PA-C 12/26/22  ADDENDUM:: Hematology/Oncology Attending: I had a face-to-face encounter with the patient today.  I reviewed his record, lab, scan and recommended his care plan.  This is a very pleasant 82 years old white male with history of stage Ib non-small cell lung cancer, squamous cell carcinoma diagnosed in April 2018 status post left upper lobectomy with lymph node dissection and has been in observation since that time.  The patient had repeat CT scan of the chest performed recently.  I personally and independently reviewed the scan and discussed results with the patient today. His scan showed no concerning findings for disease progression but he has a stable left lower lobe lung nodule that we have been monitoring closely and that showed no significant change over several imaging studies. I recommended for the patient to continue on observation with repeat CT scan of the chest in 1 year but he was also advised to call immediately if he has any concerning change or symptoms in the interval. Disclaimer: This note was dictated with voice recognition software. Similar sounding words can inadvertently be transcribed and may be missed upon review. Lajuana Matte, MD 12/26/22

## 2022-12-20 ENCOUNTER — Telehealth: Payer: Self-pay | Admitting: Internal Medicine

## 2022-12-20 NOTE — Telephone Encounter (Signed)
Called patient regarding upcoming May appointments, patient is notified.  ?

## 2022-12-21 ENCOUNTER — Ambulatory Visit: Payer: Medicare Other | Admitting: Internal Medicine

## 2022-12-22 ENCOUNTER — Ambulatory Visit: Payer: Medicare Other | Admitting: Internal Medicine

## 2022-12-26 ENCOUNTER — Inpatient Hospital Stay (HOSPITAL_BASED_OUTPATIENT_CLINIC_OR_DEPARTMENT_OTHER): Payer: Medicare Other | Admitting: Physician Assistant

## 2022-12-26 ENCOUNTER — Other Ambulatory Visit: Payer: Self-pay

## 2022-12-26 VITALS — BP 143/69 | HR 74 | Temp 97.7°F | Resp 18 | Wt 182.2 lb

## 2022-12-26 DIAGNOSIS — C3491 Malignant neoplasm of unspecified part of right bronchus or lung: Secondary | ICD-10-CM | POA: Diagnosis not present

## 2022-12-26 DIAGNOSIS — Z902 Acquired absence of lung [part of]: Secondary | ICD-10-CM | POA: Diagnosis not present

## 2022-12-26 DIAGNOSIS — R911 Solitary pulmonary nodule: Secondary | ICD-10-CM | POA: Diagnosis not present

## 2022-12-26 DIAGNOSIS — C3412 Malignant neoplasm of upper lobe, left bronchus or lung: Secondary | ICD-10-CM | POA: Diagnosis not present

## 2023-01-03 ENCOUNTER — Ambulatory Visit (INDEPENDENT_AMBULATORY_CARE_PROVIDER_SITE_OTHER): Payer: Medicare Other | Admitting: Thoracic Surgery (Cardiothoracic Vascular Surgery)

## 2023-01-03 VITALS — BP 137/74 | HR 85 | Resp 20 | Ht 66.0 in | Wt 182.0 lb

## 2023-01-03 DIAGNOSIS — C3492 Malignant neoplasm of unspecified part of left bronchus or lung: Secondary | ICD-10-CM | POA: Diagnosis not present

## 2023-01-03 DIAGNOSIS — Z902 Acquired absence of lung [part of]: Secondary | ICD-10-CM | POA: Diagnosis not present

## 2023-01-03 NOTE — Progress Notes (Signed)
[address]       [place] [address]             [phone]     HPI: [name] returns for a scheduled follow-up visit regarding a left lower lobe lung nodule.  [name] is an 82 year old man with a history of lung cancer, tobacco abuse, COPD, hyperlipidemia, aortic and coronary atherosclerosis, sleep apnea, pneumonia, depression, thyroid nodule, and lung nodule.  He had a lingular sparing left upper lobectomy in 2018 for a stage Ib squamous cell carcinoma.  He had a second squamous cell carcinoma that was resected with a wedge resection from the right upper lobe in March 2021.  He also has a well-circumscribed 8 mm solid nodule in the superior segment of the left lower lobe.  I last saw him in November.  He had a thyroid nodule that was noted to be hypermetabolic on PET/CT.  Thyroid biopsy was obtained which showed atypical cells.  He was referred to Dr. Gerrit Friends.  He recommended follow-up in 1 year.  He recently saw Dr. Arbutus Ped.  His lung nodule was reported to be unchanged.  He gets short of breath with exertion and that has gradually gotten worse over time.  Past Medical History:  Diagnosis Date   Anxiety    pt. reports that he " could swing from the ......" because he is so anxious about the surgery    Arthritis    R hand tendonitis    COPD (chronic obstructive pulmonary disease) (HCC)    Depression    Dyspnea    History of kidney stones    Hyperlipidemia    lung ca dx'd 10/2016   Pneumonia    Sleep apnea    cannot tolerate cpap    Current Outpatient Medications  Medication Sig Dispense Refill   acetaminophen (TYLENOL) 325 MG tablet Take 2 tablets (650 mg total) by mouth every 6 (six) hours as needed for mild pain (or Fever >/= 101).     albuterol (PROVENTIL) (2.5 MG/3ML) 0.083% nebulizer solution Take 3 mLs (2.5 mg total) by nebulization every 2 (two) hours as needed for wheezing. 75 mL 3   albuterol (VENTOLIN HFA) 108 (90 Base) MCG/ACT inhaler Inhale  2 puffs into the lungs every 6 (six) hours as needed for wheezing or shortness of breath. 8 g 6   Budeson-Glycopyrrol-Formoterol (BREZTRI AEROSPHERE) 160-9-4.8 MCG/ACT AERO Inhale 2 puffs into the lungs in the morning and at bedtime. 10.2 g 5   cilostazol (PLETAL) 50 MG tablet Take 1 tablet (50 mg total) by mouth 2 (two) times daily. 180 tablet 3   fexofenadine (ALLEGRA) 180 MG tablet Take 180 mg by mouth daily as needed for allergies or rhinitis.     ipratropium-albuterol (DUONEB) 0.5-2.5 (3) MG/3ML SOLN Take 3 mLs by nebulization every 6 (six) hours as needed.     simvastatin (ZOCOR) 20 MG tablet Take 20 mg by mouth every evening.     Spacer/Aero-Holding Chambers (AEROCHAMBER MV) inhaler Use as instructed 1 each 0   traZODone (DESYREL) 50 MG tablet Take 50 mg by mouth at bedtime.   1   sertraline (ZOLOFT) 100 MG tablet Take 100 mg by mouth daily.     No current facility-administered medications for this visit.    Physical Exam BP 137/74   Pulse 85   Resp 20   Ht 5\' 6"  (1.676 m)   Wt 182 lb (82.6 kg)   SpO2 93% Comment: RA  BMI 29.38  kg/m  82 year old man in no acute distress Alert and oriented x 3 with no focal deficits Lungs diminished breath sounds otherwise clear, no wheezing Cardiac regular rate and rhythm no murmur No peripheral edema  Diagnostic Tests: CT CHEST WITH CONTRAST   TECHNIQUE: Multidetector CT imaging of the chest was performed during intravenous contrast administration.   RADIATION DOSE REDUCTION: This exam was performed according to the departmental dose-optimization program which includes automated exposure control, adjustment of the mA and/or kV according to patient size and/or use of iterative reconstruction technique.   CONTRAST:  75mL OMNIPAQUE IOHEXOL 300 MG/ML  SOLN   COMPARISON:  06/16/2022   FINDINGS: Cardiovascular: The heart size is normal. No substantial pericardial effusion. Coronary artery calcification is evident. Mild atherosclerotic  calcification is noted in the wall of the thoracic aorta. Enlargement of the pulmonary outflow tract/main pulmonary arteries suggests pulmonary arterial hypertension.   Mediastinum/Nodes: No mediastinal lymphadenopathy. Stable right thyroid nodule. This has been evaluated on previous imaging. (ref: J Am Coll Radiol. 2015 Feb;12(2): 143-50).There is no hilar lymphadenopathy. The esophagus has normal imaging features. There is no axillary lymphadenopathy.   Lungs/Pleura: Centrilobular and paraseptal emphysema evident. Staple line with surgical scarring again noted posterior right upper lobe, stable. Surgical changes noted left hilum. Scattered areas of architectural distortion and scarring are noted bilaterally. 7 mm nodule superior segment left lower lobe is stable (image 41/5). No new suspicious pulmonary nodule or mass. No focal airspace consolidation. No pleural effusion.   Upper Abdomen: 15 mm hypervascular lesion posterior right liver (144/2) is stable in the interval, likely benign. Additional tiny foci of hyperenhancement in the liver are also unchanged.   Musculoskeletal: No worrisome lytic or sclerotic osseous abnormality.   IMPRESSION: 1. Stable exam. No new or progressive findings to suggest recurrent or metastatic disease. 2. Stable 7 mm left lower lobe pulmonary nodule. Continued attention on follow-up recommended. 3. Enlargement of the pulmonary outflow tract/main pulmonary arteries suggests pulmonary arterial hypertension. 4. Aortic Atherosclerosis (ICD10-I70.0) and Emphysema (ICD10-J43.9).     Electronically Signed   By: Kennith Center M.D.   On: 12/22/2022 10:28 I personally reviewed the CT images.  No change in the 7 mm nodule in the superior segment of the left lower lobe.  Aortic atherosclerosis.  Emphysema.  Some enlargement of pulmonary outflow tract/main pulmonary arteries unchanged going back at least 3 years.  Impression: [name] is an 82 year old  man with a history of lung cancer, tobacco abuse, COPD, hyperlipidemia, aortic and coronary atherosclerosis, sleep apnea, pneumonia, depression, thyroid nodules, and a lung nodule.  Left lower lobe lung nodule-stable at about 7 to 8 mm.  Minimal change dating back to 2021.  Very smoothly marginated.  May be a hamartoma.  In any event there is no indication for intervention at this time.  Enlargement of pulmonary artery-he had an echocardiogram but a year ago which showed no left or right ventricular dysfunction.  No signs or symptoms of right heart failure.  Dyspnea-likely COPD related.  In 2021 his FEV1 and DLCO were about 50% of predicted.  Likely less than that at this point.  Plan: I will plan to see him back in a year after he has a CT with Dr. Remi Deter, MD [facility] [phone]

## 2023-01-05 ENCOUNTER — Ambulatory Visit: Payer: Self-pay

## 2023-01-05 NOTE — Patient Instructions (Signed)
Visit Information  Thank you for taking time to visit with me today. Please don't hesitate to contact me if I can be of assistance to you.   Following are the goals we discussed today:   Goals Addressed               This Visit's Progress     Patient Stated     I struggle with shortness of breath when I try to walk long distances (pt-stated)        Care Coordination Interventions: Evaluation of current treatment plan related to COPD and patient's adherence to plan as established by provider Determined patient has completed recent follow up visits with his PCP, Oncologist, and Cardiology  Review of patient status, including review of consultant's reports, relevant laboratory and other test results, and medications completed Educated patient on types of exercises to consider to help with fatigue, increase stamina and endurance and preserve muscle mass; educated on PREP program  Placed outbound call to [place] with [employer] at [employer], discussed multiple programs available for seniors, discussed patient may schedule a tour or call to learn more about the [employer], patient advised and given the name/contact number for the [employer]        Our next appointment is by telephone on 03/07/23 at 12:30 PM  Please call the care guide team at [phone] if you need to cancel or reschedule your appointment.   If you are experiencing a Mental Health or Behavioral Health Crisis or need someone to talk to, please call 1-800-273-TALK (toll free, 24 hour hotline) go to [facility] [address], [place] [phone])  Patient verbalizes understanding of instructions and care plan provided today and agrees to view in MyChart. Active MyChart status and patient understanding of how to access instructions and care plan via MyChart confirmed with patient.     Delsa Sale, RN, BSN, CCM Care Management Coordinator [facility] Care Management   Direct Phone: [phone]

## 2023-01-05 NOTE — Patient Outreach (Signed)
  Care Coordination   Follow Up Visit Note   01/05/2023 Name: [name] MRN: [id number] DOB: 01-Jun-1941  [name] is a 82 y.o. year old male who sees Avva, Ravisankar, MD for primary care. I spoke with  [name] by phone today.  What matters to the patients health and wellness today?  Patient would like to establish a routine exercise regimen at [facility] at the [facility] location.    Goals Addressed               This Visit's Progress     Patient Stated     I struggle with shortness of breath when I try to walk long distances (pt-stated)        Care Coordination Interventions: Evaluation of current treatment plan related to COPD and patient's adherence to plan as established by provider Determined patient has completed recent follow up visits with his PCP, Oncologist, and Cardiology  Review of patient status, including review of consultant's reports, relevant laboratory and other test results, and medications completed Educated patient on types of exercises to consider to help with fatigue, increase stamina and endurance and preserve muscle mass; educated on PREP program  Placed outbound call to [place] with [employer] at [employer], discussed multiple programs available for seniors, discussed patient may schedule a tour or call to learn more about the [employer], patient advised and given the name/contact number for the [employer]    Interventions Today    Flowsheet Row Most Recent Value  Chronic Disease   Chronic disease during today's visit Other  [Non-small cell carcinoma of lung, stage 1, right]  General Interventions   General Interventions Discussed/Reviewed General Interventions Discussed, General Interventions Reviewed, Doctor Visits, Labs  Doctor Visits Discussed/Reviewed Doctor Visits Discussed, Doctor Visits Reviewed, Specialist, PCP  Exercise Interventions   Exercise Discussed/Reviewed Exercise Discussed,  Exercise Reviewed, Physical Activity  Physical Activity Discussed/Reviewed Physical Activity Discussed, Physical Activity Reviewed, Types of exercise, PREP, Gym  Education Interventions   Education Provided Provided Education  Provided Verbal Education On When to see the doctor, Exercise, Labs          SDOH assessments and interventions completed:  No     Care Coordination Interventions:  Yes, provided   Follow up plan: Follow up call scheduled for 03/07/23 @12 :30 PM    Encounter Outcome:  Pt. Visit Completed

## 2023-03-07 ENCOUNTER — Ambulatory Visit: Payer: Self-pay

## 2023-03-07 NOTE — Patient Outreach (Signed)
  Care Coordination   03/07/2023 Name: [name] MRN: [id number] DOB: 14-Feb-1941   Care Coordination Outreach Attempts:  An unsuccessful telephone outreach was attempted for a scheduled appointment today.  Follow Up Plan:  Additional outreach attempts will be made to offer the patient care coordination information and services.   Encounter Outcome:  No Answer   Care Coordination Interventions:  No, not indicated    Delsa Sale, RN, BSN, CCM Care Management Coordinator [facility] Care Management  Direct Phone: [phone]

## 2023-03-30 ENCOUNTER — Other Ambulatory Visit: Payer: Self-pay | Admitting: Pulmonary Disease

## 2023-04-04 ENCOUNTER — Encounter: Payer: Self-pay | Admitting: Adult Health

## 2023-04-04 ENCOUNTER — Ambulatory Visit: Payer: Self-pay

## 2023-04-04 ENCOUNTER — Ambulatory Visit (INDEPENDENT_AMBULATORY_CARE_PROVIDER_SITE_OTHER): Payer: Medicare Other | Admitting: Adult Health

## 2023-04-04 VITALS — BP 118/80 | HR 75 | Temp 97.4°F | Ht 66.0 in | Wt 176.4 lb

## 2023-04-04 DIAGNOSIS — G4734 Idiopathic sleep related nonobstructive alveolar hypoventilation: Secondary | ICD-10-CM | POA: Diagnosis not present

## 2023-04-04 DIAGNOSIS — C3492 Malignant neoplasm of unspecified part of left bronchus or lung: Secondary | ICD-10-CM

## 2023-04-04 DIAGNOSIS — J430 Unilateral pulmonary emphysema [MacLeod's syndrome]: Secondary | ICD-10-CM

## 2023-04-04 DIAGNOSIS — R911 Solitary pulmonary nodule: Secondary | ICD-10-CM | POA: Diagnosis not present

## 2023-04-04 HISTORY — DX: Idiopathic sleep related nonobstructive alveolar hypoventilation: G47.34

## 2023-04-04 NOTE — Assessment & Plan Note (Signed)
Left lower lobe lung nodule stable on serial CT chest.  Most recent CT chest on Dec 19, 2022 showed a stable 7 mm left lower lobe nodule.  Continue follow-up with oncology

## 2023-04-04 NOTE — Patient Instructions (Addendum)
Continue on Breztri 2 puffs Twice daily rinse after use.  Albuterol inhaler As needed   Activity as tolerated  Flu shot this fall.  Continue on Oxygen 3l/m At bedtime   CT chest as planned May 2025  Follow up with Icard in 1 year and As needed

## 2023-04-04 NOTE — Patient Instructions (Signed)
Visit Information  Thank you for taking time to visit with me today. Please don't hesitate to contact me if I can be of assistance to you.   Following are the goals we discussed today:   Goals Addressed               This Visit's Progress     Patient Stated     I struggle with shortness of breath when I try to walk long distances (pt-stated)        Care Coordination Interventions: Provided patient with basic written and verbal COPD education on self care/management/and exacerbation prevention Provided instruction about proper use of medications used for management of COPD including inhalers Advised patient to engage in light exercise as tolerated 3-5 days a week to aid in the the management of COPD Provided education about and advised patient to utilize infection prevention strategies to reduce risk of respiratory infection Discussed the importance of adequate rest and management of fatigue with COPD        Our next appointment is by telephone on 06/05/23 at 12:00 PM   Please call the care guide team at [phone] if you need to cancel or reschedule your appointment.   If you are experiencing a Mental Health or Behavioral Health Crisis or need someone to talk to, please call 1-800-273-TALK (toll free, 24 hour hotline)  Patient verbalizes understanding of instructions and care plan provided today and agrees to view in MyChart. Active MyChart status and patient understanding of how to access instructions and care plan via MyChart confirmed with patient.     Delsa Sale, RN, BSN, CCM Care Management Coordinator [facility] Care Management  Direct Phone: [phone]

## 2023-04-04 NOTE — Patient Outreach (Signed)
  Care Coordination   Follow Up Visit Note   04/04/2023 Name: [name] MRN: [id number] DOB: 05-Sep-1940  [name] is a 82 y.o. year old male who sees Avva, Ravisankar, MD for primary care. I spoke with  [name] by phone today.  What matters to the patients health and wellness today?  Patient would like to stay physically active without complications with his COPD.     Goals Addressed               This Visit's Progress     Patient Stated     I struggle with shortness of breath when I try to walk long distances (pt-stated)        Care Coordination Interventions: Provided patient with basic written and verbal COPD education on self care/management/and exacerbation prevention Provided instruction about proper use of medications used for management of COPD including inhalers Advised patient to engage in light exercise as tolerated 3-5 days a week to aid in the the management of COPD Provided education about and advised patient to utilize infection prevention strategies to reduce risk of respiratory infection Discussed the importance of adequate rest and management of fatigue with COPD    Interventions Today    Flowsheet Row Most Recent Value  Chronic Disease   Chronic disease during today's visit Chronic Obstructive Pulmonary Disease (COPD)  General Interventions   General Interventions Discussed/Reviewed General Interventions Discussed, General Interventions Reviewed, Doctor Visits  Doctor Visits Discussed/Reviewed Doctor Visits Reviewed, Doctor Visits Discussed, Specialist  Exercise Interventions   Exercise Discussed/Reviewed Physical Activity  Physical Activity Discussed/Reviewed Physical Activity Discussed, Physical Activity Reviewed, Types of exercise  Education Interventions   Education Provided Provided Education  Provided Verbal Education On When to see the doctor, Other  [infection prevention, vaccination recommendation]  Safety Interventions   Safety  Discussed/Reviewed Safety Reviewed, Safety Discussed          SDOH assessments and interventions completed:  No     Care Coordination Interventions:  Yes, provided   Follow up plan: Follow up call scheduled for 06/05/23 @12 :00 PM     Encounter Outcome:  Pt. Visit Completed

## 2023-04-04 NOTE — Progress Notes (Signed)
@Patient  ID: [name], male    DOB: [birth year], 82 y.o.   MRN: [id number]  Chief Complaint  Patient presents with   Follow-up    Referring provider: Chilton Greathouse, MD  HPI: 82 year old male former smoker followed for COPD and history of lung cancer with stage Ib non-small cell lung cancer in 2021 status post wedge resection and resection in 2018 and nocturnal hypoxemia on oxygen at bedtime  TEST/EVENTS :  CT chest June 16, 2022 stable bilateral upper lobe wedge resection postop changes stable, so significant change in the left lower lobe nodule measuring 0.8 cm, emphysema  CT chest Dec 19, 2022 emphysema.  Stable surgical changes right upper lobe and left hilum.  7 mm left lower lobe stable nodule  PFT October 24, 2019 FEV1 48%, ratio 49, FVC 68%, no significant bronchodilator response, mid flow reversibility, DLCO 53%.  04/04/2023 Follow up: COPD , O2 RF , Lung cancer hx  Patient presents for a follow-up visit.  Last seen 2022.  Patient has underlying severe COPD.  He remains on Breztri twice daily.  Has rare use of albuterol.  Says he gets short of breath with prolonged walking.  Tries to stay active at home.  Walks his dog for short walks.  Is able to do light household chores.  Patient lives at home with his wife.  He is able to drive.  He has been married more than 60 years. Patient has a history of lung cancer.  He is status post left upper lobectomy in 2018 for stage Ib squamous cell carcinoma.  Second squamous cell carcinoma status post resection of the right upper lobe in March 2021.  He has a known 8 mm nodule in the superior segment of the left lower lobe that is being monitored on serial CT chest by oncology.  Most recent CT chest was in May 2024.  And showed stable surgical changes in the right upper lobe and left hilum.  7 mm left lower lobe nodule was stable.  He has a planned CT chest in May 2025.  Patient remains on oxygen 3 L at bedtime.  We discussed We discussed  fall vaccines.  Patient plans on getting his flu shot with his primary care provider.  .     Allergies  Allergen Reactions   Nsaids Other (See Comments)    MD RESTRICTS USE OF ANY "BLOOD THINING" MEDS [ "DUE TO GROWTH IN BLADDER" ]    Gadolinium Derivatives Nausea And Vomiting and Other (See Comments)    CHILLS SHAKING FEELING COLD   Penicillins Other (See Comments)    "Feels like my skin is crawling"  Has patient had a PCN reaction causing immediate rash, facial/tongue/throat swelling, SOB or lightheadedness with hypotension: No Has patient had a PCN reaction causing severe rash involving mucus membranes or skin necrosis: No Has patient had a PCN reaction that required hospitalization No Has patient had a PCN reaction occurring within the last 10 years: No If all of the above answers are "NO", then may proceed with Cephalosporin use.     Immunization History  Administered Date(s) Administered   Influenza,inj,Quad PF,6+ Mos 05/03/2013   PFIZER(Purple Top)SARS-COV-2 Vaccination 09/05/2019, 09/26/2019   Pneumococcal Polysaccharide-23 07/05/2002, 11/05/2021   Td (Adult) 02/22/2010   Td (Adult),5 Lf Tetanus Toxid, Preservative Free 06/29/2001, 03/14/2013   Zoster, Live 03/26/2012    Past Medical History:  Diagnosis Date   Anxiety    pt. reports that he " could swing from the ......" because  he is so anxious about the surgery    Arthritis    R hand tendonitis    COPD (chronic obstructive pulmonary disease) (HCC)    Depression    Dyspnea    History of kidney stones    Hyperlipidemia    lung ca dx'd 10/2016   Nocturnal hypoxemia 04/04/2023   Pneumonia    Sleep apnea    cannot tolerate cpap    Tobacco History: Social History   Tobacco Use  Smoking Status Former   Current packs/day: 0.00   Average packs/day: 1.5 packs/day for 65.2 years (97.9 ttl pk-yrs)   Types: Cigarettes   Start date: 08/15/1950   Quit date: 11/14/2015   Years since quitting: 7.3  Smokeless  Tobacco Never   Counseling given: Not Answered   Outpatient Medications Prior to Visit  Medication Sig Dispense Refill   acetaminophen (TYLENOL) 325 MG tablet Take 2 tablets (650 mg total) by mouth every 6 (six) hours as needed for mild pain (or Fever >/= 101).     albuterol (PROVENTIL) (2.5 MG/3ML) 0.083% nebulizer solution Take 3 mLs (2.5 mg total) by nebulization every 2 (two) hours as needed for wheezing. 75 mL 3   albuterol (VENTOLIN HFA) 108 (90 Base) MCG/ACT inhaler Inhale 2 puffs into the lungs every 6 (six) hours as needed for wheezing or shortness of breath. 8 g 6   Budeson-Glycopyrrol-Formoterol (BREZTRI AEROSPHERE) 160-9-4.8 MCG/ACT AERO INHALE 2 PUFFS INTO THE LUNGS IN THE MORNING AND AT BEDTIME 10.7 g 6   cilostazol (PLETAL) 50 MG tablet Take 1 tablet (50 mg total) by mouth 2 (two) times daily. 180 tablet 3   fexofenadine (ALLEGRA) 180 MG tablet Take 180 mg by mouth daily as needed for allergies or rhinitis.     ipratropium-albuterol (DUONEB) 0.5-2.5 (3) MG/3ML SOLN Take 3 mLs by nebulization every 6 (six) hours as needed.     simvastatin (ZOCOR) 20 MG tablet Take 20 mg by mouth every evening.     Spacer/Aero-Holding Chambers (AEROCHAMBER MV) inhaler Use as instructed 1 each 0   traZODone (DESYREL) 50 MG tablet Take 50 mg by mouth at bedtime.   1   sertraline (ZOLOFT) 100 MG tablet Take 100 mg by mouth daily.     No facility-administered medications prior to visit.     Review of Systems:   Constitutional:   No  weight loss, night sweats,  Fevers, chills,  +fatigue, or  lassitude.  HEENT:   No headaches,  Difficulty swallowing,  Tooth/dental problems, or  Sore throat,                No sneezing, itching, ear ache, nasal congestion, post nasal drip,   CV:  No chest pain,  Orthopnea, PND, swelling in lower extremities, anasarca, dizziness, palpitations, syncope.   GI  No heartburn, indigestion, abdominal pain, nausea, vomiting, diarrhea, change in bowel habits, loss of  appetite, bloody stools.   Resp:  No chest wall deformity  Skin: no rash or lesions.  GU: no dysuria, change in color of urine, no urgency or frequency.  No flank pain, no hematuria   MS:  No joint pain or swelling.  No decreased range of motion.  No back pain.    Physical Exam  BP 118/80 (BP Location: Left Arm, Patient Position: Sitting, Cuff Size: Normal)   Pulse 75   Temp (!) 97.4 F (36.3 C) (Oral)   Ht 5\' 6"  (1.676 m)   Wt 176 lb 6.4 oz (80 kg)   SpO2  94%   BMI 28.47 kg/m   GEN: A/Ox3; pleasant , NAD, well nourished    HEENT:  [place]/AT,   NOSE-clear, THROAT-clear, no lesions, no postnasal drip or exudate noted.   NECK:  Supple w/ fair ROM; no JVD; normal carotid impulses w/o bruits; no thyromegaly or nodules palpated; no lymphadenopathy.    RESP  Clear  P & A; w/o, wheezes/ rales/ or rhonchi. no accessory muscle use, no dullness to percussion  CARD:  RRR, no m/r/g, no peripheral edema, pulses intact, no cyanosis or clubbing.  GI:   Soft & nt; nml bowel sounds; no organomegaly or masses detected.   Musco: Warm bil, no deformities or joint swelling noted.   Neuro: alert, no focal deficits noted.    Skin: Warm, no lesions or rashes    Lab Results:  CBC    Component Value Date/Time   WBC 10.2 12/19/2022 1323   WBC 14.9 (H) 10/30/2019 0521   RBC 5.09 12/19/2022 1323   HGB 16.2 12/19/2022 1323   HGB 14.9 07/17/2017 1049   HCT 46.4 12/19/2022 1323   HCT 43.6 07/17/2017 1049   PLT 276 12/19/2022 1323   PLT 282 07/17/2017 1049   MCV 91.2 12/19/2022 1323   MCV 93.8 07/17/2017 1049   MCH 31.8 12/19/2022 1323   MCHC 34.9 12/19/2022 1323   RDW 13.8 12/19/2022 1323   RDW 14.3 07/17/2017 1049   LYMPHSABS 0.9 12/19/2022 1323   LYMPHSABS 0.9 07/17/2017 1049   MONOABS 1.2 (H) 12/19/2022 1323   MONOABS 1.4 (H) 07/17/2017 1049   EOSABS 0.2 12/19/2022 1323   EOSABS 0.3 07/17/2017 1049   BASOSABS 0.1 12/19/2022 1323   BASOSABS 0.0 07/17/2017 1049    BMET     Component Value Date/Time   NA 138 12/19/2022 1323   NA 139 07/17/2017 1049   K 4.2 12/19/2022 1323   K 4.4 07/17/2017 1049   CL 102 12/19/2022 1323   CO2 32 12/19/2022 1323   CO2 29 07/17/2017 1049   GLUCOSE 96 12/19/2022 1323   GLUCOSE 92 07/17/2017 1049   BUN 12 12/19/2022 1323   BUN 14.8 07/17/2017 1049   CREATININE 0.69 12/19/2022 1323   CREATININE 0.8 07/17/2017 1049   CALCIUM 9.2 12/19/2022 1323   CALCIUM 9.3 07/17/2017 1049   GFRNONAA >60 12/19/2022 1323   GFRAA >60 01/27/2020 1124    BNP No results found for: "BNP"  ProBNP No results found for: "PROBNP"  Imaging: No results found.  Administration History     None          Latest Ref Rng & Units 10/24/2019   11:14 AM 11/24/2016   10:25 AM  PFT Results  FVC-Pre L 2.33  1.77   FVC-Predicted Pre % 68  50   FVC-Post L 2.47  1.94   FVC-Predicted Post % 73  55   Pre FEV1/FVC % % 49  50   Post FEV1/FCV % % 49  52   FEV1-Pre L 1.15  0.89   FEV1-Predicted Pre % 48  36   FEV1-Post L 1.21  1.01   DLCO uncorrected ml/min/mmHg 11.49  11.22   DLCO UNC% % 53  41   DLCO corrected ml/min/mmHg 11.28    DLCO COR %Predicted % 52    DLVA Predicted % 76  68   TLC L 5.42  5.54   TLC % Predicted % 86  88   RV % Predicted % 123  138     No results found for: "  NITRICOXIDE"      Assessment & Plan:   COPD (chronic obstructive pulmonary disease) (HCC) Comp stated on triple therapy with Breztri.  Rare albuterol use.  Patient remains independent with mild to moderate activities at home.  Advised to get his fall flu shot.  Discussed RSV vaccine.  Plan  Patient Instructions  Continue on Breztri 2 puffs Twice daily rinse after use.  Albuterol inhaler As needed   Activity as tolerated  Flu shot this fall.  Continue on Oxygen 3l/m At bedtime   CT chest as planned May 2025  Follow up with Icard in 1 year and As needed       Lung nodule Left lower lobe lung nodule stable on serial CT chest.  Most recent CT chest  on Dec 19, 2022 showed a stable 7 mm left lower lobe nodule.  Continue follow-up with oncology  Stage I squamous cell carcinoma of left lung (HCC) History of Lung cancer RUL and Lingula  s/p resection bilaterally.  Surgical changes have been stable on serial CT.  No evidence of recurrence.  Left lower lobe lung nodule is being followed on serial CT.  Nocturnal hypoxemia Continue on oxygen at bedtime-3 L to keep O2 saturations greater than 88 to 90%     Rubye Oaks, NP 04/04/2023

## 2023-04-04 NOTE — Assessment & Plan Note (Signed)
Comp stated on triple therapy with Breztri.  Rare albuterol use.  Patient remains independent with mild to moderate activities at home.  Advised to get his fall flu shot.  Discussed RSV vaccine.  Plan  Patient Instructions  Continue on Breztri 2 puffs Twice daily rinse after use.  Albuterol inhaler As needed   Activity as tolerated  Flu shot this fall.  Continue on Oxygen 3l/m At bedtime   CT chest as planned May 2025  Follow up with Icard in 1 year and As needed

## 2023-04-04 NOTE — Assessment & Plan Note (Addendum)
Continue on oxygen at bedtime-3 L to keep O2 saturations greater than 88 to 90%

## 2023-04-04 NOTE — Assessment & Plan Note (Signed)
History of Lung cancer RUL and Lingula  s/p resection bilaterally.  Surgical changes have been stable on serial CT.  No evidence of recurrence.  Left lower lobe lung nodule is being followed on serial CT.

## 2023-04-11 DIAGNOSIS — H31012 Macula scars of posterior pole (postinflammatory) (post-traumatic), left eye: Secondary | ICD-10-CM | POA: Diagnosis not present

## 2023-04-11 DIAGNOSIS — H35362 Drusen (degenerative) of macula, left eye: Secondary | ICD-10-CM | POA: Diagnosis not present

## 2023-04-11 DIAGNOSIS — L909 Atrophic disorder of skin, unspecified: Secondary | ICD-10-CM | POA: Diagnosis not present

## 2023-04-11 DIAGNOSIS — H25813 Combined forms of age-related cataract, bilateral: Secondary | ICD-10-CM | POA: Diagnosis not present

## 2023-05-22 DIAGNOSIS — G4733 Obstructive sleep apnea (adult) (pediatric): Secondary | ICD-10-CM | POA: Diagnosis not present

## 2023-05-22 DIAGNOSIS — F329 Major depressive disorder, single episode, unspecified: Secondary | ICD-10-CM | POA: Diagnosis not present

## 2023-05-22 DIAGNOSIS — I10 Essential (primary) hypertension: Secondary | ICD-10-CM | POA: Diagnosis not present

## 2023-05-22 DIAGNOSIS — C3490 Malignant neoplasm of unspecified part of unspecified bronchus or lung: Secondary | ICD-10-CM | POA: Diagnosis not present

## 2023-05-22 DIAGNOSIS — Z23 Encounter for immunization: Secondary | ICD-10-CM | POA: Diagnosis not present

## 2023-05-22 DIAGNOSIS — R200 Anesthesia of skin: Secondary | ICD-10-CM | POA: Diagnosis not present

## 2023-05-22 DIAGNOSIS — E785 Hyperlipidemia, unspecified: Secondary | ICD-10-CM | POA: Diagnosis not present

## 2023-05-22 DIAGNOSIS — J441 Chronic obstructive pulmonary disease with (acute) exacerbation: Secondary | ICD-10-CM | POA: Diagnosis not present

## 2023-05-22 DIAGNOSIS — E042 Nontoxic multinodular goiter: Secondary | ICD-10-CM | POA: Diagnosis not present

## 2023-05-22 DIAGNOSIS — R399 Unspecified symptoms and signs involving the genitourinary system: Secondary | ICD-10-CM | POA: Diagnosis not present

## 2023-05-22 DIAGNOSIS — E669 Obesity, unspecified: Secondary | ICD-10-CM | POA: Diagnosis not present

## 2023-05-22 DIAGNOSIS — J309 Allergic rhinitis, unspecified: Secondary | ICD-10-CM | POA: Diagnosis not present

## 2023-06-05 ENCOUNTER — Ambulatory Visit: Payer: Self-pay

## 2023-06-05 NOTE — Patient Outreach (Signed)
Care Coordination   06/05/2023 Name: [name] MRN: [id number] DOB: October 23, 1940   Care Coordination Outreach Attempts:  An unsuccessful telephone outreach was attempted for a scheduled appointment today.  Follow Up Plan:  Additional outreach attempts will be made to offer the patient care coordination information and services.   Encounter Outcome:  No Answer   Care Coordination Interventions:  No, not indicated    Delsa Sale RN BSN CCM [place]  [facility], [facility] Nurse Care Coordinator  Direct Dial: [phone] Website: [email or web address]

## 2023-06-23 ENCOUNTER — Telehealth: Payer: Self-pay | Admitting: *Deleted

## 2023-06-23 NOTE — Progress Notes (Unsigned)
  Care Coordination Note  06/23/2023 Name: ALEXNADER RUCCI MRN: [id number] DOB: 29-Oct-1940  [name] is a 82 y.o. year old male who is a primary care patient of Avva, Ravisankar, MD and is actively engaged with the care management team. I reached out to [name] by phone today to assist with re-scheduling a follow up visit with the RN Case Manager  Follow up plan: Unsuccessful telephone outreach attempt made. A HIPAA compliant phone message was left for the patient providing contact information and requesting a return call.   [facility]  Care Coordination Care Guide  Direct Dial: [phone]

## 2023-06-27 NOTE — Progress Notes (Signed)
  Care Coordination Note  06/27/2023 Name: [name] MRN: [id number] DOB: 1941-04-02  [name] is a 82 y.o. year old male who is a primary care patient of Avva, Ravisankar, MD and is actively engaged with the care management team.  [name] called by phone today to assist with re-scheduling a follow up visit with the RN Case Manager  Follow up plan: Telephone appointment with care management team member scheduled for:07/24/23  [facility] Coordination Care Guide  Direct Dial: [phone]

## 2023-07-24 ENCOUNTER — Ambulatory Visit: Payer: Self-pay

## 2023-07-24 NOTE — Patient Outreach (Signed)
  Care Coordination   07/24/2023 Name: [name] MRN: [id number] DOB: 15-Jun-1941   Care Coordination Outreach Attempts:  An unsuccessful telephone outreach was attempted for a scheduled appointment today.  Follow Up Plan:  Additional outreach attempts will be made to offer the patient care coordination information and services.   Encounter Outcome:  No Answer   Care Coordination Interventions:  No, not indicated    Delsa Sale RN BSN CCM [place]  [facility] Institute, [facility] Nurse Care Coordinator  Direct Dial: [phone] Website: [email or web address]

## 2023-08-24 ENCOUNTER — Ambulatory Visit: Payer: Self-pay

## 2023-08-24 NOTE — Patient Outreach (Signed)
  Care Coordination   Follow Up Visit Note   08/24/2023 Name: [name] MRN: [id number] DOB: 10/12/1940  [name] is a 82 y.o. year old male who sees Avva, Ravisankar, MD for primary care. I spoke with  [name] by phone today.  What matters to the patients health and wellness today?  Patient would like to avoid exposure to extreme cold temperatures to help prevent a COPD flare.     Goals Addressed               This Visit's Progress     Patient Stated     COMPLETED: I struggle with shortness of breath when I try to walk long distances (pt-stated)        Care Coordination Interventions: Provided patient with basic written and verbal COPD education on self care/management/and exacerbation prevention, including the importance of covering mouth and nose when outdoors to prevent breathing in extremely cold air Instructed patient to notify his doctor of any new symptoms or concerns promptly  Advised patient of case closure from nurse care coordination per PCP provider request and patient verbalizes understanding        COMPLETED: I would like to have a follow up Colonoscopy (pt-stated)        Care Coordination Interventions: Evaluation of current treatment plan related to follow up Colonoscopy and patient's adherence to plan as established by provider Determined patient is concerned about not having had a follow up Colonoscopy due to having a history of pre-cancerous polyps Educated patient about Cologaurd, a noninvasive, at-home stool test that screens for colorectal cancer and precancerous polyps in the colon and rectum Encouraged patient to discuss this with his PCP provider at next scheduled visit and ask for additional recommendations, patient verbalizes understanding     Interventions Today    Flowsheet Row Most Recent Value  Chronic Disease   Chronic disease during today's visit Chronic Obstructive Pulmonary Disease (COPD)  General Interventions   General  Interventions Discussed/Reviewed General Interventions Discussed, General Interventions Reviewed, Durable Medical Equipment (DME), Health Screening, Doctor Visits  Doctor Visits Discussed/Reviewed Doctor Visits Discussed, Doctor Visits Reviewed, PCP  Health Screening Colonoscopy  Durable Medical Equipment (DME) Oxygen   Education Interventions   Education Provided Provided Education  Provided Verbal Education On When to see the doctor          SDOH assessments and interventions completed:  Yes  SDOH Interventions Today    Flowsheet Row Most Recent Value  SDOH Interventions   Food Insecurity Interventions Intervention Not Indicated  Housing Interventions Intervention Not Indicated  Transportation Interventions Intervention Not Indicated  Utilities Interventions Intervention Not Indicated        Care Coordination Interventions:  Yes, provided   Follow up plan: No further intervention required.   Encounter Outcome:  Patient Visit Completed

## 2023-08-24 NOTE — Patient Instructions (Signed)
 Visit Information  Thank you for taking time to visit with me today. Please don't hesitate to contact me if I can be of assistance to you.   Following are the goals we discussed today:   Goals Addressed               This Visit's Progress     Patient Stated     COMPLETED: I struggle with shortness of breath when I try to walk long distances (pt-stated)        Care Coordination Interventions: Provided patient with basic written and verbal COPD education on self care/management/and exacerbation prevention, including the importance of covering mouth and nose when outdoors to prevent breathing in extremely cold air Instructed patient to notify his doctor of any new symptoms or concerns promptly  Advised patient of case closure from nurse care coordination per PCP provider request and patient verbalizes understanding        COMPLETED: I would like to have a follow up Colonoscopy (pt-stated)        Care Coordination Interventions: Evaluation of current treatment plan related to follow up Colonoscopy and patient's adherence to plan as established by provider Determined patient is concerned about not having had a follow up Colonoscopy due to having a history of pre-cancerous polyps Educated patient about Cologaurd, a noninvasive, at-home stool test that screens for colorectal cancer and precancerous polyps in the colon and rectum Encouraged patient to discuss this with his PCP provider at next scheduled visit and ask for additional recommendations, patient verbalizes understanding        If you are experiencing a Mental Health or Behavioral Health Crisis or need someone to talk to, please call 1-800-273-TALK (toll free, 24 hour hotline)  Patient verbalizes understanding of instructions and care plan provided today and agrees to view in MyChart. Active MyChart status and patient understanding of how to access instructions and care plan via MyChart confirmed with patient.     David Ly  RN BSN CCM [place]  [facility], [facility] Nurse Care Coordinator  Direct Dial: [phone] Website: [email or web address]

## 2023-11-29 DIAGNOSIS — I10 Essential (primary) hypertension: Secondary | ICD-10-CM | POA: Diagnosis not present

## 2023-11-29 DIAGNOSIS — Z125 Encounter for screening for malignant neoplasm of prostate: Secondary | ICD-10-CM | POA: Diagnosis not present

## 2023-11-29 DIAGNOSIS — E042 Nontoxic multinodular goiter: Secondary | ICD-10-CM | POA: Diagnosis not present

## 2023-11-29 DIAGNOSIS — E785 Hyperlipidemia, unspecified: Secondary | ICD-10-CM | POA: Diagnosis not present

## 2023-12-06 DIAGNOSIS — R82998 Other abnormal findings in urine: Secondary | ICD-10-CM | POA: Diagnosis not present

## 2023-12-06 DIAGNOSIS — I10 Essential (primary) hypertension: Secondary | ICD-10-CM | POA: Diagnosis not present

## 2023-12-06 DIAGNOSIS — C3490 Malignant neoplasm of unspecified part of unspecified bronchus or lung: Secondary | ICD-10-CM | POA: Diagnosis not present

## 2023-12-06 DIAGNOSIS — E669 Obesity, unspecified: Secondary | ICD-10-CM | POA: Diagnosis not present

## 2023-12-06 DIAGNOSIS — E785 Hyperlipidemia, unspecified: Secondary | ICD-10-CM | POA: Diagnosis not present

## 2023-12-06 DIAGNOSIS — R399 Unspecified symptoms and signs involving the genitourinary system: Secondary | ICD-10-CM | POA: Diagnosis not present

## 2023-12-06 DIAGNOSIS — Z1331 Encounter for screening for depression: Secondary | ICD-10-CM | POA: Diagnosis not present

## 2023-12-06 DIAGNOSIS — J441 Chronic obstructive pulmonary disease with (acute) exacerbation: Secondary | ICD-10-CM | POA: Diagnosis not present

## 2023-12-06 DIAGNOSIS — E042 Nontoxic multinodular goiter: Secondary | ICD-10-CM | POA: Diagnosis not present

## 2023-12-06 DIAGNOSIS — Z0000 Encounter for general adult medical examination without abnormal findings: Secondary | ICD-10-CM | POA: Diagnosis not present

## 2023-12-06 DIAGNOSIS — R200 Anesthesia of skin: Secondary | ICD-10-CM | POA: Diagnosis not present

## 2023-12-06 DIAGNOSIS — G4733 Obstructive sleep apnea (adult) (pediatric): Secondary | ICD-10-CM | POA: Diagnosis not present

## 2023-12-06 DIAGNOSIS — Z1339 Encounter for screening examination for other mental health and behavioral disorders: Secondary | ICD-10-CM | POA: Diagnosis not present

## 2023-12-15 DIAGNOSIS — N390 Urinary tract infection, site not specified: Secondary | ICD-10-CM | POA: Diagnosis not present

## 2023-12-19 ENCOUNTER — Other Ambulatory Visit: Payer: Self-pay | Admitting: Medical Oncology

## 2023-12-19 DIAGNOSIS — C3491 Malignant neoplasm of unspecified part of right bronchus or lung: Secondary | ICD-10-CM

## 2023-12-20 ENCOUNTER — Inpatient Hospital Stay: Payer: Medicare Other | Attending: Internal Medicine

## 2023-12-20 ENCOUNTER — Ambulatory Visit (HOSPITAL_COMMUNITY)
Admission: RE | Admit: 2023-12-20 | Discharge: 2023-12-20 | Disposition: A | Discharge status: Home / Self Care | Source: Ambulatory Visit | Attending: Physician Assistant | Admitting: Physician Assistant

## 2023-12-20 DIAGNOSIS — E041 Nontoxic single thyroid nodule: Secondary | ICD-10-CM | POA: Diagnosis not present

## 2023-12-20 DIAGNOSIS — C3412 Malignant neoplasm of upper lobe, left bronchus or lung: Secondary | ICD-10-CM | POA: Diagnosis not present

## 2023-12-20 DIAGNOSIS — R058 Other specified cough: Secondary | ICD-10-CM | POA: Insufficient documentation

## 2023-12-20 DIAGNOSIS — C3491 Malignant neoplasm of unspecified part of right bronchus or lung: Secondary | ICD-10-CM | POA: Insufficient documentation

## 2023-12-20 DIAGNOSIS — J449 Chronic obstructive pulmonary disease, unspecified: Secondary | ICD-10-CM | POA: Insufficient documentation

## 2023-12-20 DIAGNOSIS — Z902 Acquired absence of lung [part of]: Secondary | ICD-10-CM | POA: Diagnosis not present

## 2023-12-20 DIAGNOSIS — I700 Atherosclerosis of aorta: Secondary | ICD-10-CM | POA: Diagnosis not present

## 2023-12-20 DIAGNOSIS — C3411 Malignant neoplasm of upper lobe, right bronchus or lung: Secondary | ICD-10-CM | POA: Diagnosis not present

## 2023-12-20 LAB — CBC WITH DIFFERENTIAL (CANCER CENTER ONLY)
Abs Immature Granulocytes: 0.1 10*3/uL — ABNORMAL HIGH (ref 0.00–0.07)
Basophils Absolute: 0.1 10*3/uL (ref 0.0–0.1)
Basophils Relative: 1 %
Eosinophils Absolute: 0.2 10*3/uL (ref 0.0–0.5)
Eosinophils Relative: 2 %
HCT: 41.7 % (ref 39.0–52.0)
Hemoglobin: 14.7 g/dL (ref 13.0–17.0)
Immature Granulocytes: 1 %
Lymphocytes Relative: 8 %
Lymphs Abs: 0.8 10*3/uL (ref 0.7–4.0)
MCH: 31.2 pg (ref 26.0–34.0)
MCHC: 35.3 g/dL (ref 30.0–36.0)
MCV: 88.5 fL (ref 80.0–100.0)
Monocytes Absolute: 1 10*3/uL (ref 0.1–1.0)
Monocytes Relative: 10 %
Neutro Abs: 7.4 10*3/uL (ref 1.7–7.7)
Neutrophils Relative %: 78 %
Platelet Count: 357 10*3/uL (ref 150–400)
RBC: 4.71 MIL/uL (ref 4.22–5.81)
RDW: 13.7 % (ref 11.5–15.5)
WBC Count: 9.5 10*3/uL (ref 4.0–10.5)
nRBC: 0 % (ref 0.0–0.2)

## 2023-12-20 LAB — CMP (CANCER CENTER ONLY)
ALT: 11 U/L (ref 0–44)
AST: 12 U/L — ABNORMAL LOW (ref 15–41)
Albumin: 3.7 g/dL (ref 3.5–5.0)
Alkaline Phosphatase: 61 U/L (ref 38–126)
Anion gap: 5 (ref 5–15)
BUN: 12 mg/dL (ref 8–23)
CO2: 33 mmol/L — ABNORMAL HIGH (ref 22–32)
Calcium: 9.4 mg/dL (ref 8.9–10.3)
Chloride: 102 mmol/L (ref 98–111)
Creatinine: 0.91 mg/dL (ref 0.61–1.24)
GFR, Estimated: >60 mL/min (ref >60)
Glucose, Bld: 105 mg/dL — ABNORMAL HIGH (ref 70–99)
Potassium: 4.1 mmol/L (ref 3.5–5.1)
Sodium: 140 mmol/L (ref 135–145)
Total Bilirubin: 0.4 mg/dL (ref 0.0–1.2)
Total Protein: 6.7 g/dL (ref 6.5–8.1)

## 2023-12-20 MED ORDER — IOHEXOL 300 MG/ML  SOLN
75.0000 mL | Freq: Once | INTRAMUSCULAR | Status: AC | PRN
  Administered 2023-12-20: 75 mL via INTRAVENOUS

## 2023-12-25 ENCOUNTER — Inpatient Hospital Stay (HOSPITAL_BASED_OUTPATIENT_CLINIC_OR_DEPARTMENT_OTHER): Payer: Medicare Other | Admitting: Internal Medicine

## 2023-12-25 VITALS — BP 160/68 | HR 80 | Temp 98.2°F | Resp 16 | Ht 66.0 in | Wt 171.1 lb

## 2023-12-25 DIAGNOSIS — J449 Chronic obstructive pulmonary disease, unspecified: Secondary | ICD-10-CM | POA: Diagnosis not present

## 2023-12-25 DIAGNOSIS — R058 Other specified cough: Secondary | ICD-10-CM | POA: Diagnosis not present

## 2023-12-25 DIAGNOSIS — C3490 Malignant neoplasm of unspecified part of unspecified bronchus or lung: Secondary | ICD-10-CM

## 2023-12-25 DIAGNOSIS — Z902 Acquired absence of lung [part of]: Secondary | ICD-10-CM | POA: Diagnosis not present

## 2023-12-25 DIAGNOSIS — C3412 Malignant neoplasm of upper lobe, left bronchus or lung: Secondary | ICD-10-CM | POA: Diagnosis not present

## 2023-12-25 NOTE — Progress Notes (Signed)
 [facility] [phone]   [phone]  OFFICE PROGRESS NOTE  David Robins, MD [address] [place] [address]  DIAGNOSIS: Stage IB (T2a, N0, M0) non-small cell lung cancer, poorly differentiated invasive squamous cell carcinoma diagnosed in April 2018  PRIOR THERAPY: Status post left upper lobectomy with lymph node dissection under the care of Dr. Luna Salinas on 12/12/2016 with tumor size of 3.2 cm.  CURRENT THERAPY: Observation.  INTERVAL HISTORY: [name] 83 y.o. male returns to the clinic today for annual follow-up visit. Discussed the use of AI scribe software for clinical note transcription with the patient, who gave verbal consent to proceed.  History of Present Illness   [name] is an 83 year old male with stage IB non-small cell lung cancer who presents for evaluation with repeat CT scan of the chest for restaging of his disease.  He has a history of stage IB non-small cell lung cancer, squamous cell carcinoma, diagnosed in April 2018. He underwent a left upper lobectomy with lymph node dissection and has been on observation since that time.  He experiences difficulty breathing, particularly when bending over, which he attributes to abdominal pressure. He becomes 'out of breath' but notes that it resolves after a few seconds of rest. No chest pain, nausea, vomiting, diarrhea, or headaches.  He has a cough productive of yellow sputum.      MEDICAL HISTORY: Past Medical History:  Diagnosis Date   Anxiety    pt. reports that he " could swing from the ......" because he is so anxious about the surgery    Arthritis    R hand tendonitis    COPD (chronic obstructive pulmonary disease) (HCC)    Depression    Dyspnea    History of kidney stones    Hyperlipidemia    lung ca dx'd 10/2016   Nocturnal hypoxemia 04/04/2023   Pneumonia    Sleep apnea    cannot tolerate cpap    ALLERGIES:  is allergic to nsaids,  gadolinium derivatives, and penicillins.  MEDICATIONS:  Current Outpatient Medications  Medication Sig Dispense Refill   acetaminophen  (TYLENOL ) 325 MG tablet Take 2 tablets (650 mg total) by mouth every 6 (six) hours as needed for mild pain (or Fever >/= 101).     albuterol  (PROVENTIL ) (2.5 MG/3ML) 0.083% nebulizer solution Take 3 mLs (2.5 mg total) by nebulization every 2 (two) hours as needed for wheezing. 75 mL 3   albuterol  (VENTOLIN  HFA) 108 (90 Base) MCG/ACT inhaler Inhale 2 puffs into the lungs every 6 (six) hours as needed for wheezing or shortness of breath. 8 g 6   Budeson-Glycopyrrol-Formoterol  (BREZTRI  AEROSPHERE) 160-9-4.8 MCG/ACT AERO INHALE 2 PUFFS INTO THE LUNGS IN THE MORNING AND AT BEDTIME 10.7 g 6   cilostazol  (PLETAL ) 50 MG tablet Take 1 tablet (50 mg total) by mouth 2 (two) times daily. 180 tablet 3   fexofenadine (ALLEGRA) 180 MG tablet Take 180 mg by mouth daily as needed for allergies or rhinitis.     ipratropium-albuterol  (DUONEB) 0.5-2.5 (3) MG/3ML SOLN Take 3 mLs by nebulization every 6 (six) hours as needed.     sertraline  (ZOLOFT ) 100 MG tablet Take 100 mg by mouth daily.     simvastatin  (ZOCOR ) 20 MG tablet Take 20 mg by mouth every evening.     Spacer/Aero-Holding Chambers (AEROCHAMBER MV) inhaler Use as instructed 1 each 0   traZODone  (DESYREL ) 50 MG tablet Take 50 mg by mouth at  bedtime.   1   No current facility-administered medications for this visit.    SURGICAL HISTORY:  Past Surgical History:  Procedure Laterality Date   COLONOSCOPY W/ POLYPECTOMY     cyst back  2011   CYSTOSCOPY  2012   for bleeding in bladder x 3   INTERCOSTAL NERVE BLOCK Right 10/28/2019   Procedure: Intercostal Nerve Block;  Surgeon: Zelphia Higashi, MD;  Location: [facility] OR;  Service: Thoracic;  Laterality: Right;   MOUTH SURGERY     NODE DISSECTION  10/28/2019   Procedure: Node Dissection;  Surgeon: Zelphia Higashi, MD;  Location: [facility] OR;  Service: Thoracic;;   VIDEO  ASSISTED THORACOSCOPY (VATS)/ LOBECTOMY Left 12/12/2016   Procedure: VIDEO ASSISTED THORACOSCOPY (VATS)/LEFT UPPER LOBECTOMY;  Surgeon: Zelphia Higashi, MD;  Location: [facility] OR;  Service: Thoracic;  Laterality: Left;   VIDEO BRONCHOSCOPY Right 10/28/2019   VIDEO BRONCHOSCOPY WITH ENDOBRONCHIAL NAVIGATION (N/A )    VIDEO BRONCHOSCOPY WITH ENDOBRONCHIAL NAVIGATION N/A 10/28/2019   Procedure: VIDEO BRONCHOSCOPY WITH ENDOBRONCHIAL NAVIGATION;  Surgeon: Zelphia Higashi, MD;  Location: [facility] OR;  Service: Thoracic;  Laterality: N/A;    REVIEW OF SYSTEMS:  A comprehensive review of systems was negative except for: Constitutional: positive for fatigue Respiratory: positive for cough   PHYSICAL EXAMINATION: General appearance: alert, cooperative, fatigued, and no distress Head: Normocephalic, without obvious abnormality, atraumatic Neck: no adenopathy, no carotid bruit, supple, symmetrical, trachea midline, and thyroid  not enlarged, symmetric, no tenderness/mass/nodules Lymph nodes: Cervical, supraclavicular, and axillary nodes normal. Resp: clear to auscultation bilaterally Back: symmetric, no curvature. ROM normal. No CVA tenderness. Cardio: regular rate and rhythm, S1, S2 normal, no murmur, click, rub or gallop GI: soft, non-tender; bowel sounds normal; no masses,  no organomegaly Extremities: extremities normal, atraumatic, no cyanosis or edema  ECOG PERFORMANCE STATUS: 1 - Symptomatic but completely ambulatory  Blood pressure (!) 160/68, pulse 80, temperature 98.2 F (36.8 C), temperature source Temporal, resp. rate 16, height 5\' 6"  (1.676 m), weight 171 lb 1.6 oz (77.6 kg), SpO2 92%.  LABORATORY DATA: Lab Results  Component Value Date   WBC 9.5 12/20/2023   HGB 14.7 12/20/2023   HCT 41.7 12/20/2023   MCV 88.5 12/20/2023   PLT 357 12/20/2023      Chemistry      Component Value Date/Time   NA 140 12/20/2023 1311   NA 139 07/17/2017 1049   K 4.1 12/20/2023 1311   K 4.4  07/17/2017 1049   CL 102 12/20/2023 1311   CO2 33 (H) 12/20/2023 1311   CO2 29 07/17/2017 1049   BUN 12 12/20/2023 1311   BUN 14.8 07/17/2017 1049   CREATININE 0.91 12/20/2023 1311   CREATININE 0.8 07/17/2017 1049      Component Value Date/Time   CALCIUM 9.4 12/20/2023 1311   CALCIUM 9.3 07/17/2017 1049   ALKPHOS 61 12/20/2023 1311   ALKPHOS 81 07/17/2017 1049   AST 12 (L) 12/20/2023 1311   AST 16 07/17/2017 1049   ALT 11 12/20/2023 1311   ALT 22 07/17/2017 1049   BILITOT 0.4 12/20/2023 1311   BILITOT 0.55 07/17/2017 1049       RADIOGRAPHIC STUDIES: CT Chest W Contrast Result Date: 12/25/2023 CLINICAL DATA:  History of squamous cell carcinoma status post left upper lobectomy (2018) and right upper lobe squamous cell carcinoma status post wedge resection (2021). * Tracking Code: BO * EXAM: CT CHEST WITH CONTRAST TECHNIQUE: Multidetector CT imaging of the chest was performed during intravenous contrast administration.  RADIATION DOSE REDUCTION: This exam was performed according to the departmental dose-optimization program which includes automated exposure control, adjustment of the mA and/or kV according to patient size and/or use of iterative reconstruction technique. CONTRAST:  75mL OMNIPAQUE  IOHEXOL  300 MG/ML  SOLN COMPARISON:  CT chest dated 12/19/2022 and multiple priors FINDINGS: Cardiovascular: Normal heart size. No significant pericardial fluid/thickening. Again seen is mildly enlarged main pulmonary artery measuring 3.4 cm. No central pulmonary emboli. Coronary artery calcifications and aortic atherosclerosis. Mediastinum/Nodes: Right thyroid  nodule, previously evaluated. No specific follow-up imaging recommended. Normal esophagus. No pathologically enlarged axillary, supraclavicular, mediastinal, or hilar lymph nodes. Lungs/Pleura: The central airways are patent. Adherent linear secretion within the trachea. Postsurgical changes of the bilateral upper lobes. Unchanged superior  segment left lower lobe nodule measures 8 x 6 mm (7:43). New patchy ground-glass opacities in the right lower lobe. No pneumothorax. No pleural effusion. Upper abdomen: Scattered hyperattenuating areas in the right hepatic lobe, unchanged, likely benign. Musculoskeletal: No acute or abnormal lytic or blastic osseous lesions. IMPRESSION: 1. New patchy ground-glass opacities in the right lower lobe, likely infectious/inflammatory. 2. Unchanged superior segment left lower lobe nodule measures 8 x 6 mm. No new or enlarging pulmonary nodules. 3. Postsurgical changes of the bilateral upper lobes. 4. Aortic Atherosclerosis (ICD10-I70.0). Coronary artery calcifications. Assessment for potential risk factor modification, dietary therapy or pharmacologic therapy may be warranted, if clinically indicated. Electronically Signed   By: Limin  Xu M.D.   On: 12/25/2023 09:18     ASSESSMENT AND PLAN: This is a very pleasant 83 years old white male recently diagnosed with a stage IB non-small cell lung cancer, squamous cell carcinoma presented with left upper lobe lung mass status post left upper lobectomy with lymph node dissection. The tumor size was 3.2 cm. There is no evidence for visceral pleural or lymphovascular invasion. He is currently on observation with no concerning complaints except for mild cough. The patient had repeat CT scan of the chest performed last week.  I personally and independently reviewed the scan and discussed the result with the patient today.  His scan showed no concerning findings for disease recurrence or metastasis. Assessment and Plan    Stage 1B non-small cell lung cancer Stage 1B non-small cell lung cancer, squamous cell carcinoma, diagnosed in April 2018. Status post left upper lobectomy with lymph node dissection. Currently under observation. Recent CT scan of the chest shows an 8 mm nodule in the left lower lobe, unchanged in size. No new nodules or growths. - Schedule follow-up CT  scan in one year  Cough with yellow sputum Reports cough with production of yellow sputum. No associated nausea, vomiting, diarrhea, headaches, or chest pain. The cough may be related to post-nasal drip or a mild respiratory infection, but further evaluation is not indicated at this time given the stability of the lung cancer.    The patient voices understanding of current disease status and treatment options and is in agreement with the current care plan. All questions were answered. The patient knows to call the clinic with any problems, questions or concerns. We can certainly see the patient much sooner if necessary.  Disclaimer: This note was dictated with voice recognition software. Similar sounding words can inadvertently be transcribed and may not be corrected upon review.

## 2024-01-09 ENCOUNTER — Ambulatory Visit
Attending: Thoracic Surgery (Cardiothoracic Vascular Surgery) | Admitting: Thoracic Surgery (Cardiothoracic Vascular Surgery)

## 2024-01-09 VITALS — BP 150/75 | HR 76 | Resp 20 | Ht 66.0 in | Wt 171.0 lb

## 2024-01-09 DIAGNOSIS — C3492 Malignant neoplasm of unspecified part of left bronchus or lung: Secondary | ICD-10-CM | POA: Diagnosis not present

## 2024-01-09 DIAGNOSIS — Z902 Acquired absence of lung [part of]: Secondary | ICD-10-CM | POA: Diagnosis not present

## 2024-01-09 NOTE — Progress Notes (Signed)
 [address]       [name] [address]             [phone]     HPI: [name] returns for a scheduled follow-up visit after previous resection for lung cancer.  [name] is an 83 year old man with a history of smoking (quit 2018), lung cancer, COPD, hyperlipidemia, aortic and coronary atherosclerosis, sleep apnea, pneumonia, thyroid  nodule, and left lower lobe lung nodule.  He had a lingular sparing left upper lobectomy for stage Ib squamous cell carcinoma in 2018.  Later had a wedge resection for right upper lobe squamous cell carcinoma in 2021.  Has a well-circumscribed 8 mm solid nodule in the superior segment of the left lower lobe that is being monitored.  I last saw him in May 2024.  He was doing well at that time.  He recently saw Dr. Marguerita Shih.  CT scan showed no evidence of recurrent disease.  The left lower lobe nodule was stable.  Continues to have some shortness of breath with exertion.  No chest pain, pressure, or tightness associated with that.  Is able to do most everything he wants to do but sometimes has to stop and rest.  Past Medical History:  Diagnosis Date   Anxiety    pt. reports that he " could swing from the ......" because he is so anxious about the surgery    Arthritis    R hand tendonitis    COPD (chronic obstructive pulmonary disease) (HCC)    Depression    Dyspnea    History of kidney stones    Hyperlipidemia    lung ca dx'd 10/2016   Nocturnal hypoxemia 04/04/2023   Pneumonia    Sleep apnea    cannot tolerate cpap      Current Outpatient Medications  Medication Sig Dispense Refill   acetaminophen  (TYLENOL ) 325 MG tablet Take 2 tablets (650 mg total) by mouth every 6 (six) hours as needed for mild pain (or Fever >/= 101).     albuterol  (PROVENTIL ) (2.5 MG/3ML) 0.083% nebulizer solution Take 3 mLs (2.5 mg total) by nebulization every 2 (two) hours as needed for wheezing. 75 mL 3   albuterol  (VENTOLIN  HFA) 108 (90 Base)  MCG/ACT inhaler Inhale 2 puffs into the lungs every 6 (six) hours as needed for wheezing or shortness of breath. 8 g 6   Budeson-Glycopyrrol-Formoterol  (BREZTRI  AEROSPHERE) 160-9-4.8 MCG/ACT AERO INHALE 2 PUFFS INTO THE LUNGS IN THE MORNING AND AT BEDTIME 10.7 g 6   cilostazol  (PLETAL ) 50 MG tablet Take 1 tablet (50 mg total) by mouth 2 (two) times daily. 180 tablet 3   fexofenadine (ALLEGRA) 180 MG tablet Take 180 mg by mouth daily as needed for allergies or rhinitis.     ipratropium-albuterol  (DUONEB) 0.5-2.5 (3) MG/3ML SOLN Take 3 mLs by nebulization every 6 (six) hours as needed.     simvastatin  (ZOCOR ) 20 MG tablet Take 20 mg by mouth every evening.     Spacer/Aero-Holding Chambers (AEROCHAMBER MV) inhaler Use as instructed 1 each 0   traZODone  (DESYREL ) 50 MG tablet Take 50 mg by mouth at bedtime.   1   sertraline  (ZOLOFT ) 100 MG tablet Take 100 mg by mouth daily.     No current facility-administered medications for this visit.    Physical Exam BP (!) 150/75   Pulse 76   Resp 20   Ht 5\' 6"  (1.676 m)   Wt 171 lb (77.6 kg)   SpO2  94% Comment: RA  BMI 27.84 kg/m  83 year old man in no acute distress Alert and oriented x 3 with no focal deficits No cervical supraclavicular adenopathy Cardiac regular rate and rhythm Lungs diminished at left base but otherwise clear  Diagnostic Tests: CT CHEST WITH CONTRAST   TECHNIQUE: Multidetector CT imaging of the chest was performed during intravenous contrast administration.   RADIATION DOSE REDUCTION: This exam was performed according to the departmental dose-optimization program which includes automated exposure control, adjustment of the mA and/or kV according to patient size and/or use of iterative reconstruction technique.   CONTRAST:  75mL OMNIPAQUE  IOHEXOL  300 MG/ML  SOLN   COMPARISON:  CT chest dated 12/19/2022 and multiple priors   FINDINGS: Cardiovascular: Normal heart size. No significant pericardial fluid/thickening.  Again seen is mildly enlarged main pulmonary artery measuring 3.4 cm. No central pulmonary emboli. Coronary artery calcifications and aortic atherosclerosis.   Mediastinum/Nodes: Right thyroid  nodule, previously evaluated. No specific follow-up imaging recommended. Normal esophagus. No pathologically enlarged axillary, supraclavicular, mediastinal, or hilar lymph nodes.   Lungs/Pleura: The central airways are patent. Adherent linear secretion within the trachea. Postsurgical changes of the bilateral upper lobes. Unchanged superior segment left lower lobe nodule measures 8 x 6 mm (7:43). New patchy ground-glass opacities in the right lower lobe. No pneumothorax. No pleural effusion.   Upper abdomen: Scattered hyperattenuating areas in the right hepatic lobe, unchanged, likely benign.   Musculoskeletal: No acute or abnormal lytic or blastic osseous lesions.   IMPRESSION: 1. New patchy ground-glass opacities in the right lower lobe, likely infectious/inflammatory. 2. Unchanged superior segment left lower lobe nodule measures 8 x 6 mm. No new or enlarging pulmonary nodules. 3. Postsurgical changes of the bilateral upper lobes. 4. Aortic Atherosclerosis (ICD10-I70.0). Coronary artery calcifications. Assessment for potential risk factor modification, dietary therapy or pharmacologic therapy may be warranted, if clinically indicated.     Electronically Signed   By: Limin  Xu M.D.   On: 12/25/2023 09:18 I personally reviewed the CT images.  No change in the nodule in the superior segment of the left lower lobe.  Aortic and coronary calcification.  Postsurgical changes.  Patchy groundglass right lower lobe.  Impression: [name] is an 83 year old man with a history of smoking (quit 2018), lung cancer, COPD, hyperlipidemia, aortic and coronary atherosclerosis, sleep apnea, pneumonia, thyroid  nodule, and left lower lobe lung nodule.  He had a lingular sparing left upper lobectomy for  stage Ib squamous cell carcinoma in 2018.  Later had a wedge resection for right upper lobe squamous cell carcinoma in 2021.  Has a well-circumscribed 8 mm solid nodule in the superior segment of the left lower lobe that is being monitored.  Now 7 and 4 years out from lung resections for stage I squamous cell carcinomas.  No evidence of recurrent disease.  Left lower lobe lung nodule-well-circumscribed 7 mm nodule stable.  It has grown a little over the past 7 years as it was about 5 mm in 2018.  Likely a low-grade carcinoid or possibly a hamartoma.  No indication for intervention.  Continue to monitor.  Plan: He will follow-up with Dr. Marguerita Shih in a year with a CT chest. At this point I am going to release him but we will be happy to see him back anytime if I can be of any assistance with his care.   I spent over 20 minutes in review of records, images, and consultation with [name] today.  Zelphia Higashi, MD [facility] [phone]

## 2024-04-11 DIAGNOSIS — N401 Enlarged prostate with lower urinary tract symptoms: Secondary | ICD-10-CM | POA: Diagnosis not present

## 2024-04-11 DIAGNOSIS — R310 Gross hematuria: Secondary | ICD-10-CM | POA: Diagnosis not present

## 2024-04-11 DIAGNOSIS — I10 Essential (primary) hypertension: Secondary | ICD-10-CM | POA: Diagnosis not present

## 2024-04-11 DIAGNOSIS — N390 Urinary tract infection, site not specified: Secondary | ICD-10-CM | POA: Diagnosis not present

## 2024-04-19 DIAGNOSIS — R310 Gross hematuria: Secondary | ICD-10-CM | POA: Diagnosis not present

## 2024-04-19 DIAGNOSIS — N390 Urinary tract infection, site not specified: Secondary | ICD-10-CM | POA: Diagnosis not present

## 2024-04-19 DIAGNOSIS — I10 Essential (primary) hypertension: Secondary | ICD-10-CM | POA: Diagnosis not present

## 2024-04-24 DIAGNOSIS — H35363 Drusen (degenerative) of macula, bilateral: Secondary | ICD-10-CM | POA: Diagnosis not present

## 2024-04-24 DIAGNOSIS — H25813 Combined forms of age-related cataract, bilateral: Secondary | ICD-10-CM | POA: Diagnosis not present

## 2024-06-03 ENCOUNTER — Encounter: Payer: Self-pay | Admitting: Adult Health

## 2024-06-03 ENCOUNTER — Ambulatory Visit: Admitting: Adult Health

## 2024-06-03 VITALS — BP 120/62 | HR 77 | Ht 66.0 in | Wt 172.0 lb

## 2024-06-03 DIAGNOSIS — E041 Nontoxic single thyroid nodule: Secondary | ICD-10-CM

## 2024-06-03 DIAGNOSIS — C3491 Malignant neoplasm of unspecified part of right bronchus or lung: Secondary | ICD-10-CM

## 2024-06-03 DIAGNOSIS — C3492 Malignant neoplasm of unspecified part of left bronchus or lung: Secondary | ICD-10-CM

## 2024-06-03 DIAGNOSIS — R911 Solitary pulmonary nodule: Secondary | ICD-10-CM

## 2024-06-03 DIAGNOSIS — Z23 Encounter for immunization: Secondary | ICD-10-CM

## 2024-06-03 DIAGNOSIS — J439 Emphysema, unspecified: Secondary | ICD-10-CM

## 2024-06-03 DIAGNOSIS — J9611 Chronic respiratory failure with hypoxia: Secondary | ICD-10-CM | POA: Diagnosis not present

## 2024-06-03 MED ORDER — BREZTRI AEROSPHERE 160-9-4.8 MCG/ACT IN AERO: INHALATION_SPRAY | RESPIRATORY_TRACT | Status: AC

## 2024-06-03 MED ORDER — BREZTRI AEROSPHERE 160-9-4.8 MCG/ACT IN AERO: 2.0000 | INHALATION_SPRAY | Freq: Two times a day (BID) | RESPIRATORY_TRACT | 11 refills | Status: AC

## 2024-06-03 NOTE — Progress Notes (Signed)
 @Patient  ID: [name], male    DOB: April 08, 1941, 83 y.o.   MRN: [id number]  Chief Complaint  Patient presents with   Medical Management of Chronic Issues    Referring provider: Avva, Ravisankar, MD  HPI: 84 year old male former smoker followed for COPD and history of lung cancer with stage Ib non-small cell cancer in 2021 status post wedge resection RUL and previous resection in 2018 LUL and chronic respiratory failure on nocturnal oxygen     TEST/EVENTS : Reviewed 06/03/2024 and he is on oxygen  CT chest June 16, 2022 stable bilateral upper lobe wedge resection postop changes stable, so significant change in the left lower lobe nodule measuring 0.8 cm, emphysema   CT chest Dec 19, 2022 emphysema.  Stable surgical changes right upper lobe and left hilum.  7 mm left lower lobe stable nodule   PFT October 24, 2019 FEV1 48%, ratio 49, FVC 68%, no significant bronchodilator response, mid flow reversibility, DLCO 53%.  Discussed the use of AI scribe software for clinical note transcription with the patient, who gave verbal consent to proceed.  History of Present Illness [name] is an 83 year old male with COPD who presents for a one-year follow-up.  He continues to manage his COPD with Breztri , taking two puffs in the morning and two puffs at night, and ensures to rinse well afterwards. . He experiences difficulty breathing in hot weather but remains active by walking his dog.  Denies any flare of cough or wheezing.  No recent antibiotic use.  He has a history of lung cancer with lung resection x 2 in 2018 and 2021.  A CT scan in May 2025 was performed to monitor the nodules. He is scheduled for another scan in May 2026. Followed by Oncology.   Remains on oxygen  3 L at bedtime.  Says he is doing well on oxygen .  Would like to get his flu shot today.  In 2023, he had a thyroid  biopsy with atypical cells on biopsy. [name] He has not yet scheduled a follow-up with Dr. Eletha for  annual ultrasounds.     Allergies  Allergen Reactions   Nsaids Other (See Comments)    MD RESTRICTS USE OF ANY BLOOD THINING MEDS [ DUE TO GROWTH IN BLADDER ]    Gadolinium Derivatives Nausea And Vomiting and Other (See Comments)    CHILLS SHAKING FEELING COLD   Penicillins Other (See Comments)    Feels like my skin is crawling  Has patient had a PCN reaction causing immediate rash, facial/tongue/throat swelling, SOB or lightheadedness with hypotension: No Has patient had a PCN reaction causing severe rash involving mucus membranes or skin necrosis: No Has patient had a PCN reaction that required hospitalization No Has patient had a PCN reaction occurring within the last 10 years: No If all of the above answers are NO, then may proceed with Cephalosporin use.     Immunization History  Administered Date(s) Administered   Influenza,inj,Quad PF,6+ Mos 05/03/2013   PFIZER(Purple Top)SARS-COV-2 Vaccination 09/05/2019, 09/26/2019   Pneumococcal Polysaccharide-23 07/05/2002, 11/05/2021   Td (Adult) 02/22/2010   Td (Adult),5 Lf Tetanus Toxid, Preservative Free 06/29/2001, 03/14/2013   Zoster, Live 03/26/2012    Past Medical History:  Diagnosis Date   Anxiety    pt. reports that he  could swing from the ...SABRASABRASABRA because he is so anxious about the surgery    Arthritis    R hand tendonitis    COPD (chronic obstructive pulmonary disease) (HCC)  Depression    Dyspnea    History of kidney stones    Hyperlipidemia    lung ca dx'd 10/2016   Nocturnal hypoxemia 04/04/2023   Pneumonia    Sleep apnea    cannot tolerate cpap    Tobacco History: Social History   Tobacco Use  Smoking Status Former   Current packs/day: 0.00   Average packs/day: 1.5 packs/day for 65.2 years (97.9 ttl pk-yrs)   Types: Cigarettes   Start date: 08/15/1950   Quit date: 11/14/2015   Years since quitting: 8.5  Smokeless Tobacco Never   Counseling given: Not Answered   Outpatient Medications  Prior to Visit  Medication Sig Dispense Refill   acetaminophen  (TYLENOL ) 325 MG tablet Take 2 tablets (650 mg total) by mouth every 6 (six) hours as needed for mild pain (or Fever >/= 101).     albuterol  (PROVENTIL ) (2.5 MG/3ML) 0.083% nebulizer solution Take 3 mLs (2.5 mg total) by nebulization every 2 (two) hours as needed for wheezing. 75 mL 3   albuterol  (VENTOLIN  HFA) 108 (90 Base) MCG/ACT inhaler Inhale 2 puffs into the lungs every 6 (six) hours as needed for wheezing or shortness of breath. 8 g 6   Budeson-Glycopyrrol-Formoterol  (BREZTRI  AEROSPHERE) 160-9-4.8 MCG/ACT AERO INHALE 2 PUFFS INTO THE LUNGS IN THE MORNING AND AT BEDTIME 10.7 g 6   cilostazol  (PLETAL ) 50 MG tablet Take 1 tablet (50 mg total) by mouth 2 (two) times daily. 180 tablet 3   fexofenadine (ALLEGRA) 180 MG tablet Take 180 mg by mouth daily as needed for allergies or rhinitis.     ipratropium-albuterol  (DUONEB) 0.5-2.5 (3) MG/3ML SOLN Take 3 mLs by nebulization every 6 (six) hours as needed.     simvastatin  (ZOCOR ) 20 MG tablet Take 20 mg by mouth every evening.     Spacer/Aero-Holding Chambers (AEROCHAMBER MV) inhaler Use as instructed 1 each 0   traZODone  (DESYREL ) 50 MG tablet Take 50 mg by mouth at bedtime.   1   sertraline  (ZOLOFT ) 100 MG tablet Take 100 mg by mouth daily.     No facility-administered medications prior to visit.     Review of Systems:   Constitutional:   No  weight loss, night sweats,  Fevers, chills, +fatigue, or  lassitude.  HEENT:   No headaches,  Difficulty swallowing,  Tooth/dental problems, or  Sore throat,                No sneezing, itching, ear ache, nasal congestion, post nasal drip,   CV:  No chest pain,  Orthopnea, PND, swelling in lower extremities, anasarca, dizziness, palpitations, syncope.   GI  No heartburn, indigestion, abdominal pain, nausea, vomiting, diarrhea, change in bowel habits, loss of appetite, bloody stools.   Resp:   No chest wall deformity  Skin: no rash or  lesions.  GU: no dysuria, change in color of urine, no urgency or frequency.  No flank pain, no hematuria   MS:  No joint pain or swelling.  No decreased range of motion.  No back pain.    Physical Exam  BP 120/62   Pulse 77   Ht 5' 6 (1.676 m)   Wt 172 lb (78 kg)   SpO2 97%   BMI 27.76 kg/m   GEN: A/Ox3; pleasant , NAD, well nourished    HEENT:  [place]/AT,  NOSE-clear, THROAT-clear, no lesions, no postnasal drip or exudate noted.   NECK:  Supple w/ fair ROM; no JVD; normal carotid impulses w/o bruits; no thyromegaly or  nodules palpated; no lymphadenopathy.    RESP  Clear  P & A; w/o, wheezes/ rales/ or rhonchi. no accessory muscle use, no dullness to percussion  CARD:  RRR, no m/r/g, no peripheral edema, pulses intact, no cyanosis or clubbing.  GI:   Soft & nt; nml bowel sounds; no organomegaly or masses detected.   Musco: Warm bil, no deformities or joint swelling noted.   Neuro: alert, no focal deficits noted.    Skin: Warm, no lesions or rashes    Lab Results:  CBC  BNP No results found for: BNP  ProBNP No results found for: PROBNP  Imaging: No results found.  Administration History     None          Latest Ref Rng & Units 10/24/2019   11:14 AM 11/24/2016   10:25 AM  PFT Results  FVC-Pre L 2.33  1.77   FVC-Predicted Pre % 68  50   FVC-Post L 2.47  1.94   FVC-Predicted Post % 73  55   Pre FEV1/FVC % % 49  50   Post FEV1/FCV % % 49  52   FEV1-Pre L 1.15  0.89   FEV1-Predicted Pre % 48  36   FEV1-Post L 1.21  1.01   DLCO uncorrected ml/min/mmHg 11.49  11.22   DLCO UNC% % 53  41   DLCO corrected ml/min/mmHg 11.28    DLCO COR %Predicted % 52    DLVA Predicted % 76  68   TLC L 5.42  5.54   TLC % Predicted % 86  88   RV % Predicted % 123  138     No results found for: NITRICOXIDE      Assessment & Plan:   Assessment and Plan Assessment & Plan Chronic obstructive pulmonary disease (COPD)  -controlled on current regimen COPD is  well-managed on triple therapy with Breztri . There have been no recent exacerbations requiring antibiotics or steroids. He continues to use oxygen  at 3 liters at bedtime  Continue Breztri , 2 puffs in the morning and 2 puffs at night, ensuring to rinse mouth after use.  Administer flu shot today and provide samples of Breztri .  Lung cancer, status post left upper lobectomy and right upper lobe resection, with pulmonary nodule under surveillance   Lung cancer with previous surgeries in 2018 and 2021. The current pulmonary nodule under surveillance showed no growth on recent CT scan. Follow-up with oncology is scheduled for May 2026. Continue annual CT scans with oncology follow-up in May 2026.  Thyroid  nodule with atypical cells, under surveillance   A thyroid  nodule with atypical cells was identified in 2023. Biopsy showed atypical cells but no cancer. Follow-up with Dr. Gerkin is recommended for annual ultrasound surveillance. Follow up with Dr. Eletha for thyroid  nodule surveillance and schedule annual ultrasound.  Chronic respiratory failure continue on oxygen  at bedtime 3 L.  Appears compensated   Plan  Patient Instructions  Continue on Breztri  2 puffs Twice daily rinse after use.  Albuterol  inhaler As needed   Activity as tolerated  Flu shot today.  Continue on Oxygen  3l/m At bedtime   Follow up with Dr. Eletha for thyroid  nodules [phone]) CT chest in May as planned with Oncology  Follow up with Oncology as planned.  Follow up with Dr Zaida or Shakiyla Kook NP in 1 year and As needed  (30 min slot)       Madelin Stank, NP 06/03/2024

## 2024-06-03 NOTE — Patient Instructions (Addendum)
 Continue on Breztri  2 puffs Twice daily rinse after use.  Albuterol  inhaler As needed   Activity as tolerated  Flu shot today.  Continue on Oxygen  3l/m At bedtime   Follow up with Dr. Eletha for thyroid  nodules [phone]) CT chest in May as planned with Oncology  Follow up with Oncology as planned.  Follow up with Dr Zaida or Buell Parcel NP in 1 year and As needed  (30 min slot)

## 2024-06-04 DIAGNOSIS — E041 Nontoxic single thyroid nodule: Secondary | ICD-10-CM | POA: Diagnosis not present

## 2024-06-04 DIAGNOSIS — R200 Anesthesia of skin: Secondary | ICD-10-CM | POA: Diagnosis not present

## 2024-06-04 DIAGNOSIS — N401 Enlarged prostate with lower urinary tract symptoms: Secondary | ICD-10-CM | POA: Diagnosis not present

## 2024-06-04 DIAGNOSIS — C3490 Malignant neoplasm of unspecified part of unspecified bronchus or lung: Secondary | ICD-10-CM | POA: Diagnosis not present

## 2024-06-04 DIAGNOSIS — J42 Unspecified chronic bronchitis: Secondary | ICD-10-CM | POA: Diagnosis not present

## 2024-06-04 DIAGNOSIS — E785 Hyperlipidemia, unspecified: Secondary | ICD-10-CM | POA: Diagnosis not present

## 2024-06-04 DIAGNOSIS — J9611 Chronic respiratory failure with hypoxia: Secondary | ICD-10-CM | POA: Diagnosis not present

## 2024-06-04 DIAGNOSIS — I10 Essential (primary) hypertension: Secondary | ICD-10-CM | POA: Diagnosis not present

## 2024-06-04 DIAGNOSIS — N390 Urinary tract infection, site not specified: Secondary | ICD-10-CM | POA: Diagnosis not present

## 2024-06-04 DIAGNOSIS — E042 Nontoxic multinodular goiter: Secondary | ICD-10-CM | POA: Diagnosis not present

## 2024-06-04 DIAGNOSIS — E669 Obesity, unspecified: Secondary | ICD-10-CM | POA: Diagnosis not present

## 2024-06-04 DIAGNOSIS — F329 Major depressive disorder, single episode, unspecified: Secondary | ICD-10-CM | POA: Diagnosis not present

## 2024-12-24 ENCOUNTER — Inpatient Hospital Stay

## 2024-12-31 ENCOUNTER — Inpatient Hospital Stay: Admitting: Internal Medicine
